# Patient Record
Sex: Female | Born: 1965 | Race: Black or African American | Hispanic: No | Marital: Single | Smoking: Never smoker
Health system: Southern US, Community
[De-identification: ages and names within clinical notes are randomized; demographics above are authoritative.]

## PROBLEM LIST (undated history)

## (undated) DIAGNOSIS — E119 Type 2 diabetes mellitus without complications: Secondary | ICD-10-CM

## (undated) DIAGNOSIS — D649 Anemia, unspecified: Secondary | ICD-10-CM

## (undated) DIAGNOSIS — C801 Malignant (primary) neoplasm, unspecified: Secondary | ICD-10-CM

## (undated) HISTORY — PX: CHOLECYSTECTOMY: SHX55

---

## 2018-05-13 ENCOUNTER — Ambulatory Visit (INDEPENDENT_AMBULATORY_CARE_PROVIDER_SITE_OTHER): Payer: Self-pay | Admitting: Family Medicine

## 2018-06-04 ENCOUNTER — Encounter (INDEPENDENT_AMBULATORY_CARE_PROVIDER_SITE_OTHER): Payer: Self-pay | Admitting: Primary Care

## 2018-06-04 ENCOUNTER — Ambulatory Visit (INDEPENDENT_AMBULATORY_CARE_PROVIDER_SITE_OTHER): Payer: Self-pay | Admitting: Primary Care

## 2018-06-04 ENCOUNTER — Other Ambulatory Visit: Payer: Self-pay

## 2018-06-04 VITALS — BP 134/75 | HR 96 | Temp 101.8°F | Wt 322.2 lb

## 2018-06-04 DIAGNOSIS — E119 Type 2 diabetes mellitus without complications: Secondary | ICD-10-CM

## 2018-06-04 DIAGNOSIS — J069 Acute upper respiratory infection, unspecified: Secondary | ICD-10-CM

## 2018-06-04 DIAGNOSIS — J320 Chronic maxillary sinusitis: Secondary | ICD-10-CM

## 2018-06-04 DIAGNOSIS — I10 Essential (primary) hypertension: Secondary | ICD-10-CM

## 2018-06-04 DIAGNOSIS — E785 Hyperlipidemia, unspecified: Secondary | ICD-10-CM

## 2018-06-04 LAB — POCT GLYCOSYLATED HEMOGLOBIN (HGB A1C): Hemoglobin A1C: 6.8 % — AB (ref 4.0–5.6)

## 2018-06-04 LAB — GLUCOSE, POCT (MANUAL RESULT ENTRY): POC Glucose: 121 mg/dl — AB (ref 70–99)

## 2018-06-04 MED ORDER — METFORMIN HCL ER 500 MG PO TB24: 500.0000 mg | ORAL_TABLET | Freq: Two times a day (BID) | ORAL | 3 refills | Status: DC

## 2018-06-04 MED ORDER — LOSARTAN POTASSIUM 25 MG PO TABS: 25.0000 mg | ORAL_TABLET | Freq: Every day | ORAL | 3 refills | Status: DC

## 2018-06-04 MED ORDER — AZITHROMYCIN 250 MG PO TABS: ORAL_TABLET | ORAL | 0 refills | Status: DC

## 2018-06-04 MED ORDER — GLIPIZIDE ER 5 MG PO TB24: 5.0000 mg | ORAL_TABLET | Freq: Two times a day (BID) | ORAL | 3 refills | Status: DC

## 2018-06-04 MED ORDER — SIMVASTATIN 20 MG PO TABS: 20.0000 mg | ORAL_TABLET | Freq: Every day | ORAL | 3 refills | Status: DC

## 2018-06-04 NOTE — Progress Notes (Signed)
Acute Office Visit  Subjective:    Patient ID: Tammy Marks, female    DOB: 1965-04-13, 53 y.o.   MRN: [id number]  Chief Complaint  Patient presents with  . New Patient (Initial Visit)    HPI. Tammy Marks is in today to establish care. She presents with fever 101.8 , cold, cough and fatigue. She denies being around anyone that has been sick. These s/s started 3 days ago. Likely viral infection upper respiratory or sinus. Past medical hx, T2D, morbid obesity , HTN, and hyperlipidemia.        No past medical history on file.    No family history on file.  Social History   Socioeconomic History  . Marital status: Single    Spouse name: Not on file  . Number of children: Not on file  . Years of education: Not on file  . Highest education level: Not on file  Occupational History  . Not on file  Social Needs  . Financial resource strain: Not on file  . Food insecurity:    Worry: Not on file    Inability: Not on file  . Transportation needs:    Medical: Not on file    Non-medical: Not on file  Tobacco Use  . Smoking status: Never Smoker  . Smokeless tobacco: Never Used  Substance and Sexual Activity  . Alcohol use: Not Currently  . Drug use: Never  . Sexual activity: Not on file  Lifestyle  . Physical activity:    Days per week: Not on file    Minutes per session: Not on file  . Stress: Not on file  Relationships  . Social connections:    Talks on phone: Not on file    Gets together: Not on file    Attends religious service: Not on file    Active member of club or organization: Not on file    Attends meetings of clubs or organizations: Not on file    Relationship status: Not on file  . Intimate partner violence:    Fear of current or ex partner: Not on file    Emotionally abused: Not on file    Physically abused: Not on file    Forced sexual activity: Not on file  Other Topics Concern  . Not on file  Social History Narrative  . Not on file     Outpatient Medications Prior to Visit  Medication Sig Dispense Refill  . losartan (COZAAR) 25 MG tablet Take 25 mg by mouth daily.    . simvastatin (ZOCOR) 20 MG tablet Take 20 mg by mouth at bedtime.    Marland Kitchen glipiZIDE (GLUCOTROL XL) 5 MG 24 hr tablet Take 5 mg by mouth 2 (two) times daily.    . metFORMIN (GLUCOPHAGE-XR) 500 MG 24 hr tablet Take 500 mg by mouth 2 (two) times daily.     No facility-administered medications prior to visit.     No Known Allergies  Review of Systems  Constitutional: Positive for chills and fever.  HENT: Positive for congestion and sinus pain.   Eyes: Negative.   Respiratory: Positive for cough and sputum production.   Cardiovascular: Negative.   Gastrointestinal: Negative.   Genitourinary: Positive for urgency.  Musculoskeletal: Negative.   Skin: Negative.   Neurological: Positive for weakness and headaches.  Endo/Heme/Allergies: Negative.   Psychiatric/Behavioral: The patient has insomnia.        Objective:    Physical Exam  Constitutional: She is oriented to person, place, and time. She  appears well-developed and well-nourished.  HENT:  Head: Normocephalic and atraumatic.  Tender bilateral maxillary and  cervical chain lymphoids   Eyes: Pupils are equal, round, and reactive to light. EOM are normal.  Neck: Normal range of motion. Neck supple.  Snores   Cardiovascular: Normal rate and regular rhythm.  Pulmonary/Chest: Effort normal and breath sounds normal.  Abdominal: Soft. Bowel sounds are normal.  Musculoskeletal: Normal range of motion.  Neurological: She is alert and oriented to person, place, and time.  Skin: Skin is warm and dry.  Psychiatric: She has a normal mood and affect.    BP 134/75 (BP Location: Left Arm, Patient Position: Sitting, Cuff Size: Large)   Pulse 96   Temp (!) 101.8 F (38.8 C) (Oral)   Wt (!) 322 lb 3.2 oz (146.1 kg)   LMP 05/05/2018 (Exact Date)   SpO2 94%  Wt Readings from Last 3 Encounters:   06/04/18 (!) 322 lb 3.2 oz (146.1 kg)    Health Maintenance Due  Topic Date Due  . HIV Screening  02/03/1983  . TETANUS/TDAP  02/03/1987  . PAP SMEAR-Modifier  02/02/1989  . MAMMOGRAM  02/02/2018  . COLONOSCOPY  02/02/2018    There are no preventive care reminders to display for this patient.  Lab Results  Component Value Date   HGBA1C 6.8 (A) 06/04/2018       Assessment & Plan:  Tammy Marks was seen today for new patient (initial visit).  Diagnoses and all orders for this visit:  Type 2 diabetes mellitus without complication, without long-term current use of insulin (HCC) -     HgB A1c -     Glucose (CBG) -     glipiZIDE (GLUCOTROL XL) 5 MG 24 hr tablet; Take 1 tablet (5 mg total) by mouth 2 (two) times daily for 30 days. -     metFORMIN (GLUCOPHAGE-XR) 500 MG 24 hr tablet; Take 1 tablet (500 mg total) by mouth 2 (two) times daily for 30 days.  Essential hypertension -     glipiZIDE (GLUCOTROL XL) 5 MG 24 hr tablet; Take 1 tablet (5 mg total) by mouth 2 (two) times daily for 30 days. -     losartan (COZAAR) 25 MG tablet; Take 1 tablet (25 mg total) by mouth daily for 30 days.  Hyperlipidemia, unspecified hyperlipidemia type -     simvastatin (ZOCOR) 20 MG tablet; Take 1 tablet (20 mg total) by mouth at bedtime for 30 days.  Acute upper respiratory infection  Morbid obesity (HCC)  Chronic maxillary sinusitis -     azithromycin (ZITHROMAX) 250 MG tablet; Take 2 tablets 1st day than for the following 5 days take 1 tablet   Problem List Items Addressed This Visit    None    Visit Diagnoses    Type 2 diabetes mellitus without complication, without long-term current use of insulin (HCC)    -  Primary A1C 6.8 controlled on oral agents refill    Relevant Medications   losartan (COZAAR) 25 MG tablet   simvastatin (ZOCOR) 20 MG tablet   metFORMIN (GLUCOPHAGE-XR) 500 MG 24 hr tablet   glipiZIDE (GLUCOTROL XL) 5 MG 24 hr tablet   Other Relevant Orders   HgB A1c (Completed)    Glucose (CBG) (Completed)  t:Essential hypertension currently on and forgot to take prior to a-     losartan (COZAAR) 25 MG tablet; Take 1 tablet (25 mg total) by mouth daily for 30 days.  Hyperlipidemia, unspecified hyperlipidemia type -  simvastatin (ZOCOR) 20 MG tablet; Take 1 tablet (20 mg total) by mouth at bedtime for 30 days.  Acute upper respiratory infection : lungs CTA congestion fever and tender swollen lymphoids   Morbid obesity (HCC) BMI> 40 discuss on return visit how we can approach weight loss  Chronic maxillary sinusitis -     azithromycin (ZITHROMAX) 250 MG tablet; Take 2 tablets 1st day than for the following 5 days take 1 tablet      Meds ordered this encounter  Medications  . glipiZIDE (GLUCOTROL XL) 5 MG 24 hr tablet    Sig: Take 1 tablet (5 mg total) by mouth 2 (two) times daily for 30 days.    Dispense:  60 tablet    Refill:  3  . losartan (COZAAR) 25 MG tablet    Sig: Take 1 tablet (25 mg total) by mouth daily for 30 days.    Dispense:  30 tablet    Refill:  3  . metFORMIN (GLUCOPHAGE-XR) 500 MG 24 hr tablet    Sig: Take 1 tablet (500 mg total) by mouth 2 (two) times daily for 30 days.    Dispense:  60 tablet    Refill:  3  . simvastatin (ZOCOR) 20 MG tablet    Sig: Take 1 tablet (20 mg total) by mouth at bedtime for 30 days.    Dispense:  30 tablet    Refill:  3  . azithromycin (ZITHROMAX) 250 MG tablet    Sig: Take 2 tablets 1st day than for the following 5 days take 1 tablet    Dispense:  6 tablet    Refill:  0     Grayce Sessions, NP

## 2018-06-04 NOTE — Patient Instructions (Signed)

## 2018-07-02 ENCOUNTER — Ambulatory Visit (INDEPENDENT_AMBULATORY_CARE_PROVIDER_SITE_OTHER): Payer: Self-pay | Admitting: Primary Care

## 2018-07-24 ENCOUNTER — Telehealth: Payer: Self-pay | Admitting: *Deleted

## 2018-07-24 NOTE — Telephone Encounter (Signed)
Medical Assistant left message on patient's home and cell voicemail. Voicemail states to give a call back to [place] with [facility] at [phone]. Patient is to be a tele visit. Patient had A1C in February.

## 2018-07-28 ENCOUNTER — Ambulatory Visit: Payer: Self-pay | Attending: Primary Care | Admitting: Primary Care

## 2018-07-28 ENCOUNTER — Ambulatory Visit (INDEPENDENT_AMBULATORY_CARE_PROVIDER_SITE_OTHER): Payer: Self-pay | Admitting: Primary Care

## 2018-07-28 ENCOUNTER — Encounter: Payer: Self-pay | Admitting: Primary Care

## 2018-07-28 ENCOUNTER — Other Ambulatory Visit: Payer: Self-pay

## 2018-07-28 DIAGNOSIS — E785 Hyperlipidemia, unspecified: Secondary | ICD-10-CM

## 2018-07-28 DIAGNOSIS — E119 Type 2 diabetes mellitus without complications: Secondary | ICD-10-CM

## 2018-07-28 DIAGNOSIS — I10 Essential (primary) hypertension: Secondary | ICD-10-CM

## 2018-07-28 MED ORDER — GLIPIZIDE ER 5 MG PO TB24: 5.0000 mg | ORAL_TABLET | Freq: Two times a day (BID) | ORAL | 3 refills | Status: DC

## 2018-07-28 MED ORDER — METFORMIN HCL ER 500 MG PO TB24: 500.0000 mg | ORAL_TABLET | Freq: Two times a day (BID) | ORAL | 3 refills | Status: DC

## 2018-07-28 MED ORDER — LOSARTAN POTASSIUM 25 MG PO TABS: 25.0000 mg | ORAL_TABLET | Freq: Every day | ORAL | 3 refills | Status: DC

## 2018-07-28 MED ORDER — SIMVASTATIN 20 MG PO TABS: 20.0000 mg | ORAL_TABLET | Freq: Every day | ORAL | 3 refills | Status: DC

## 2018-07-28 NOTE — Progress Notes (Signed)
Established Patient Office Visit  Subjective: HP  Patient ID: Tammy Marks, female    DOB: Aug 30, 1965  Age: 53 y.o. MRN   HPI: Tammy Marks is following up with DM,HTN and hyperlipidemia.  She will be in for labs in the near future. She voices no problems or concerns. I did mentioned to be safe when in public wear mask and use social distancing.  History reviewed. No pertinent past medical history.  History reviewed. No pertinent surgical history.  History reviewed. No pertinent family history.  Social History   Socioeconomic History  . Marital status: Single    Spouse name: Not on file  . Number of children: Not on file  . Years of education: Not on file  . Highest education level: Not on file  Occupational History  . Not on file  Social Needs  . Financial resource strain: Not on file  . Food insecurity:    Worry: Not on file    Inability: Not on file  . Transportation needs:    Medical: Not on file    Non-medical: Not on file  Tobacco Use  . Smoking status: Never Smoker  . Smokeless tobacco: Never Used  Substance and Sexual Activity  . Alcohol use: Not Currently  . Drug use: Never  . Sexual activity: Not Currently  Lifestyle  . Physical activity:    Days per week: Not on file    Minutes per session: Not on file  . Stress: Not on file  Relationships  . Social connections:    Talks on phone: Not on file    Gets together: Not on file    Attends religious service: Not on file    Active member of club or organization: Not on file    Attends meetings of clubs or organizations: Not on file    Relationship status: Not on file  . Intimate partner violence:    Fear of current or ex partner: Not on file    Emotionally abused: Not on file    Physically abused: Not on file    Forced sexual activity: Not on file  Other Topics Concern  . Not on file  Social History Narrative  . Not on file    Outpatient Medications Prior to Visit  Medication Sig Dispense  Refill  . glipiZIDE (GLUCOTROL XL) 5 MG 24 hr tablet Take 1 tablet (5 mg total) by mouth 2 (two) times daily for 30 days. 60 tablet 3  . losartan (COZAAR) 25 MG tablet Take 1 tablet (25 mg total) by mouth daily for 30 days. 30 tablet 3  . metFORMIN (GLUCOPHAGE-XR) 500 MG 24 hr tablet Take 1 tablet (500 mg total) by mouth 2 (two) times daily for 30 days. 60 tablet 3  . simvastatin (ZOCOR) 20 MG tablet Take 1 tablet (20 mg total) by mouth at bedtime for 30 days. 30 tablet 3  . azithromycin (ZITHROMAX) 250 MG tablet Take 2 tablets 1st day than for the following 5 days take 1 tablet 6 tablet 0   No facility-administered medications prior to visit.     No Known Allergies  ROS Review of Systems    Objective:    Physical Exam  LMP 06/27/2018  Wt Readings from Last 3 Encounters:  06/04/18 (!) 322 lb 3.2 oz (146.1 kg)     Health Maintenance Due  Topic Date Due  . URINE MICROALBUMIN  02/02/1978  . HIV Screening  02/03/1983  . TETANUS/TDAP  02/03/1987  . PAP SMEAR-Modifier  02/02/1989  .  MAMMOGRAM  02/02/2018  . COLONOSCOPY  02/02/2018    There are no preventive care reminders to display for this patient.  No results found for: TSH No results found for: WBC, HGB, HCT, MCV, PLT No results found for: NA, K, CHLORIDE, CO2, GLUCOSE, BUN, CREATININE, BILITOT, ALKPHOS, AST, ALT, PROT, ALBUMIN, CALCIUM, ANIONGAP, EGFR, GFR No results found for: CHOL No results found for: HDL No results found for: LDLCALC No results found for: TRIG No results found for: CHOLHDL Lab Results  Component Value Date   HGBA1C 6.8 (A) 06/04/2018      Assessment & Plan:   Problem List Items Addressed This Visit    None      No orders of the defined types were placed in this encounter.   Follow-up: No follow-ups on file.    Kerin Perna, NP

## 2018-07-28 NOTE — Progress Notes (Signed)
Virtual Visit via Telephone Note  I connected with Tammy Marks on 07/28/18 at 8:40 by telephone and verified that I am speaking with the correct person using two identifiers.   I discussed the limitations, risks, security and privacy concerns of performing an evaluation and management service by telephone and the availability of in person appointments. I also discussed with the patient that there may be a patient responsible charge related to this service. The patient expressed understanding and agreed to proceed.  History of Present Illness: Tammy Marks is a follow up for on  Diabetes management . She voices no concerns or complaints. She is currently on oral diabetic medication.   Observations/Objective: Review of Systems  Constitutional: Negative.   HENT: Negative.   Eyes: Negative.   Respiratory: Negative.   Cardiovascular: Negative.   Gastrointestinal: Negative.   Genitourinary: Negative.   Musculoskeletal: Negative.   Skin: Negative.   Neurological: Negative.   Endo/Heme/Allergies: Negative.   Psychiatric/Behavioral: Negative.     Assessment and Plan:  Tammy Marks was seen today for follow-up.  Diagnoses and all orders for this visit:  Type 2 diabetes mellitus without complication, without long-term current use of insulin (HCC) Diabetes  She presents for her follow-up diabetic visit. She has type 2 diabetes mellitus. No MedicAlert identification noted. There are no hypoglycemic associated symptoms. There are no diabetic associated symptoms. There are no hypoglycemic complications. Symptoms are improving. There are no diabetic complications. Risk factors for coronary artery disease include diabetes mellitus, dyslipidemia and hypertension. Current diabetic treatment includes diet and oral agent (dual therapy). She is compliant with treatment all of the time. Her weight is stable. She is following a diabetic, low fat/cholesterol and low salt diet. Meal planning includes carbohydrate  counting. She has not had a previous visit with a dietitian. She participates in exercise intermittently. There is no change in her home blood glucose trend. An ACE inhibitor/angiotensin II receptor blocker is being taken. She does not see a podiatrist.Eye exam is not current.  -     glipiZIDE (GLUCOTROL XL) 5 MG 24 hr tablet; Take 1 tablet (5 mg total) by mouth 2 (two) times daily for 30 days. -     metFORMIN (GLUCOPHAGE-XR) 500 MG 24 hr tablet; Take 1 tablet (500 mg total) by mouth 2 (two) times daily for 30 days.  Essential hypertension On f/u office visit will evaluate Bp . Cont. Current medication for Bp and renal protection. --     losartan (COZAAR) 25 MG tablet; Take 1 tablet (25 mg total) by mouth daily for 30 days.  Hyperlipidemia, unspecified hyperlipidemia type She will need labs cont  -     simvastatin (ZOCOR) 20 MG tablet; Take 1 tablet (20 mg total) by mouth at bedtime for 30 days.     Follow Up Instructions:    I discussed the assessment and treatment plan with the patient. The patient was provided an opportunity to ask questions and all were answered. The patient agreed with the plan and demonstrated an understanding of the instructions.   The patient was advised to call back or seek an in-person evaluation if the symptoms worsen or if the condition fails to improve as anticipated.  I provided 22 minutes of non-face-to-face time during this encounter.   Grayce Sessions, NP

## 2018-07-28 NOTE — Progress Notes (Signed)
Patient verified DOB Patient has not taken medication today. Patient has eaten today.  Patient denies pain at this time. Patient has not checked her BP or CBG this morning

## 2019-01-01 ENCOUNTER — Other Ambulatory Visit: Payer: Self-pay

## 2019-01-01 DIAGNOSIS — Z20822 Contact with and (suspected) exposure to covid-19: Secondary | ICD-10-CM

## 2019-01-02 LAB — NOVEL CORONAVIRUS, NAA: SARS-CoV-2, NAA: NOT DETECTED

## 2019-02-12 ENCOUNTER — Encounter (INDEPENDENT_AMBULATORY_CARE_PROVIDER_SITE_OTHER): Payer: Self-pay | Admitting: Primary Care

## 2019-02-12 ENCOUNTER — Other Ambulatory Visit (INDEPENDENT_AMBULATORY_CARE_PROVIDER_SITE_OTHER): Payer: Self-pay | Admitting: Primary Care

## 2019-02-12 ENCOUNTER — Other Ambulatory Visit: Payer: Self-pay

## 2019-02-12 ENCOUNTER — Ambulatory Visit (INDEPENDENT_AMBULATORY_CARE_PROVIDER_SITE_OTHER): Payer: BC Managed Care – PPO | Admitting: Primary Care

## 2019-02-12 VITALS — BP 125/85 | HR 94 | Temp 96.2°F | Ht 65.0 in | Wt 320.6 lb

## 2019-02-12 DIAGNOSIS — Z23 Encounter for immunization: Secondary | ICD-10-CM

## 2019-02-12 DIAGNOSIS — I10 Essential (primary) hypertension: Secondary | ICD-10-CM

## 2019-02-12 DIAGNOSIS — Z114 Encounter for screening for human immunodeficiency virus [HIV]: Secondary | ICD-10-CM

## 2019-02-12 DIAGNOSIS — E669 Obesity, unspecified: Secondary | ICD-10-CM

## 2019-02-12 DIAGNOSIS — Z1211 Encounter for screening for malignant neoplasm of colon: Secondary | ICD-10-CM

## 2019-02-12 DIAGNOSIS — Z1231 Encounter for screening mammogram for malignant neoplasm of breast: Secondary | ICD-10-CM

## 2019-02-12 DIAGNOSIS — E119 Type 2 diabetes mellitus without complications: Secondary | ICD-10-CM | POA: Diagnosis not present

## 2019-02-12 DIAGNOSIS — E1165 Type 2 diabetes mellitus with hyperglycemia: Secondary | ICD-10-CM

## 2019-02-12 DIAGNOSIS — Z6841 Body Mass Index (BMI) 40.0 and over, adult: Secondary | ICD-10-CM

## 2019-02-12 LAB — POCT GLYCOSYLATED HEMOGLOBIN (HGB A1C): Hemoglobin A1C: 8.9 % — AB (ref 4.0–5.6)

## 2019-02-12 LAB — GLUCOSE, POCT (MANUAL RESULT ENTRY): POC Glucose: 194 mg/dl — AB (ref 70–99)

## 2019-02-12 MED ORDER — LOSARTAN POTASSIUM 25 MG PO TABS: 25.0000 mg | ORAL_TABLET | Freq: Every day | ORAL | 3 refills | Status: DC

## 2019-02-12 MED ORDER — GLIPIZIDE ER 10 MG PO TB24: 10.0000 mg | ORAL_TABLET | Freq: Two times a day (BID) | ORAL | 3 refills | Status: DC

## 2019-02-12 MED ORDER — METFORMIN HCL ER 500 MG PO TB24: ORAL_TABLET | ORAL | 3 refills | Status: DC

## 2019-02-12 NOTE — Progress Notes (Signed)
Established Patient Office Visit  Subjective:  Patient ID: Tammy Marks, female    DOB: [birth year]  Age: 53 y.o. MRN: [id number]  CC:  Chief Complaint  Patient presents with  . Diabetes    HPI Tammy Marks presents for management of diabetes,hypertension well controlled she denies shortness of breath, headaches, chest pain or lower extremity edema.   History reviewed. No pertinent past medical history.  History reviewed. No pertinent surgical history.  History reviewed. No pertinent family history.  Social History   Socioeconomic History  . Marital status: Single    Spouse name: Not on file  . Number of children: Not on file  . Years of education: Not on file  . Highest education level: Not on file  Occupational History  . Not on file  Social Needs  . Financial resource strain: Not on file  . Food insecurity    Worry: Not on file    Inability: Not on file  . Transportation needs    Medical: Not on file    Non-medical: Not on file  Tobacco Use  . Smoking status: Never Smoker  . Smokeless tobacco: Never Used  Substance and Sexual Activity  . Alcohol use: Not Currently  . Drug use: Never  . Sexual activity: Not Currently  Lifestyle  . Physical activity    Days per week: Not on file    Minutes per session: Not on file  . Stress: Not on file  Relationships  . Social Herbalist on phone: Not on file    Gets together: Not on file    Attends religious service: Not on file    Active member of club or organization: Not on file    Attends meetings of clubs or organizations: Not on file    Relationship status: Not on file  . Intimate partner violence    Fear of current or ex partner: Not on file    Emotionally abused: Not on file    Physically abused: Not on file    Forced sexual activity: Not on file  Other Topics Concern  . Not on file  Social History Narrative  . Not on file    Outpatient Medications Prior to Visit  Medication Sig Dispense  Refill  . glipiZIDE (GLUCOTROL XL) 5 MG 24 hr tablet Take 1 tablet (5 mg total) by mouth 2 (two) times daily for 30 days. 60 tablet 3  . simvastatin (ZOCOR) 20 MG tablet Take 1 tablet (20 mg total) by mouth at bedtime for 30 days. 30 tablet 3  . losartan (COZAAR) 25 MG tablet Take 1 tablet (25 mg total) by mouth daily for 30 days. 30 tablet 3  . metFORMIN (GLUCOPHAGE-XR) 500 MG 24 hr tablet Take 1 tablet (500 mg total) by mouth 2 (two) times daily for 30 days. 60 tablet 3   No facility-administered medications prior to visit.     No Known Allergies  ROS Review of Systems  All other systems reviewed and are negative.     Objective:    Physical Exam  Constitutional: She is oriented to person, place, and time. She appears well-developed and well-nourished.  HENT:  Head: Normocephalic.  Eyes: Pupils are equal, round, and reactive to light. EOM are normal.  Neck: Normal range of motion. Neck supple.  Cardiovascular: Normal rate and regular rhythm.  Pulmonary/Chest: Effort normal and breath sounds normal.  Abdominal: Soft. Bowel sounds are normal.  Musculoskeletal: Normal range of motion.  Neurological: She is oriented  to person, place, and time.  Skin: Skin is warm and dry.  Psychiatric: She has a normal mood and affect. Her behavior is normal.    BP 125/85 (BP Location: Left Arm, Patient Position: Sitting, Cuff Size: Large)   Pulse 94   Temp (!) 96.2 F (35.7 C) (Temporal)   Ht 5' 5" (1.651 m)   Wt (!) 320 lb 9.6 oz (145.4 kg)   LMP 01/24/2019 (Approximate)   SpO2 97%   BMI 53.35 kg/m  Wt Readings from Last 3 Encounters:  02/12/19 (!) 320 lb 9.6 oz (145.4 kg)  06/04/18 (!) 322 lb 3.2 oz (146.1 kg)     Health Maintenance Due  Topic Date Due  . URINE MICROALBUMIN  02/02/1978  . HIV Screening  02/03/1983  . TETANUS/TDAP  02/03/1987  . PAP SMEAR-Modifier  02/02/1989  . MAMMOGRAM  02/02/2018  . COLONOSCOPY  02/02/2018  . INFLUENZA VACCINE  11/08/2018    There are  no preventive care reminders to display for this patient.  No results found for: TSH No results found for: WBC, HGB, HCT, MCV, PLT No results found for: NA, K, CHLORIDE, CO2, GLUCOSE, BUN, CREATININE, BILITOT, ALKPHOS, AST, ALT, PROT, ALBUMIN, CALCIUM, ANIONGAP, EGFR, GFR No results found for: CHOL No results found for: HDL No results found for: LDLCALC No results found for: TRIG No results found for: [facility] Lab Results  Component Value Date   HGBA1C 8.9 (A) 02/12/2019      Assessment & Plan:  Tammy Marks was seen today for diabetes.  Diagnoses and all orders for this visit:  Type 2 diabetes mellitus without complication, without long-term current use of insulin (HCC) A1C is increasing now 8.9 was 6.8 Increased metformin XR 524m (2) in AM and (2) pm after meals Increased Glucotrol to 564mto  1011mwice a day after meals. Discussed foods that are high in carbohydrates are the following rice, potatoes, breads, sugars, and pastas.  Reduction in the intake (eating) will assist in lowering your blood sugars. -     HgB A1c -     Glucose (CBG) -     Microalbumin/Creatinine Ratio, Urine -     metFORMIN (GLUCOPHAGE-XR) 500 MG 24 hr tablet; Take (2) 500m28m after breakfast and dinner -     Cancel: Complete Metabolic Panel with GFR -     Lipid Panel -     Complete Metabolic Panel with GFR -     CBC with Differential  Need for Tdap vaccination -     Tdap vaccine greater than or equal to 7yo IM  Special screening for malignant neoplasms, colon -     Fecal occult blood, imunochemical; Future  Need for immunization against influenza -     Flu Vaccine QUAD 36+ mos IM  Screening for HIV (human immunodeficiency virus) -     HIV antibody (with reflex)  Encounter for screening mammogram for malignant neoplasm of breast -     MM Digital Diagnostic Bilat; Future     Meds ordered this encounter  Medications  . metFORMIN (GLUCOPHAGE-XR) 500 MG 24 hr tablet    Sig: Take (2) 500mg16m after breakfast and dinner    Dispense:  120 tablet    Refill:  3  . losartan (COZAAR) 25 MG tablet    Sig: Take 1 tablet (25 mg total) by mouth daily.    Dispense:  30 tablet    Refill:  3    Follow-up: Return in about 3 months (around 05/15/2019)  for DM, .    Kerin Perna, NP

## 2019-02-12 NOTE — Patient Instructions (Signed)
Diabetes Mellitus and Exercise Exercising regularly is important for your overall health, especially when you have diabetes (diabetes mellitus). Exercising is not only about losing weight. It has many other health benefits, such as increasing muscle strength and bone density and reducing body fat and stress. This leads to improved fitness, flexibility, and endurance, all of which result in better overall health. Exercise has additional benefits for people with diabetes, including:  Reducing appetite.  Helping to lower and control blood glucose.  Lowering blood pressure.  Helping to control amounts of fatty substances (lipids) in the blood, such as cholesterol and triglycerides.  Helping the body to respond better to insulin (improving insulin sensitivity).  Reducing how much insulin the body needs.  Decreasing the risk for heart disease by: ? Lowering cholesterol and triglyceride levels. ? Increasing the levels of good cholesterol. ? Lowering blood glucose levels. What is my activity plan? Your health care provider or certified diabetes educator can help you make a plan for the type and frequency of exercise (activity plan) that works for you. Make sure that you:  Do at least 150 minutes of moderate-intensity or vigorous-intensity exercise each week. This could be brisk walking, biking, or water aerobics. ? Do stretching and strength exercises, such as yoga or weightlifting, at least 2 times a week. ? Spread out your activity over at least 3 days of the week.  Get some form of physical activity every day. ? Do not go more than 2 days in a row without some kind of physical activity. ? Avoid being inactive for more than 30 minutes at a time. Take frequent breaks to walk or stretch.  Choose a type of exercise or activity that you enjoy, and set realistic goals.  Start slowly, and gradually increase the intensity of your exercise over time. What do I need to know about managing my  diabetes?   Check your blood glucose before and after exercising. ? If your blood glucose is 240 mg/dL (13.3 mmol/L) or higher before you exercise, check your urine for ketones. If you have ketones in your urine, do not exercise until your blood glucose returns to normal. ? If your blood glucose is 100 mg/dL (5.6 mmol/L) or lower, eat a snack containing 15-20 grams of carbohydrate. Check your blood glucose 15 minutes after the snack to make sure that your level is above 100 mg/dL (5.6 mmol/L) before you start your exercise.  Know the symptoms of low blood glucose (hypoglycemia) and how to treat it. Your risk for hypoglycemia increases during and after exercise. Common symptoms of hypoglycemia can include: ? Hunger. ? Anxiety. ? Sweating and feeling clammy. ? Confusion. ? Dizziness or feeling light-headed. ? Increased heart rate or palpitations. ? Blurry vision. ? Tingling or numbness around the mouth, lips, or tongue. ? Tremors or shakes. ? Irritability.  Keep a rapid-acting carbohydrate snack available before, during, and after exercise to help prevent or treat hypoglycemia.  Avoid injecting insulin into areas of the body that are going to be exercised. For example, avoid injecting insulin into: ? The arms, when playing tennis. ? The legs, when jogging.  Keep records of your exercise habits. Doing this can help you and your health care provider adjust your diabetes management plan as needed. Write down: ? Food that you eat before and after you exercise. ? Blood glucose levels before and after you exercise. ? The type and amount of exercise you have done. ? When your insulin is expected to peak, if you use   insulin. Avoid exercising at times when your insulin is peaking.  When you start a new exercise or activity, work with your health care provider to make sure the activity is safe for you, and to adjust your insulin, medicines, or food intake as needed.  Drink plenty of water while  you exercise to prevent dehydration or heat stroke. Drink enough fluid to keep your urine clear or pale yellow. Summary  Exercising regularly is important for your overall health, especially when you have diabetes (diabetes mellitus).  Exercising has many health benefits, such as increasing muscle strength and bone density and reducing body fat and stress.  Your health care provider or certified diabetes educator can help you make a plan for the type and frequency of exercise (activity plan) that works for you.  When you start a new exercise or activity, work with your health care provider to make sure the activity is safe for you, and to adjust your insulin, medicines, or food intake as needed. This information is not intended to replace advice given to you by your health care provider. Make sure you discuss any questions you have with your health care provider. Document Released: 06/16/2003 Document Revised: 10/18/2016 Document Reviewed: 09/05/2015 Elsevier Patient Education  2020 [employer].  

## 2019-02-13 ENCOUNTER — Other Ambulatory Visit (INDEPENDENT_AMBULATORY_CARE_PROVIDER_SITE_OTHER): Payer: Self-pay | Admitting: Primary Care

## 2019-02-13 LAB — CBC WITH DIFFERENTIAL/PLATELET
Basophils Absolute: 0.1 10*3/uL (ref 0.0–0.2)
Basos: 1 %
EOS (ABSOLUTE): 0.2 10*3/uL (ref 0.0–0.4)
Eos: 2 %
Hematocrit: 33.4 % — ABNORMAL LOW (ref 34.0–46.6)
Hemoglobin: 10.4 g/dL — ABNORMAL LOW (ref 11.1–15.9)
Immature Grans (Abs): 0 10*3/uL (ref 0.0–0.1)
Immature Granulocytes: 0 %
Lymphocytes Absolute: 3.5 10*3/uL — ABNORMAL HIGH (ref 0.7–3.1)
Lymphs: 39 %
MCH: 22.2 pg — ABNORMAL LOW (ref 26.6–33.0)
MCHC: 31.1 g/dL — ABNORMAL LOW (ref 31.5–35.7)
MCV: 71 fL — ABNORMAL LOW (ref 79–97)
Monocytes Absolute: 0.7 10*3/uL (ref 0.1–0.9)
Monocytes: 8 %
Neutrophils Absolute: 4.5 10*3/uL (ref 1.4–7.0)
Neutrophils: 50 %
Platelets: 521 10*3/uL — ABNORMAL HIGH (ref 150–450)
RBC: 4.68 x10E6/uL (ref 3.77–5.28)
RDW: 16 % — ABNORMAL HIGH (ref 11.7–15.4)
WBC: 9 10*3/uL (ref 3.4–10.8)

## 2019-02-13 LAB — LIPID PANEL
Chol/HDL Ratio: 3.2 ratio (ref 0.0–4.4)
Cholesterol, Total: 161 mg/dL (ref 100–199)
HDL: 50 mg/dL (ref >39)
LDL Chol Calc (NIH): 92 mg/dL (ref 0–99)
Triglycerides: 104 mg/dL (ref 0–149)
VLDL Cholesterol Cal: 19 mg/dL (ref 5–40)

## 2019-02-13 LAB — HIV ANTIBODY (ROUTINE TESTING W REFLEX): HIV Screen 4th Generation wRfx: NONREACTIVE

## 2019-02-13 LAB — MICROALBUMIN / CREATININE URINE RATIO
Creatinine, Urine: 277.3 mg/dL
Microalb/Creat Ratio: 7 mg/g creat (ref 0–29)
Microalbumin, Urine: 18.3 ug/mL

## 2019-02-13 MED ORDER — IRON (FERROUS SULFATE) 325 (65 FE) MG PO TABS: 325.0000 mg | ORAL_TABLET | Freq: Every day | ORAL | 11 refills | Status: AC

## 2019-02-13 MED ORDER — SENNA 8.6 MG PO TABS: 1.0000 | ORAL_TABLET | Freq: Every day | ORAL | 0 refills | Status: DC

## 2019-02-17 ENCOUNTER — Ambulatory Visit
Admission: RE | Admit: 2019-02-17 | Discharge: 2019-02-17 | Disposition: A | Discharge status: Home / Self Care | Payer: BC Managed Care – PPO | Source: Ambulatory Visit | Attending: Primary Care | Admitting: Primary Care

## 2019-02-17 ENCOUNTER — Other Ambulatory Visit: Payer: Self-pay

## 2019-02-17 DIAGNOSIS — Z1231 Encounter for screening mammogram for malignant neoplasm of breast: Secondary | ICD-10-CM

## 2019-02-24 ENCOUNTER — Emergency Department (HOSPITAL_COMMUNITY)
Admission: EM | Admit: 2019-02-24 | Discharge: 2019-02-25 | Disposition: A | Discharge status: Home / Self Care | Payer: BC Managed Care – PPO | Attending: Emergency Medicine | Admitting: Emergency Medicine

## 2019-02-24 ENCOUNTER — Other Ambulatory Visit: Payer: Self-pay

## 2019-02-24 DIAGNOSIS — R197 Diarrhea, unspecified: Secondary | ICD-10-CM | POA: Insufficient documentation

## 2019-02-24 DIAGNOSIS — R42 Dizziness and giddiness: Secondary | ICD-10-CM | POA: Insufficient documentation

## 2019-02-24 DIAGNOSIS — R519 Headache, unspecified: Secondary | ICD-10-CM | POA: Insufficient documentation

## 2019-02-24 DIAGNOSIS — R110 Nausea: Secondary | ICD-10-CM | POA: Insufficient documentation

## 2019-02-24 DIAGNOSIS — Z7984 Long term (current) use of oral hypoglycemic drugs: Secondary | ICD-10-CM | POA: Insufficient documentation

## 2019-02-24 DIAGNOSIS — E119 Type 2 diabetes mellitus without complications: Secondary | ICD-10-CM | POA: Diagnosis not present

## 2019-02-24 DIAGNOSIS — I10 Essential (primary) hypertension: Secondary | ICD-10-CM | POA: Diagnosis not present

## 2019-02-24 DIAGNOSIS — Z20828 Contact with and (suspected) exposure to other viral communicable diseases: Secondary | ICD-10-CM | POA: Insufficient documentation

## 2019-02-24 LAB — COMPREHENSIVE METABOLIC PANEL
ALT: 14 U/L (ref 0–44)
AST: 17 U/L (ref 15–41)
Albumin: 3.2 g/dL — ABNORMAL LOW (ref 3.5–5.0)
Alkaline Phosphatase: 68 U/L (ref 38–126)
Anion gap: 11 (ref 5–15)
BUN: 7 mg/dL (ref 6–20)
CO2: 24 mmol/L (ref 22–32)
Calcium: 8.9 mg/dL (ref 8.9–10.3)
Chloride: 100 mmol/L (ref 98–111)
Creatinine, Ser: 0.74 mg/dL (ref 0.44–1.00)
GFR calc Af Amer: >60 mL/min (ref >60)
GFR calc non Af Amer: >60 mL/min (ref >60)
Glucose, Bld: 283 mg/dL — ABNORMAL HIGH (ref 70–99)
Potassium: 3.9 mmol/L (ref 3.5–5.1)
Sodium: 135 mmol/L (ref 135–145)
Total Bilirubin: 0.4 mg/dL (ref 0.3–1.2)
Total Protein: 7.1 g/dL (ref 6.5–8.1)

## 2019-02-24 LAB — URINALYSIS, ROUTINE W REFLEX MICROSCOPIC
Bilirubin Urine: NEGATIVE
Glucose, UA: >=500 mg/dL — AB
Hgb urine dipstick: NEGATIVE
Ketones, ur: NEGATIVE mg/dL
Leukocytes,Ua: NEGATIVE
Nitrite: NEGATIVE
Protein, ur: NEGATIVE mg/dL
Specific Gravity, Urine: 1.025 (ref 1.005–1.030)
pH: 5 (ref 5.0–8.0)

## 2019-02-24 LAB — CBC
HCT: 33.1 % — ABNORMAL LOW (ref 36.0–46.0)
Hemoglobin: 10.2 g/dL — ABNORMAL LOW (ref 12.0–15.0)
MCH: 22.7 pg — ABNORMAL LOW (ref 26.0–34.0)
MCHC: 30.8 g/dL (ref 30.0–36.0)
MCV: 73.6 fL — ABNORMAL LOW (ref 80.0–100.0)
Platelets: 488 10*3/uL — ABNORMAL HIGH (ref 150–400)
RBC: 4.5 MIL/uL (ref 3.87–5.11)
RDW: 16 % — ABNORMAL HIGH (ref 11.5–15.5)
WBC: 10.2 10*3/uL (ref 4.0–10.5)
nRBC: 0 % (ref 0.0–0.2)

## 2019-02-24 LAB — LIPASE, BLOOD: Lipase: 22 U/L (ref 11–51)

## 2019-02-24 MED ORDER — SODIUM CHLORIDE 0.9% FLUSH: 3.0000 mL | Freq: Once | INTRAVENOUS | Status: DC

## 2019-02-24 NOTE — ED Triage Notes (Signed)
Pt here for evaluation of nausea, diarrhea, lightheadedness, and upper abdominal pain since Sunday.

## 2019-02-25 LAB — SARS CORONAVIRUS 2 (TAT 6-24 HRS): SARS Coronavirus 2: NEGATIVE

## 2019-02-25 LAB — I-STAT BETA HCG BLOOD, ED (MC, WL, AP ONLY): I-stat hCG, quantitative: <5 m[IU]/mL (ref <5)

## 2019-02-25 MED ORDER — ONDANSETRON 4 MG PO TBDP
4.0000 mg | ORAL_TABLET | Freq: Once | ORAL | Status: AC
  Administered 2019-02-25: 4 mg via ORAL
  Filled 2019-02-25: qty 1

## 2019-02-25 MED ORDER — ONDANSETRON 4 MG PO TBDP: 4.0000 mg | ORAL_TABLET | Freq: Three times a day (TID) | ORAL | 0 refills | Status: DC | PRN

## 2019-02-25 NOTE — ED Provider Notes (Signed)
[facility] EMERGENCY DEPARTMENT Provider Note   CSN: [id number] Arrival date & time: 02/24/19  1618     History   Chief Complaint Chief Complaint  Patient presents with  . Nausea    HPI Tammy Marks is a 53 y.o. female.     Patient to ED with symptoms that started 2-3 days ago including nausea without vomiting, diarrhea with 2-3 bowel movements daily, mild sinus headache. Today she started getting lightheaded with standing. No syncope. No fever at any time. Stools have been nonbloody. She reports she has been able to eat and drink just not as much as usual. No chest pain, cough, congestion, sore throat. She has not tried anything at home for symptoms.   The history is provided by the patient. No language interpreter was used.    No past medical history on file.  Patient Active Problem List   Diagnosis Date Noted  . Essential hypertension 06/04/2018    No past surgical history on file.   OB History   No obstetric history on file.      Home Medications    Prior to Admission medications   Medication Sig Start Date End Date Taking? Authorizing Provider  glipiZIDE (GLUCOTROL XL) 10 MG 24 hr tablet Take 1 tablet (10 mg total) by mouth 2 (two) times daily. 02/12/19 03/14/19  Grayce Sessions, NP  Iron, Ferrous Sulfate, 325 (65 Fe) MG TABS Take 325 mg by mouth daily. 02/13/19   Grayce Sessions, NP  losartan (COZAAR) 25 MG tablet Take 1 tablet (25 mg total) by mouth daily. 02/12/19 03/14/19  Grayce Sessions, NP  metFORMIN (GLUCOPHAGE-XR) 500 MG 24 hr tablet Take (2) 500mg  XR after breakfast and dinner 02/12/19   13/5/20, NP  senna (SENOKOT) 8.6 MG TABS tablet Take 1 tablet (8.6 mg total) by mouth daily. 02/13/19   13/6/20, NP  simvastatin (ZOCOR) 20 MG tablet Take 1 tablet (20 mg total) by mouth at bedtime for 30 days. 07/28/18 08/27/18  08/29/18, NP    Family History No family history on file.  Social History  Social History   Tobacco Use  . Smoking status: Never Smoker  . Smokeless tobacco: Never Used  Substance Use Topics  . Alcohol use: Not Currently  . Drug use: Never     Allergies   Patient has no known allergies.   Review of Systems Review of Systems  Constitutional: Negative for chills and fever.  HENT: Positive for sinus pain (Frontal pressure headache, no congestion).   Respiratory: Negative.  Negative for cough and shortness of breath.   Cardiovascular: Negative.  Negative for chest pain.  Gastrointestinal: Positive for abdominal pain (brief episodes of epigastric 'cramping'), diarrhea and nausea. Negative for blood in stool and vomiting.  Genitourinary: Negative.   Musculoskeletal: Negative.   Skin: Negative.   Neurological: Positive for light-headedness. Negative for syncope.     Physical Exam Updated Vital Signs BP 138/66   Pulse 82   Temp 98.4 F (36.9 C) (Oral)   Resp 18   LMP 01/26/2019 (Exact Date)   SpO2 100%   Physical Exam Vitals signs and nursing note reviewed.  Constitutional:      Appearance: She is well-developed.  HENT:     Head: Normocephalic.     Mouth/Throat:     Mouth: Mucous membranes are moist.  Neck:     Musculoskeletal: Normal range of motion and neck supple.  Cardiovascular:     Rate  and Rhythm: Normal rate and regular rhythm.     Heart sounds: No murmur.  Pulmonary:     Effort: Pulmonary effort is normal.     Breath sounds: Normal breath sounds. No wheezing, rhonchi or rales.  Abdominal:     General: Bowel sounds are normal. There is no distension.     Palpations: Abdomen is soft.     Tenderness: There is no abdominal tenderness. There is no guarding or rebound.  Musculoskeletal: Normal range of motion.  Skin:    General: Skin is warm and dry.  Neurological:     Mental Status: She is alert and oriented to person, place, and time.      ED Treatments / Results  Labs (all labs ordered are listed, but only abnormal  results are displayed) Labs Reviewed  COMPREHENSIVE METABOLIC PANEL - Abnormal; Notable for the following components:      Result Value   Glucose, Bld 283 (*)    Albumin 3.2 (*)    All other components within normal limits  CBC - Abnormal; Notable for the following components:   Hemoglobin 10.2 (*)    HCT 33.1 (*)    MCV 73.6 (*)    MCH 22.7 (*)    RDW 16.0 (*)    Platelets 488 (*)    All other components within normal limits  URINALYSIS, ROUTINE W REFLEX MICROSCOPIC - Abnormal; Notable for the following components:   APPearance HAZY (*)    Glucose, UA >=500 (*)    Bacteria, UA RARE (*)    All other components within normal limits  LIPASE, BLOOD  I-STAT BETA HCG BLOOD, ED ([facility], [facility], [facility] ONLY)   Results for orders placed or performed during the hospital encounter of 02/24/19  Lipase, blood  Result Value Ref Range   Lipase 22 11 - 51 U/L  Comprehensive metabolic panel  Result Value Ref Range   Sodium 135 135 - 145 mmol/L   Potassium 3.9 3.5 - 5.1 mmol/L   Chloride 100 98 - 111 mmol/L   CO2 24 22 - 32 mmol/L   Glucose, Bld 283 (H) 70 - 99 mg/dL   BUN 7 6 - 20 mg/dL   Creatinine, Ser 0.74 0.44 - 1.00 mg/dL   Calcium 8.9 8.9 - 10.3 mg/dL   Total Protein 7.1 6.5 - 8.1 g/dL   Albumin 3.2 (L) 3.5 - 5.0 g/dL   AST 17 15 - 41 U/L   ALT 14 0 - 44 U/L   Alkaline Phosphatase 68 38 - 126 U/L   Total Bilirubin 0.4 0.3 - 1.2 mg/dL   GFR calc non Af Amer >60 >60 mL/min   GFR calc Af Amer >60 >60 mL/min   Anion gap 11 5 - 15  CBC  Result Value Ref Range   WBC 10.2 4.0 - 10.5 K/uL   RBC 4.50 3.87 - 5.11 MIL/uL   Hemoglobin 10.2 (L) 12.0 - 15.0 g/dL   HCT 33.1 (L) 36.0 - 46.0 %   MCV 73.6 (L) 80.0 - 100.0 fL   MCH 22.7 (L) 26.0 - 34.0 pg   MCHC 30.8 30.0 - 36.0 g/dL   RDW 16.0 (H) 11.5 - 15.5 %   Platelets 488 (H) 150 - 400 K/uL   nRBC 0.0 0.0 - 0.2 %  Urinalysis, Routine w reflex microscopic  Result Value Ref Range   Color, Urine YELLOW YELLOW   APPearance HAZY (A) CLEAR    Specific Gravity, Urine 1.025 1.005 - 1.030   pH 5.0  5.0 - 8.0   Glucose, UA >=500 (A) NEGATIVE mg/dL   Hgb urine dipstick NEGATIVE NEGATIVE   Bilirubin Urine NEGATIVE NEGATIVE   Ketones, ur NEGATIVE NEGATIVE mg/dL   Protein, ur NEGATIVE NEGATIVE mg/dL   Nitrite NEGATIVE NEGATIVE   Leukocytes,Ua NEGATIVE NEGATIVE   RBC / HPF 0-5 0 - 5 RBC/hpf   WBC, UA 0-5 0 - 5 WBC/hpf   Bacteria, UA RARE (A) NONE SEEN   Squamous Epithelial / LPF 0-5 0 - 5   Mucus PRESENT     EKG None  Radiology No results found.  Procedures Procedures (including critical care time)  Medications Ordered in ED Medications  sodium chloride flush (NS) 0.9 % injection 3 mL (3 mLs Intravenous Not Given 02/25/19 0020)  ondansetron (ZOFRAN-ODT) disintegrating tablet 4 mg (has no administration in time range)     Initial Impression / Assessment and Plan / ED Course  I have reviewed the triage vital signs and the nursing notes.  Pertinent labs & imaging results that were available during my care of the patient were reviewed by me and considered in my medical decision making (see chart for details).        Patient with history of DM, HTN presents with 3 days of nausea and 2-3 per day episodes nonbloody diarrhea. She report sinus headache (frontal pressure, bilateral) and today having lightheadedness when standing. No fall or syncope.   VSS stable, no fever, tachycardia or hypotension. CBG mildly hyperglycemic at 283 without DKA. Lipase normal. She is absent her gall bladder. She is slightly anemic, consistent with previous comparisons.   Will given zofran and try PO challenge.   The patient is drinking ginger ale and eating crackers. Nausea improved.   She reports a known COVID positive exposure at work. Though she does not have fever, cough SOB, will send a COVID for testing at her request. This is considered reasonable given her exposure.  Discussed return precautions with the patient and importance of PCP  follow up. She is felt appropriate for discharge with Rx Zofran.  Final Clinical Impressions(s) / ED Diagnoses   Final diagnoses:  None   1. Nausea 2. Diarrhea 3. Headache  ED Discharge Orders    None       Elpidio AnisUpstill, , PA-C 02/25/19 0152    Nira Connardama, Pedro Eduardo, MD 02/25/19 [phone]

## 2019-02-25 NOTE — Discharge Instructions (Addendum)
Take Zofran every 8 hours as prescribed for nausea. Push fluids to remain hydrated.   Return to the emergency department with any high fever, uncontrolled vomiting, bloody stools, severe pain or new concern.  Your COVID test will result in 6-24 hours and results can be found on MyChart.

## 2019-02-25 NOTE — ED Notes (Signed)
Pt given diet ginger ale.

## 2019-02-26 ENCOUNTER — Ambulatory Visit (INDEPENDENT_AMBULATORY_CARE_PROVIDER_SITE_OTHER): Payer: Self-pay | Admitting: Primary Care

## 2019-03-02 ENCOUNTER — Other Ambulatory Visit: Payer: Self-pay | Admitting: Primary Care

## 2019-03-02 DIAGNOSIS — R928 Other abnormal and inconclusive findings on diagnostic imaging of breast: Secondary | ICD-10-CM

## 2019-03-03 ENCOUNTER — Ambulatory Visit
Admission: RE | Admit: 2019-03-03 | Discharge: 2019-03-03 | Disposition: A | Discharge status: Home / Self Care | Payer: BC Managed Care – PPO | Source: Ambulatory Visit | Attending: Primary Care | Admitting: Primary Care

## 2019-03-03 ENCOUNTER — Other Ambulatory Visit: Payer: Self-pay

## 2019-03-03 DIAGNOSIS — R928 Other abnormal and inconclusive findings on diagnostic imaging of breast: Secondary | ICD-10-CM | POA: Diagnosis not present

## 2019-03-03 DIAGNOSIS — N6312 Unspecified lump in the right breast, upper inner quadrant: Secondary | ICD-10-CM | POA: Diagnosis not present

## 2019-04-28 ENCOUNTER — Ambulatory Visit: Payer: BC Managed Care – PPO | Attending: Internal Medicine

## 2019-04-28 DIAGNOSIS — Z20822 Contact with and (suspected) exposure to covid-19: Secondary | ICD-10-CM

## 2019-04-30 LAB — NOVEL CORONAVIRUS, NAA

## 2019-05-02 NOTE — Progress Notes (Signed)
Test result was 'comment,' meaning there was an issue with the test sample and no result can be determined.  A repeat test (new swab) will be needed.  Please call [facility] at [phone] if you have any questions or concerns.  

## 2019-05-15 ENCOUNTER — Ambulatory Visit (INDEPENDENT_AMBULATORY_CARE_PROVIDER_SITE_OTHER): Payer: BC Managed Care – PPO | Admitting: Primary Care

## 2019-06-19 ENCOUNTER — Other Ambulatory Visit (INDEPENDENT_AMBULATORY_CARE_PROVIDER_SITE_OTHER): Payer: Self-pay | Admitting: Primary Care

## 2019-06-19 DIAGNOSIS — E119 Type 2 diabetes mellitus without complications: Secondary | ICD-10-CM

## 2019-06-22 NOTE — Telephone Encounter (Signed)
Sent to PCP ?

## 2019-06-23 NOTE — Telephone Encounter (Signed)
Needs appointment for refills.

## 2019-07-05 ENCOUNTER — Other Ambulatory Visit (INDEPENDENT_AMBULATORY_CARE_PROVIDER_SITE_OTHER): Payer: Self-pay | Admitting: Primary Care

## 2019-07-05 DIAGNOSIS — E119 Type 2 diabetes mellitus without complications: Secondary | ICD-10-CM

## 2019-07-06 NOTE — Telephone Encounter (Signed)
Sent to PCP ?

## 2020-06-20 ENCOUNTER — Other Ambulatory Visit: Payer: Self-pay

## 2020-06-20 ENCOUNTER — Encounter (HOSPITAL_COMMUNITY): Payer: Self-pay

## 2020-06-20 ENCOUNTER — Emergency Department (HOSPITAL_COMMUNITY)
Admission: EM | Admit: 2020-06-20 | Discharge: 2020-06-20 | Disposition: A | Discharge status: Home / Self Care | Payer: BLUE CROSS/BLUE SHIELD | Attending: Emergency Medicine | Admitting: Emergency Medicine

## 2020-06-20 DIAGNOSIS — E119 Type 2 diabetes mellitus without complications: Secondary | ICD-10-CM | POA: Diagnosis not present

## 2020-06-20 DIAGNOSIS — Z79899 Other long term (current) drug therapy: Secondary | ICD-10-CM | POA: Insufficient documentation

## 2020-06-20 DIAGNOSIS — H5712 Ocular pain, left eye: Secondary | ICD-10-CM | POA: Insufficient documentation

## 2020-06-20 DIAGNOSIS — I10 Essential (primary) hypertension: Secondary | ICD-10-CM | POA: Insufficient documentation

## 2020-06-20 DIAGNOSIS — H11432 Conjunctival hyperemia, left eye: Secondary | ICD-10-CM | POA: Insufficient documentation

## 2020-06-20 DIAGNOSIS — Z7984 Long term (current) use of oral hypoglycemic drugs: Secondary | ICD-10-CM | POA: Diagnosis not present

## 2020-06-20 HISTORY — DX: Type 2 diabetes mellitus without complications: E11.9

## 2020-06-20 MED ORDER — OFLOXACIN 0.3 % OP SOLN
1.0000 [drp] | Freq: Four times a day (QID) | OPHTHALMIC | Status: DC
  Administered 2020-06-20: 1 [drp] via OPHTHALMIC
  Filled 2020-06-20: qty 5

## 2020-06-20 MED ORDER — FLUORESCEIN SODIUM 1 MG OP STRP
1.0000 | ORAL_STRIP | Freq: Once | OPHTHALMIC | Status: AC
  Administered 2020-06-20: 1 via OPHTHALMIC
  Filled 2020-06-20: qty 1

## 2020-06-20 MED ORDER — TETRACAINE HCL 0.5 % OP SOLN
2.0000 [drp] | Freq: Once | OPHTHALMIC | Status: AC
  Administered 2020-06-20: 2 [drp] via OPHTHALMIC
  Filled 2020-06-20: qty 4

## 2020-06-20 NOTE — Discharge Instructions (Signed)
Use eyedrops as directed.  As we discussed, please follow-up with referred ophthalmology office.  Call their office tomorrow to arrange for an appointment.  Return to the emergency department for any worsening pain, fever, vision changes or any other worsening concerning symptoms.

## 2020-06-20 NOTE — ED Notes (Addendum)
Visual Acuity: rt. eye:20/20, lt. Eye 20/20, both eyes:20/20. Pt. Visual acuity without corrective lenses. Pt. Does not wear corrective lenses.

## 2020-06-20 NOTE — ED Triage Notes (Signed)
Pt to ED by POV from UC with c/o left eye discomfort, redness, irritation which began this morning. Pt was seen and evaluated at the [facility] and was told to come here if it didn't start to feel better. Arrives A+O, VSS, NADN.

## 2020-06-20 NOTE — ED Provider Notes (Signed)
[facility]-EMERGENCY DEPT Provider Note   CSN: [id number] Arrival date & time: 06/20/20  2017     History Chief Complaint  Patient presents with  . Eye Problem    Ronnette Rump is a 55 y.o. female with PMH/o DM who presents for evaluation of left eye pain, redness, irritation.  She states she woke up this morning and felt like something was in her eye.  States that her eye has been irritated and red since then.  She states that she has some "cloudy vision" but denies any double vision, blurry vision.  She does not wear contact lens but she does wear reading glasses.  She does not follow with an eye doctor.  She states it has been watering.  She does not endorse any discharge.  She denies any trauma to the eye.  She denies any fevers.  She would urgent care and they sent her for further evaluation here.  The history is provided by the patient.       Past Medical History:  Diagnosis Date  . Diabetes mellitus without complication [facility])     Patient Active Problem List   Diagnosis Date Noted  . Essential hypertension 06/04/2018    History reviewed. No pertinent surgical history.   OB History   No obstetric history on file.     No family history on file.  Social History   Tobacco Use  . Smoking status: Never Smoker  . Smokeless tobacco: Never Used  Substance Use Topics  . Alcohol use: Not Currently  . Drug use: Never    Home Medications Prior to Admission medications   Medication Sig Start Date End Date Taking? Authorizing Provider  glipiZIDE (GLUCOTROL XL) 10 MG 24 hr tablet Take 1 tablet (10 mg total) by mouth 2 (two) times daily. 02/12/19 03/14/19  Grayce Sessions, NP  Iron, Ferrous Sulfate, 325 (65 Fe) MG TABS Take 325 mg by mouth daily. 02/13/19   Grayce Sessions, NP  losartan (COZAAR) 25 MG tablet Take 1 tablet (25 mg total) by mouth daily. 02/12/19 03/14/19  Grayce Sessions, NP  metFORMIN (GLUCOPHAGE-XR) 500 MG 24 hr tablet Take 1  tablet by mouth twice daily for 30 days 07/06/19   Grayce Sessions, NP  ondansetron (ZOFRAN ODT) 4 MG disintegrating tablet Take 1 tablet (4 mg total) by mouth every 8 (eight) hours as needed for nausea or vomiting. 02/25/19   Elpidio Anis, PA-C  senna (SENOKOT) 8.6 MG TABS tablet Take 1 tablet (8.6 mg total) by mouth daily. Patient not taking: Reported on 02/25/2019 02/13/19   Grayce Sessions, NP  simvastatin (ZOCOR) 20 MG tablet Take 1 tablet (20 mg total) by mouth at bedtime for 30 days. 07/28/18 02/25/28  Grayce Sessions, NP    Allergies    Patient has no known allergies.  Review of Systems   Review of Systems  Constitutional: Negative for fever.  Eyes: Positive for pain, redness and visual disturbance.  All other systems reviewed and are negative.   Physical Exam Updated Vital Signs BP (!) 156/90 (BP Location: Left Arm)   Pulse 94   Temp 98.3 F (36.8 C) (Oral)   Resp 16   Ht 5\' 5"  (1.651 m)   Wt 134.7 kg   LMP 06/20/2020 (Exact Date)   SpO2 97%   BMI 49.42 kg/m   Physical Exam Vitals and nursing note reviewed.  Constitutional:      Appearance: She is well-developed.  HENT:  Head: Normocephalic and atraumatic.  Eyes:     General: No scleral icterus.       Right eye: No discharge.        Left eye: No discharge.     Conjunctiva/sclera:     Left eye: Left conjunctiva is injected.     Comments: Left conjunctival injection.  Pupils equal and reactive bilaterally.  EOMs intact any difficulty.  No overlying warmth, erythema noted periorbital region bilaterally.  No hyphema, hypopyon.  No cloudy cornea.  Pulmonary:     Effort: Pulmonary effort is normal.  Skin:    General: Skin is warm and dry.  Neurological:     Mental Status: She is alert.  Psychiatric:        Speech: Speech normal.        Behavior: Behavior normal.     ED Results / Procedures / Treatments   Labs (all labs ordered are listed, but only abnormal results are displayed) Labs  Reviewed - No data to display  EKG None  Radiology No results found.  Procedures Procedures   Medications Ordered in ED Medications  ofloxacin (OCUFLOX) 0.3 % ophthalmic solution 1 drop (1 drop Left Eye Given 06/20/20 2342)  tetracaine (PONTOCAINE) 0.5 % ophthalmic solution 2 drop (2 drops Both Eyes Given 06/20/20 2338)  fluorescein ophthalmic strip 1 strip (1 strip Both Eyes Given 06/20/20 2338)    ED Course  I have reviewed the triage vital signs and the nursing notes.  Pertinent labs & imaging results that were available during my care of the patient were reviewed by me and considered in my medical decision making (see chart for details).    MDM Rules/Calculators/A&P                          55 y.o. F with PMH/o DM who presents for evaluation of left eye pain, redness, irritation.  She reports he woke up this morning felt like something was in her eye.  No trauma.  She does not wear contacts.  She denies any blurry vision, double vision but does state that it feels like it is cloudy.  Initial arrival, she is afebrile, nontoxic-appearing.  Vital signs are stable.  On exam, there is some slight conjunctival injection.  Pupils reactive.  EOMs intact light difficulty.  Lids inverted with sterile Q-tip which showed no evidence of foreign body.  Consider conjunctivitis versus corneal abrasion/foreign body.  History/physical exam is not concerning for periorbital, preseptal cellulitis.  Do not suspect glaucoma but is a consideration.    Visual Acuity  Right Eye Distance:  20/20 Left Eye Distance:  20/20 Bilateral Distance:  20/20    Woods lamp evaluation shows no evidence of fluorescein uptake, dendritic lesions, Seidel sign.  Intraocular pressure as documented below:  Left IOP: 14, 14, 13 Right IOP: 15, 14, 12  At this time, do not see any evidence of foreign body.  Unfortunately at this time, we do not have a slit-lamp so cannot evaluate further.  I everted the lids and did not  see any evidence of foreign body.  Additionally, she has no forcing uptake on Woods lamp evaluation.  At this time, intraocular pressures are reassuring. Visual acuity is within normal limits. We will plan to put her on antibiotic eyedrops and have her follow-up with ophthalmology. At this time, patient exhibits no emergent life-threatening condition that require further evaluation in ED. Discussed patient with Dr. Stevie Kern who is agreeable to plan. Patient  had ample opportunity for questions and discussion. All patient's questions were answered with full understanding. Strict return precautions discussed. Patient expresses understanding and agreement to plan.    Portions of this note were generated with Scientist, clinical (histocompatibility and immunogenetics). Dictation errors may occur despite best attempts at proofreading.    Final Clinical Impression(s) / ED Diagnoses Final diagnoses:  Left eye pain    Rx / DC Orders ED Discharge Orders    None       Rosana Hoes 06/20/20 2350    Milagros Loll, MD 06/24/20 [phone]

## 2021-01-19 ENCOUNTER — Other Ambulatory Visit: Payer: Self-pay | Admitting: Nurse Practitioner

## 2021-01-19 DIAGNOSIS — Z1231 Encounter for screening mammogram for malignant neoplasm of breast: Secondary | ICD-10-CM

## 2021-02-15 ENCOUNTER — Other Ambulatory Visit: Payer: Self-pay

## 2021-02-15 ENCOUNTER — Ambulatory Visit
Admission: RE | Admit: 2021-02-15 | Discharge: 2021-02-15 | Disposition: A | Discharge status: Home / Self Care | Payer: BLUE CROSS/BLUE SHIELD | Source: Ambulatory Visit | Attending: Nurse Practitioner | Admitting: Nurse Practitioner

## 2021-02-15 DIAGNOSIS — Z1231 Encounter for screening mammogram for malignant neoplasm of breast: Secondary | ICD-10-CM

## 2021-05-29 ENCOUNTER — Other Ambulatory Visit (HOSPITAL_BASED_OUTPATIENT_CLINIC_OR_DEPARTMENT_OTHER): Payer: Self-pay

## 2021-05-29 ENCOUNTER — Ambulatory Visit (INDEPENDENT_AMBULATORY_CARE_PROVIDER_SITE_OTHER): Payer: 59 | Admitting: Nurse Practitioner

## 2021-05-29 ENCOUNTER — Other Ambulatory Visit: Payer: Self-pay

## 2021-05-29 ENCOUNTER — Encounter (HOSPITAL_BASED_OUTPATIENT_CLINIC_OR_DEPARTMENT_OTHER): Payer: Self-pay | Admitting: Nurse Practitioner

## 2021-05-29 VITALS — BP 117/78 | HR 81 | Ht 65.0 in | Wt 298.2 lb

## 2021-05-29 DIAGNOSIS — Z13228 Encounter for screening for other metabolic disorders: Secondary | ICD-10-CM

## 2021-05-29 DIAGNOSIS — Z130 Encounter for screening for diseases of the blood and blood-forming organs and certain disorders involving the immune mechanism: Secondary | ICD-10-CM

## 2021-05-29 DIAGNOSIS — E785 Hyperlipidemia, unspecified: Secondary | ICD-10-CM | POA: Insufficient documentation

## 2021-05-29 DIAGNOSIS — H1013 Acute atopic conjunctivitis, bilateral: Secondary | ICD-10-CM | POA: Insufficient documentation

## 2021-05-29 DIAGNOSIS — Z1321 Encounter for screening for nutritional disorder: Secondary | ICD-10-CM

## 2021-05-29 DIAGNOSIS — Z1211 Encounter for screening for malignant neoplasm of colon: Secondary | ICD-10-CM

## 2021-05-29 DIAGNOSIS — Z0000 Encounter for general adult medical examination without abnormal findings: Secondary | ICD-10-CM | POA: Insufficient documentation

## 2021-05-29 DIAGNOSIS — Z1329 Encounter for screening for other suspected endocrine disorder: Secondary | ICD-10-CM

## 2021-05-29 DIAGNOSIS — E11628 Type 2 diabetes mellitus with other skin complications: Secondary | ICD-10-CM | POA: Diagnosis not present

## 2021-05-29 DIAGNOSIS — B379 Candidiasis, unspecified: Secondary | ICD-10-CM | POA: Insufficient documentation

## 2021-05-29 DIAGNOSIS — I10 Essential (primary) hypertension: Secondary | ICD-10-CM

## 2021-05-29 DIAGNOSIS — E119 Type 2 diabetes mellitus without complications: Secondary | ICD-10-CM | POA: Insufficient documentation

## 2021-05-29 DIAGNOSIS — Z6841 Body Mass Index (BMI) 40.0 and over, adult: Secondary | ICD-10-CM | POA: Insufficient documentation

## 2021-05-29 MED ORDER — ONDANSETRON HCL 8 MG PO TABS
8.0000 mg | ORAL_TABLET | Freq: Three times a day (TID) | ORAL | 3 refills | Status: DC | PRN
  Filled 2021-05-29: qty 30, 10d supply, fill #0

## 2021-05-29 MED ORDER — AZELASTINE HCL 0.05 % OP SOLN
1.0000 [drp] | Freq: Two times a day (BID) | OPHTHALMIC | 12 refills | Status: DC
  Filled 2021-05-29: qty 6, 30d supply, fill #0
  Filled 2021-12-08: qty 6, 30d supply, fill #1

## 2021-05-29 MED ORDER — OZEMPIC (0.25 OR 0.5 MG/DOSE) 2 MG/1.5ML ~~LOC~~ SOPN
PEN_INJECTOR | SUBCUTANEOUS | 0 refills | Status: DC
  Filled 2021-05-29: qty 1.5, 28d supply, fill #0

## 2021-05-29 MED ORDER — SEMAGLUTIDE (1 MG/DOSE) 4 MG/3ML ~~LOC~~ SOPN
1.0000 mg | PEN_INJECTOR | SUBCUTANEOUS | 3 refills | Status: DC
  Filled 2021-05-29 – 2021-12-08 (×4): qty 3, 28d supply, fill #0

## 2021-05-29 MED ORDER — SIMVASTATIN 20 MG PO TABS
20.0000 mg | ORAL_TABLET | Freq: Every day | ORAL | 3 refills | Status: DC
  Filled 2021-05-29: qty 90, 90d supply, fill #0
  Filled 2021-12-08: qty 90, 90d supply, fill #1

## 2021-05-29 MED ORDER — FREESTYLE LIBRE 3 SENSOR MISC
1.0000 [IU] | 11 refills | Status: DC
  Filled 2021-05-29 – 2021-12-08 (×3): qty 2, 28d supply, fill #0

## 2021-05-29 MED ORDER — SEMAGLUTIDE(0.25 OR 0.5MG/DOS) 2 MG/3ML ~~LOC~~ SOPN
0.5000 mg | PEN_INJECTOR | SUBCUTANEOUS | 0 refills | Status: DC
  Filled 2021-05-29 – 2021-07-02 (×3): qty 1.5, 28d supply, fill #0
  Filled 2021-11-06 – 2021-11-07 (×2): qty 3, 28d supply, fill #0

## 2021-05-29 MED ORDER — NYSTATIN 100000 UNIT/GM EX POWD
1.0000 "application " | Freq: Three times a day (TID) | CUTANEOUS | 2 refills | Status: DC | PRN
  Filled 2021-05-29: qty 60, 20d supply, fill #0
  Filled 2021-07-20: qty 60, 20d supply, fill #1

## 2021-05-29 NOTE — Assessment & Plan Note (Signed)
Candidal skin infection in setting of diabetes of unknown control. Suspect BG levels are elevated as patient has been off of medication for a while now.  Recommend continued treatment with nystatin cream and will add nystatin powder for daytime use.  Also recommend use of moisture wicking fabric barrier between skin folds at night to help reduce moisture presence. F/U if sx worsen or fail to improve.

## 2021-05-29 NOTE — Assessment & Plan Note (Signed)
Chronic. On statin therapy.  Recommend continued statin. Will obtain labs today for further evaluation.  Information on diet provided to patient with hand outs and AVS.  F/U in 3 months or sooner if needed.

## 2021-05-29 NOTE — Progress Notes (Signed)
Tollie Eth, DNP, AGNP-c [facility] [address]   [address] [place], [place] [address] [phone]  New patient visit   Patient: Tammy Marks   DOB: 09-08-1965   56 y.o. Female  MRN: [id number] Visit Date: 05/29/2021  Patient Care Team: , Sung Amabile, NP as PCP - General (Nurse Practitioner)  Today's healthcare provider: Tollie Eth, NP   Chief Complaint  Patient presents with   New Patient (Initial Visit)    Patient presents today to establish care. She is diabetic. She would like a lab panel drawn today. She would like to discuss medication options for diabetes with you. Patient stated she is needs a referral for colonoscopy.    Subjective    Tammy Marks is a 56 y.o. female who presents today as a new patient to establish care.    Patient endorses the following concerns presently: Diabetes  Tammy Marks tells me that she was previously on metformin and glipizide for management of her diabetes. She has been out of the medication for about 2+ months at this time. She reports that while on the metformin she experienced significant diarrhea and GI pain. She tells me she has not been checking her BG at home so she isn't quite sure what her BG is looking like. She does endorse a yeast infection in the folds of her abdomen that her OBGYN put her on diflucan for. She tells me it is still present, but much improved. She does still have topical cream to use. She reports she is always drinking water and her urine is clear. She denies increased thirst, hunger, or urination. She has been monitoring her diet and trying to eat healthier options.  She has been on Ozempic in the past and had good success with it, but was unable to get her prescription refilled beyond the starter dosing. She would like to try this again, if possible.   Allergic Conjunctivitis Tammy Marks reports increased allergies since moving to [place]. She tells me that she is able to take OTC  oral medication for this which does help some, but her eyes remain problematic with increased mucous production and film on the eyes. She also endorses redness, itching, and burning of the eyes most day.   History reviewed and reveals the following: Past Medical History:  Diagnosis Date   Diabetes mellitus without complication (HCC)    History reviewed. No pertinent surgical history. No family status information on file.   History reviewed. No pertinent family history. Social History   Socioeconomic History   Marital status: Single    Spouse name: Not on file   Number of children: Not on file   Years of education: Not on file   Highest education level: Not on file  Occupational History   Not on file  Tobacco Use   Smoking status: Never   Smokeless tobacco: Never  Substance and Sexual Activity   Alcohol use: Not Currently   Drug use: Never   Sexual activity: Not Currently  Other Topics Concern   Not on file  Social History Narrative   Not on file   Social Determinants of Health   Financial Resource Strain: Not on file  Food Insecurity: Not on file  Transportation Needs: Not on file  Physical Activity: Not on file  Stress: Not on file  Social Connections: Not on file   Outpatient Medications Prior to Visit  Medication Sig   Iron, Ferrous Sulfate, 325 (65 Fe) MG TABS Take  325 mg by mouth daily.   [DISCONTINUED] glipiZIDE (GLUCOTROL XL) 10 MG 24 hr tablet Take 1 tablet (10 mg total) by mouth 2 (two) times daily.   [DISCONTINUED] losartan (COZAAR) 25 MG tablet Take 1 tablet (25 mg total) by mouth daily.   [DISCONTINUED] metFORMIN (GLUCOPHAGE-XR) 500 MG 24 hr tablet Take 1 tablet by mouth twice daily for 30 days   [DISCONTINUED] ondansetron (ZOFRAN ODT) 4 MG disintegrating tablet Take 1 tablet (4 mg total) by mouth every 8 (eight) hours as needed for nausea or vomiting.   [DISCONTINUED] senna (SENOKOT) 8.6 MG TABS tablet Take 1 tablet (8.6 mg total) by mouth daily. (Patient  not taking: Reported on 02/25/2019)   [DISCONTINUED] simvastatin (ZOCOR) 20 MG tablet Take 1 tablet (20 mg total) by mouth at bedtime for 30 days.   No facility-administered medications prior to visit.   No Known Allergies Immunization History  Administered Date(s) Administered   Influenza,inj,Quad PF,6+ Mos 02/12/2019   Influenza-Unspecified 01/07/2021   Tdap 02/12/2019    Health Maintenance Due: Health Maintenance  Topic Date Due   COVID-19 Vaccine (1) Never done   FOOT EXAM  Never done   OPHTHALMOLOGY EXAM  Never done   Hepatitis C Screening  Never done   PAP SMEAR-Modifier  Never done   COLONOSCOPY (Pts 45-58yrs Insurance coverage will need to be confirmed)  Never done   Zoster Vaccines- Shingrix (1 of 2) Never done   HEMOGLOBIN A1C  08/12/2019   URINE MICROALBUMIN  02/12/2020   MAMMOGRAM  02/16/2023   TETANUS/TDAP  02/11/2029   INFLUENZA VACCINE  Completed   HIV Screening  Completed   HPV VACCINES  Aged Out    Review of Systems All review of systems negative except what is listed in the HPI   Objective    BP 117/78    Pulse 81    Ht 5\' 5"  (1.651 m)    Wt 298 lb 3.2 oz (135.3 kg)    SpO2 95%    BMI 49.62 kg/m  Physical Exam  No results found for any visits on 05/29/21.  Assessment & Plan      Problem List Items Addressed This Visit     Essential hypertension    Chronic. Previously on losartan- not taking anything at this time.  BP well controlled today.  Recommend at home monitoring and notify if BP show >135/80 for restart of medication. At this time, do not feel lowering BP would be appropriate option given the current numbers.  Will monitor.       Relevant Medications   simvastatin (ZOCOR) 20 MG tablet   Other Relevant Orders   CBC with Differential/Platelet   Comprehensive metabolic panel   Encounter for medical examination to establish care - Primary    Review of current and past medical history, social history, medication, and family history.   Review of care gaps and health maintenance recommendations.  Records from recent providers to be requested if not available in Chart Review or Care Everywhere.  Recommendations for health maintenance, diet, and exercise provided.  Labs today.         Candida infection    Candidal skin infection in setting of diabetes of unknown control. Suspect BG levels are elevated as patient has been off of medication for a while now.  Recommend continued treatment with nystatin cream and will add nystatin powder for daytime use.  Also recommend use of moisture wicking fabric barrier between skin folds at night to help reduce moisture presence.  F/U if sx worsen or fail to improve.       Relevant Medications   nystatin (MYCOSTATIN/NYSTOP) powder   Diabetes mellitus (HCC)    Chronic. Previous tx with metformin and glipizide resulted in severe GI distress.  Recommend Ozempic for improved management given patients BMI and CV health risks.  She is agreeable to this and has tried in the past with success.  Recommend CGM for BG monitoring to help with blood sugar understanding with foods and for close monitoring to help achieve goals of blood sugar and weight loss.  Freestyle Libre3 placed in office today.  Will monitor with Libreview and f/u in 3 months or sooner if needed.  Labs today.  Will need to review record for ophthalmic exam and microalbumin screening. Based on labs, consider addition of Farxiga for kidney and CV protection.        Relevant Medications   Semaglutide,0.25 or 0.5MG /DOS, (OZEMPIC, 0.25 OR 0.5 MG/DOSE,) 2 MG/1.5ML SOPN   Semaglutide,0.25 or 0.5MG /DOS, 2 MG/1.5ML SOPN   Semaglutide, 1 MG/DOSE, 4 MG/3ML SOPN   Continuous Blood Gluc Sensor (FREESTYLE LIBRE 3 SENSOR) MISC   simvastatin (ZOCOR) 20 MG tablet   ondansetron (ZOFRAN) 8 MG tablet   Other Relevant Orders   Hemoglobin A1c   Hyperlipidemia    Chronic. On statin therapy.  Recommend continued statin. Will obtain labs  today for further evaluation.  Information on diet provided to patient with hand outs and AVS.  F/U in 3 months or sooner if needed.       Relevant Medications   simvastatin (ZOCOR) 20 MG tablet   Allergic conjunctivitis of both eyes    Symptoms and presentation consistent with allergic conjunctivitis.  Recommend continued oral antihistamine use and will add azelastine ophth for additional treatment modality.  F/U if sx worsen or fail to improve.       Relevant Medications   azelastine (OPTIVAR) 0.05 % ophthalmic solution   BMI 45.0-49.9, adult (HCC)    BMI 49.62 in office today.  Increased risks associated with co-morbid conditions.  Recommendations for GLP-1 therapy for diabetes, CV health, and weight management.  Will obtain labs today for evaluation.  Recommendations for diet and activity provided.  Plan to f/u in 3 months.       Relevant Medications   Semaglutide,0.25 or 0.5MG /DOS, (OZEMPIC, 0.25 OR 0.5 MG/DOSE,) 2 MG/1.5ML SOPN   Semaglutide,0.25 or 0.5MG /DOS, 2 MG/1.5ML SOPN   Semaglutide, 1 MG/DOSE, 4 MG/3ML SOPN   Other Visit Diagnoses     Screening for colon cancer       Relevant Orders   Cologuard   Screening for endocrine, nutritional, metabolic and immunity disorder       Relevant Orders   CBC with Differential/Platelet   Comprehensive metabolic panel   Lipid panel   Hemoglobin A1c   VITAMIN D 25 Hydroxy (Vit-D Deficiency, Fractures)   TSH        Return in about 3 months (around 08/26/2021) for VV DM.     , Sung Amabile, NP, DNP, AGNP-C [facility] at [facility]

## 2021-05-29 NOTE — Assessment & Plan Note (Signed)
BMI 49.62 in office today.  Increased risks associated with co-morbid conditions.  Recommendations for GLP-1 therapy for diabetes, CV health, and weight management.  Will obtain labs today for evaluation.  Recommendations for diet and activity provided.  Plan to f/u in 3 months.

## 2021-05-29 NOTE — Assessment & Plan Note (Signed)
Symptoms and presentation consistent with allergic conjunctivitis.  Recommend continued oral antihistamine use and will add azelastine ophth for additional treatment modality.  F/U if sx worsen or fail to improve.

## 2021-05-29 NOTE — Assessment & Plan Note (Signed)
Review of current and past medical history, social history, medication, and family history.  Review of care gaps and health maintenance recommendations.  Records from recent providers to be requested if not available in Chart Review or Care Everywhere.  Recommendations for health maintenance, diet, and exercise provided.  Labs today 

## 2021-05-29 NOTE — Patient Instructions (Signed)
Thank you for choosing [facility] for your Primary Care needs. I am excited for the opportunity to partner with you to meet your health care goals. It was a pleasure meeting you today!  DIABETES High blood sugar can damage your organs, blood vessels, and nerves, slow wound healing, and increase your risk of infection, among many other things.  The risk of having a heart attack and/or stroke is [facility] higher if you have diabetes with uncontrolled blood sugars.  To help reduce your risks and keep you healthy, we must work together to get your blood sugar levels under control with diet, exercise, and medication.    The most important and effective way to control diabetes are through diet changes and regular exercise.   What is Happening With Diabetes? Foods high in carbohydrates (sugars, starches, bread, pasta, potatoes, soda, fruit juices, etc) break down into a sugar called glucose once in your body. Glucose is used by the cells in your body for fuel to have the energy they need to work properly.  As the glucose is released into your blood stream during digestion, your blood sugar goes up. This is called hyperglycemia.   Insulin is a hormone in the body that works as a key to unlock the cell and allow the glucose in.  Normally, insulin is released in response to rising blood glucose levels.   In people with diabetes, either the cells have changed the locks and don't open with the insulin key or there is not enough insulin made by the body to use all of the glucose in the blood.   This means that the glucose never makes it into the cells for fuel and stays in the blood. The high levels of glucose in the blood are like a poison to your organs and blood vessels and over time permanent damage starts to occur.  What Kind of Diet is Best for Diabetes? A person with diabetes must limit the amount of carbohydrates and sugar eaten to help prevent high blood glucose  levels.     You should aim for less than 1/2 of your total calorie intake per day to come from carbohydrates.  What this means is, if you eat a 1200-1500 calorie diet, you will want less than 600-750 of those calories to come from carbohydrates. That equals to about 150-200 grams of carbohydrates per day.   GOAL: Eat 1200-1500 calories a day with 150-200 grams of carbohydrates or less.   Reading labels is very important to help understand how many carbohydrates are in certain foods. There are also tables available online for restaurant foods that may help when you are eating out.   Important foods to INCREASE in your diet are lean meats, protein, and vegetables. It can be very helpful to measure the food you are eating by the recommended serving size on packages to make sure you are not overeating and monitor your calories and carbohydrates.   How Does Exercise Help with Diabetes? When you exercise, the cells in your body use up more energy. This means that they need more fuel to keep going. The cells that can still use the insulin key, take in more of the glucose from the blood for energy and the blood glucose levels go down.   Excess fat cells can be the cause of the insulin key no longer working. Exercise and weight loss can change the locks back to allow the insulin key to work again.   Enough weight  loss can sometimes get rid of diabetes!  What Kind of Exercise is Best for Diabetes? I recommend starting out with moderate exercise, like walking.  Walking every single day for at least 15-20 minutes can be enough to get you up and moving without wearing you out.  You want to walk at a pace that you can carry on a conversation without being too out of breath, but that you also get your heart rate up and break a sweat.  As you get used to daily walking, you should increase how far, how fast, and how long you walk.    Monitoring your blood sugars helps you have an understanding of how your  diet and activity levels are affecting your blood sugar. Certain foods that you think may not increase your blood sugar really make a difference. By monitoring your sugar when you eat, you can see how different foods affect your numbers. This is very important when you first start treatment to get a good understanding of what foods are good and what foods you should limit.   I would like you to monitor your blood sugar every morning before eating and write down the number to bring with you to your next visit. This will help [place] determine if we need to make changes to the medication and diet.   You may also want to check your blood sugar after meals to see how certain foods are affecting the numbers. Excellent blood sugar goals are between 90-120 when you have not eaten and less than 160 1-2 hours after a meal. If you are checking your blood sugars 1-2 hours after a meal and they are higher than this, this tells [place] that we need better control. If you are waking up with blood sugars above 120, we need to look at your diet the day before to see if this could be affecting the numbers.   Medication is the key to help control your blood sugars. With diabetes, your body is not using insulin like it should to help the glucose (sugar) get into the cells for fuel. Medications help your body produce more insulin and make your cells more receptive to insulin so that the glucose in your body can be used instead of remaining in the blood.  The first line medication is called Metformin. This is a pill that you take daily, usually twice a day, to help your body properly use glucose. If good control is not achieved with metformin, other medications can be added or changed to help with better control.   Management of cholesterol is vital to reduce your risks of heart attack and stroke. Medication to control your cholesterol is also very important and necessary for your overall health.  As a new diabetic, we will plan to  follow-up every 3 months to check your hemoglobin A1c, which gives me an average of your blood sugars over the past 3 months to help determine your control. Once your blood sugars are well controlled, we can go down to checking every 6 months.  We also need to closely monitor your feet, kidney function, and cholesterol as these are all affected from diabetes.    Recommendations from today's visit: We will get your labs today and see where you stand with your diabetes, cholesterol, and iron levels. I will contact you to let you know if we need to make any changes.  I have sent refills into the pharmacy for you. We will hold off on the blood pressure  medication right now because your BP looks great.  I have sent the Ozempic, Zofran, Simvastatin, and Powder to the pharmacy for you.  We will plan to touch base in 3 months or sooner if needed to see how you are doing.   Information on diet, exercise, and health maintenance recommendations are listed below. This is information to help you be sure you are on track for optimal health and monitoring.   Please look over this and let us know if you have any questions or if you have completed any of the health maintenance outside of [place] so that we can be sure your records are up to date.  ___________________________________________________________ About Me: I am an Adult-Geriatric Nurse Practitioner with a background in caring for patients for more than 20 years with a strong intensive care background. I provide primary care and sports medicine services to patients age 75 and older within this office. My education had a strong focus on caring for the older adult population, which I am passionate about. I am also the director of the APP Fellowship with [facility].   My desire is to provide you with the best service through preventive medicine and supportive care. I consider you a part of the medical team and value your input. I work diligently to  ensure that you are heard and your needs are met in a safe and effective manner. I want you to feel comfortable with me as your provider and want you to know that your health concerns are important to me.  For your information, our office hours are: Monday, Tuesday, and Thursday 8:00 AM - 5:00 PM Wednesday and Friday 8:00 AM - 12:00 PM.   In my time away from the office I am teaching new APP's within the system and am unavailable, but my partner, Dr. Burnard Bunting is in the office for emergent needs.   If you have questions or concerns, please call our office at [phone] or send [place] a MyChart message and we will respond as quickly as possible.  ____________________________________________________________ MyChart:  For all urgent or time sensitive needs we ask that you please call the office to avoid delays. Our number is [phone]. MyChart is not constantly monitored and due to the large volume of messages a day, replies may take up to 72 business hours.  MyChart Policy: MyChart allows for you to see your visit notes, after visit summary, provider recommendations, lab and tests results, make an appointment, request refills, and contact your provider or the office for non-urgent questions or concerns. Providers are seeing patients during normal business hours and do not have built in time to review MyChart messages.  We ask that you allow a minimum of 3 business days for responses to [employer]. For this reason, please do not send urgent requests through [place]. Please call the office at [phone]. New and ongoing conditions may require a visit. We have virtual and in person visit available for your convenience.  Complex MyChart concerns may require a visit. Your provider may request you schedule a virtual or in person visit to ensure we are providing the best care possible. MyChart messages sent after 11:00 AM on Friday will not be received by the provider until Monday morning.    Lab  and Test Results: You will receive your lab and test results on MyChart as soon as they are completed and results have been sent by the lab or testing facility. Due to this service, you will receive your  results BEFORE your provider.  I review lab and tests results each morning prior to seeing patients. Some results require collaboration with other providers to ensure you are receiving the most appropriate care. For this reason, we ask that you please allow a minimum of 3-5 business days from the time the ALL results have been received for your provider to receive and review lab and test results and contact you about these.  Most lab and test result comments from the provider will be sent through [place]. Your provider may recommend changes to the plan of care, follow-up visits, repeat testing, ask questions, or request an office visit to discuss these results. You may reply directly to this message or call the office at [phone] to provide information for the provider or set up an appointment. In some instances, you will be called with test results and recommendations. Please let us know if this is preferred and we will make note of this in your chart to provide this for you.    If you have not heard a response to your lab or test results in 5 business days from all results returning to [place], please call the office to let us know. We ask that you please avoid calling prior to this time unless there is an emergent concern. Due to high call volumes, this can delay the resulting process.  After Hours: For all non-emergency after hours needs, please call the office at [phone] and select the option to reach the on-call provider service. On-call services are shared between multiple [place] offices and therefore it will not be possible to speak directly with your provider. On-call providers may provide medical advice and recommendations, but are unable to provide refills for maintenance  medications.  For all emergency or urgent medical needs after normal business hours, we recommend that you seek care at the closest Urgent Care or Emergency Department to ensure appropriate treatment in a timely manner.  [facility] at [place] has a 24 hour emergency room located on the ground floor for your convenience.   Urgent Concerns During the Business Day Providers are seeing patients from 8AM to [place] with a busy schedule and are most often not able to respond to non-urgent calls until the end of the day or the next business day. If you should have URGENT concerns during the day, please call and speak to the nurse or schedule a same day appointment so that we can address your concern without delay.   Thank you, again, for choosing me as your health care partner. I appreciate your trust and look forward to learning more about you.   Worthy Keeler, DNP, AGNP-c ___________________________________________________________  Health Maintenance Recommendations Screening Testing Mammogram Every 1 -2 years based on history and risk factors Starting at age 38 Pap Smear Ages 21-39 every 3 years Ages 67-65 every 5 years with HPV testing More frequent testing may be required based on results and history Colon Cancer Screening Every 1-10 years based on test performed, risk factors, and history Starting at age 4 Bone Density Screening Every 2-10 years based on history Starting at age 56 for women Recommendations for men differ based on medication usage, history, and risk factors AAA Screening One time ultrasound Men 68-17 years old who have every smoked Lung Cancer Screening Low Dose Lung CT every 12 months Age 74-80 years with a 30 pack-year smoking history who still smoke or who have quit within the last 15 years  Screening Labs Routine  Labs: Complete  Blood Count (CBC), Complete Metabolic Panel (CMP), Cholesterol (Lipid Panel) Every 6-12 months based on history and  medications May be recommended more frequently based on current conditions or previous results Hemoglobin A1c Lab Every 3-12 months based on history and previous results Starting at age 67 or earlier with diagnosis of diabetes, high cholesterol, BMI >26, and/or risk factors Frequent monitoring for patients with diabetes to ensure blood sugar control Thyroid Panel (TSH w/ T3 & T4) Every 6 months based on history, symptoms, and risk factors May be repeated more often if on medication HIV One time testing for all patients 61 and older May be repeated more frequently for patients with increased risk factors or exposure Hepatitis C One time testing for all patients 6 and older May be repeated more frequently for patients with increased risk factors or exposure Gonorrhea, Chlamydia Every 12 months for all sexually active persons 13-24 years Additional monitoring may be recommended for those who are considered high risk or who have symptoms PSA Men 65-38 years old with risk factors Additional screening may be recommended from age 33-69 based on risk factors, symptoms, and history  Vaccine Recommendations Tetanus Booster All adults every 10 years Flu Vaccine All patients 6 months and older every year COVID Vaccine All patients 12 years and older Initial dosing with booster May recommend additional booster based on age and health history HPV Vaccine 2 doses all patients age 54-26 Dosing may be considered for patients over 26 Shingles Vaccine (Shingrix) 2 doses all adults 33 years and older Pneumonia (Pneumovax 23) All adults 44 years and older May recommend earlier dosing based on health history Pneumonia (Prevnar 41) All adults 66 years and older Dosed 1 year after Pneumovax 23  Additional Screening, Testing, and Vaccinations may be recommended on an individualized basis based on family history, health history, risk factors, and/or exposure.   __________________________________________________________  Diet Recommendations for All Patients  I recommend that all patients maintain a diet low in saturated fats, carbohydrates, and cholesterol. While this can be challenging at first, it is not impossible and small changes can make big differences.  Things to try: Decreasing the amount of soda, sweet tea, and/or juice to one or less per day and replace with water While water is always the first choice, if you do not like water you may consider adding a water additive without sugar to improve the taste other sugar free drinks Replace potatoes with a brightly colored vegetable at dinner Use healthy oils, such as canola oil or olive oil, instead of butter or hard margarine Limit your bread intake to two pieces or less a day Replace regular pasta with low carb pasta options Bake, broil, or grill foods instead of frying Monitor portion sizes  Eat smaller, more frequent meals throughout the day instead of large meals  An important thing to remember is, if you love foods that are not great for your health, you don't have to give them up completely. Instead, allow these foods to be a reward when you have done well. Allowing yourself to still have special treats every once in a while is a nice way to tell yourself thank you for working hard to keep yourself healthy.   Also remember that every day is a new day. If you have a bad day and "fall off the wagon", you can still climb right back up and keep moving along on your journey!  We have resources available to help you!  Some websites that may be helpful  include: www.http://carter.biz/  Www.VeryWellFit.com _____________________________________________________________  Activity Recommendations for All Patients  I recommend that all adults get at least 20 minutes of moderate physical activity that elevates your heart rate at least 5 days out of the week.  Some examples include: Walking or  jogging at a pace that allows you to carry on a conversation Cycling (stationary bike or outdoors) Water aerobics Yoga Weight lifting Dancing If physical limitations prevent you from putting stress on your joints, exercise in a pool or seated in a chair are excellent options.  Do determine your MAXIMUM heart rate for activity: YOUR AGE - 220 = MAX HeartRate   Remember! Do not push yourself too hard.  Start slowly and build up your pace, speed, weight, time in exercise, etc.  Allow your body to rest between exercise and get good sleep. You will need more water than normal when you are exerting yourself. Do not wait until you are thirsty to drink. Drink with a purpose of getting in at least 8, 8 ounce glasses of water a day plus more depending on how much you exercise and sweat.    If you begin to develop dizziness, chest pain, abdominal pain, jaw pain, shortness of breath, headache, vision changes, lightheadedness, or other concerning symptoms, stop the activity and allow your body to rest. If your symptoms are severe, seek emergency evaluation immediately. If your symptoms are concerning, but not severe, please let us know so that we can recommend further evaluation.

## 2021-05-29 NOTE — Assessment & Plan Note (Signed)
Chronic. Previously on losartan- not taking anything at this time.  BP well controlled today.  Recommend at home monitoring and notify if BP show >135/80 for restart of medication. At this time, do not feel lowering BP would be appropriate option given the current numbers.  Will monitor.

## 2021-05-29 NOTE — Assessment & Plan Note (Signed)
Chronic. Previous tx with metformin and glipizide resulted in severe GI distress.  Recommend Ozempic for improved management given patients BMI and CV health risks.  She is agreeable to this and has tried in the past with success.  Recommend CGM for BG monitoring to help with blood sugar understanding with foods and for close monitoring to help achieve goals of blood sugar and weight loss.  Freestyle Libre3 placed in office today.  Will monitor with Libreview and f/u in 3 months or sooner if needed.  Labs today.  Will need to review record for ophthalmic exam and microalbumin screening. Based on labs, consider addition of Farxiga for kidney and CV protection.

## 2021-05-30 LAB — CBC WITH DIFFERENTIAL/PLATELET
Basophils Absolute: 0.1 10*3/uL (ref 0.0–0.2)
Basos: 1 %
EOS (ABSOLUTE): 0.1 10*3/uL (ref 0.0–0.4)
Eos: 2 %
Hematocrit: 37.1 % (ref 34.0–46.6)
Hemoglobin: 11.5 g/dL (ref 11.1–15.9)
Immature Grans (Abs): 0 10*3/uL (ref 0.0–0.1)
Immature Granulocytes: 0 %
Lymphocytes Absolute: 3.1 10*3/uL (ref 0.7–3.1)
Lymphs: 43 %
MCH: 23.4 pg — ABNORMAL LOW (ref 26.6–33.0)
MCHC: 31 g/dL — ABNORMAL LOW (ref 31.5–35.7)
MCV: 75 fL — ABNORMAL LOW (ref 79–97)
Monocytes Absolute: 0.5 10*3/uL (ref 0.1–0.9)
Monocytes: 7 %
Neutrophils Absolute: 3.4 10*3/uL (ref 1.4–7.0)
Neutrophils: 47 %
Platelets: 490 10*3/uL — ABNORMAL HIGH (ref 150–450)
RBC: 4.92 x10E6/uL (ref 3.77–5.28)
RDW: 14.2 % (ref 11.7–15.4)
WBC: 7.2 10*3/uL (ref 3.4–10.8)

## 2021-05-30 LAB — COMPREHENSIVE METABOLIC PANEL
ALT: 13 IU/L (ref 0–32)
AST: 11 IU/L (ref 0–40)
Albumin/Globulin Ratio: 1.2 (ref 1.2–2.2)
Albumin: 4.1 g/dL (ref 3.8–4.9)
Alkaline Phosphatase: 111 IU/L (ref 44–121)
BUN/Creatinine Ratio: 10 (ref 9–23)
BUN: 7 mg/dL (ref 6–24)
Bilirubin Total: 0.3 mg/dL (ref 0.0–1.2)
CO2: 24 mmol/L (ref 20–29)
Calcium: 9.4 mg/dL (ref 8.7–10.2)
Chloride: 96 mmol/L (ref 96–106)
Creatinine, Ser: 0.67 mg/dL (ref 0.57–1.00)
Globulin, Total: 3.3 g/dL (ref 1.5–4.5)
Glucose: 281 mg/dL — ABNORMAL HIGH (ref 70–99)
Potassium: 4.4 mmol/L (ref 3.5–5.2)
Sodium: 136 mmol/L (ref 134–144)
Total Protein: 7.4 g/dL (ref 6.0–8.5)
eGFR: 104 mL/min/{1.73_m2} (ref >59)

## 2021-05-30 LAB — LIPID PANEL
Chol/HDL Ratio: 4.7 ratio — ABNORMAL HIGH (ref 0.0–4.4)
Cholesterol, Total: 242 mg/dL — ABNORMAL HIGH (ref 100–199)
HDL: 52 mg/dL (ref >39)
LDL Chol Calc (NIH): 167 mg/dL — ABNORMAL HIGH (ref 0–99)
Triglycerides: 129 mg/dL (ref 0–149)
VLDL Cholesterol Cal: 23 mg/dL (ref 5–40)

## 2021-05-30 LAB — HEMOGLOBIN A1C
Est. average glucose Bld gHb Est-mCnc: 355 mg/dL
Hgb A1c MFr Bld: 14 % — ABNORMAL HIGH (ref 4.8–5.6)

## 2021-05-30 LAB — VITAMIN D 25 HYDROXY (VIT D DEFICIENCY, FRACTURES): Vit D, 25-Hydroxy: 31.8 ng/mL (ref 30.0–100.0)

## 2021-05-30 LAB — TSH: TSH: 0.468 u[IU]/mL (ref 0.450–4.500)

## 2021-06-02 ENCOUNTER — Other Ambulatory Visit (HOSPITAL_BASED_OUTPATIENT_CLINIC_OR_DEPARTMENT_OTHER): Payer: Self-pay

## 2021-06-05 ENCOUNTER — Other Ambulatory Visit (HOSPITAL_BASED_OUTPATIENT_CLINIC_OR_DEPARTMENT_OTHER): Payer: Self-pay

## 2021-06-06 ENCOUNTER — Other Ambulatory Visit (HOSPITAL_BASED_OUTPATIENT_CLINIC_OR_DEPARTMENT_OTHER): Payer: Self-pay

## 2021-06-09 ENCOUNTER — Other Ambulatory Visit (HOSPITAL_BASED_OUTPATIENT_CLINIC_OR_DEPARTMENT_OTHER): Payer: Self-pay

## 2021-06-14 ENCOUNTER — Other Ambulatory Visit (HOSPITAL_BASED_OUTPATIENT_CLINIC_OR_DEPARTMENT_OTHER): Payer: Self-pay

## 2021-06-15 ENCOUNTER — Other Ambulatory Visit (HOSPITAL_BASED_OUTPATIENT_CLINIC_OR_DEPARTMENT_OTHER): Payer: Self-pay

## 2021-06-28 ENCOUNTER — Other Ambulatory Visit (HOSPITAL_BASED_OUTPATIENT_CLINIC_OR_DEPARTMENT_OTHER): Payer: Self-pay

## 2021-06-28 ENCOUNTER — Encounter (HOSPITAL_BASED_OUTPATIENT_CLINIC_OR_DEPARTMENT_OTHER): Payer: Self-pay | Admitting: Nurse Practitioner

## 2021-07-03 ENCOUNTER — Other Ambulatory Visit (HOSPITAL_BASED_OUTPATIENT_CLINIC_OR_DEPARTMENT_OTHER): Payer: Self-pay

## 2021-07-04 ENCOUNTER — Other Ambulatory Visit (HOSPITAL_BASED_OUTPATIENT_CLINIC_OR_DEPARTMENT_OTHER): Payer: Self-pay

## 2021-07-06 ENCOUNTER — Encounter (HOSPITAL_BASED_OUTPATIENT_CLINIC_OR_DEPARTMENT_OTHER): Payer: Self-pay | Admitting: Nurse Practitioner

## 2021-07-06 LAB — COLOGUARD: COLOGUARD: POSITIVE — AB

## 2021-07-07 ENCOUNTER — Other Ambulatory Visit (HOSPITAL_BASED_OUTPATIENT_CLINIC_OR_DEPARTMENT_OTHER): Payer: Self-pay | Admitting: Nurse Practitioner

## 2021-07-07 ENCOUNTER — Other Ambulatory Visit (HOSPITAL_BASED_OUTPATIENT_CLINIC_OR_DEPARTMENT_OTHER): Payer: Self-pay

## 2021-07-07 DIAGNOSIS — R195 Other fecal abnormalities: Secondary | ICD-10-CM

## 2021-07-10 ENCOUNTER — Encounter: Payer: Self-pay | Admitting: Gastroenterology

## 2021-07-10 ENCOUNTER — Other Ambulatory Visit (HOSPITAL_BASED_OUTPATIENT_CLINIC_OR_DEPARTMENT_OTHER): Payer: Self-pay

## 2021-07-11 ENCOUNTER — Other Ambulatory Visit (HOSPITAL_BASED_OUTPATIENT_CLINIC_OR_DEPARTMENT_OTHER): Payer: Self-pay

## 2021-07-12 ENCOUNTER — Other Ambulatory Visit (HOSPITAL_BASED_OUTPATIENT_CLINIC_OR_DEPARTMENT_OTHER): Payer: Self-pay

## 2021-07-13 ENCOUNTER — Encounter (HOSPITAL_BASED_OUTPATIENT_CLINIC_OR_DEPARTMENT_OTHER): Payer: Self-pay | Admitting: Nurse Practitioner

## 2021-07-18 ENCOUNTER — Other Ambulatory Visit (HOSPITAL_BASED_OUTPATIENT_CLINIC_OR_DEPARTMENT_OTHER): Payer: Self-pay

## 2021-07-19 ENCOUNTER — Other Ambulatory Visit (HOSPITAL_BASED_OUTPATIENT_CLINIC_OR_DEPARTMENT_OTHER): Payer: Self-pay

## 2021-07-20 ENCOUNTER — Other Ambulatory Visit (HOSPITAL_BASED_OUTPATIENT_CLINIC_OR_DEPARTMENT_OTHER): Payer: Self-pay

## 2021-07-26 ENCOUNTER — Other Ambulatory Visit (HOSPITAL_BASED_OUTPATIENT_CLINIC_OR_DEPARTMENT_OTHER): Payer: Self-pay

## 2021-08-02 ENCOUNTER — Ambulatory Visit: Payer: 59 | Admitting: Gastroenterology

## 2021-08-03 ENCOUNTER — Other Ambulatory Visit (HOSPITAL_BASED_OUTPATIENT_CLINIC_OR_DEPARTMENT_OTHER): Payer: Self-pay

## 2021-08-21 ENCOUNTER — Encounter (HOSPITAL_BASED_OUTPATIENT_CLINIC_OR_DEPARTMENT_OTHER): Payer: Self-pay | Admitting: Nurse Practitioner

## 2021-08-28 ENCOUNTER — Telehealth (HOSPITAL_BASED_OUTPATIENT_CLINIC_OR_DEPARTMENT_OTHER): Payer: 59 | Admitting: Nurse Practitioner

## 2021-10-16 ENCOUNTER — Other Ambulatory Visit (HOSPITAL_BASED_OUTPATIENT_CLINIC_OR_DEPARTMENT_OTHER): Payer: Self-pay

## 2021-10-17 ENCOUNTER — Other Ambulatory Visit (HOSPITAL_BASED_OUTPATIENT_CLINIC_OR_DEPARTMENT_OTHER): Payer: Self-pay

## 2021-11-03 ENCOUNTER — Other Ambulatory Visit (HOSPITAL_BASED_OUTPATIENT_CLINIC_OR_DEPARTMENT_OTHER): Payer: Self-pay

## 2021-11-06 ENCOUNTER — Other Ambulatory Visit (HOSPITAL_BASED_OUTPATIENT_CLINIC_OR_DEPARTMENT_OTHER): Payer: Self-pay

## 2021-11-07 ENCOUNTER — Telehealth: Payer: Self-pay

## 2021-11-07 ENCOUNTER — Other Ambulatory Visit (HOSPITAL_BASED_OUTPATIENT_CLINIC_OR_DEPARTMENT_OTHER): Payer: Self-pay

## 2021-11-07 ENCOUNTER — Encounter (HOSPITAL_BASED_OUTPATIENT_CLINIC_OR_DEPARTMENT_OTHER): Payer: Self-pay | Admitting: Pharmacist

## 2021-11-07 NOTE — Telephone Encounter (Signed)
Prior Berkley Harvey was completed and sent to cover my meds.   [employer]

## 2021-11-07 NOTE — Telephone Encounter (Signed)
Received secure chat from pharmacist stating   "Could you help me follow up on the prior authorization for Ozempic for this patient? We started this process back in February and still haven't seen any progress. We called the insurance company last month and they confirmed that it will be approved if the PA is completed but it had not been initiated at that time."  Fax received from Pharmacy to submit PA.  [employer] [id number]

## 2021-12-12 ENCOUNTER — Other Ambulatory Visit (HOSPITAL_BASED_OUTPATIENT_CLINIC_OR_DEPARTMENT_OTHER): Payer: Self-pay

## 2021-12-14 ENCOUNTER — Other Ambulatory Visit (HOSPITAL_BASED_OUTPATIENT_CLINIC_OR_DEPARTMENT_OTHER): Payer: Self-pay

## 2021-12-29 ENCOUNTER — Other Ambulatory Visit (HOSPITAL_BASED_OUTPATIENT_CLINIC_OR_DEPARTMENT_OTHER): Payer: Self-pay

## 2022-01-01 ENCOUNTER — Other Ambulatory Visit (HOSPITAL_BASED_OUTPATIENT_CLINIC_OR_DEPARTMENT_OTHER): Payer: Self-pay

## 2022-01-18 ENCOUNTER — Other Ambulatory Visit: Payer: Self-pay | Admitting: Nurse Practitioner

## 2022-01-18 DIAGNOSIS — Z1231 Encounter for screening mammogram for malignant neoplasm of breast: Secondary | ICD-10-CM

## 2022-02-23 ENCOUNTER — Other Ambulatory Visit (HOSPITAL_COMMUNITY): Payer: Self-pay

## 2022-02-26 ENCOUNTER — Ambulatory Visit: Payer: 59

## 2022-04-09 DIAGNOSIS — Z9221 Personal history of antineoplastic chemotherapy: Secondary | ICD-10-CM

## 2022-04-09 HISTORY — DX: Personal history of antineoplastic chemotherapy: Z92.21

## 2022-04-18 ENCOUNTER — Ambulatory Visit
Admission: RE | Admit: 2022-04-18 | Discharge: 2022-04-18 | Disposition: A | Discharge status: Home / Self Care | Payer: 59 | Source: Ambulatory Visit | Attending: Nurse Practitioner | Admitting: Nurse Practitioner

## 2022-04-18 DIAGNOSIS — Z1231 Encounter for screening mammogram for malignant neoplasm of breast: Secondary | ICD-10-CM

## 2022-05-23 ENCOUNTER — Encounter (HOSPITAL_BASED_OUTPATIENT_CLINIC_OR_DEPARTMENT_OTHER): Payer: Self-pay | Admitting: Emergency Medicine

## 2022-05-23 ENCOUNTER — Other Ambulatory Visit: Payer: Self-pay

## 2022-05-23 ENCOUNTER — Emergency Department (HOSPITAL_BASED_OUTPATIENT_CLINIC_OR_DEPARTMENT_OTHER)
Admission: EM | Admit: 2022-05-23 | Discharge: 2022-05-23 | Disposition: A | Discharge status: Home / Self Care | Payer: 59 | Attending: Emergency Medicine | Admitting: Emergency Medicine

## 2022-05-23 DIAGNOSIS — I10 Essential (primary) hypertension: Secondary | ICD-10-CM | POA: Diagnosis not present

## 2022-05-23 DIAGNOSIS — E119 Type 2 diabetes mellitus without complications: Secondary | ICD-10-CM | POA: Insufficient documentation

## 2022-05-23 DIAGNOSIS — X58XXXA Exposure to other specified factors, initial encounter: Secondary | ICD-10-CM | POA: Insufficient documentation

## 2022-05-23 DIAGNOSIS — H5712 Ocular pain, left eye: Secondary | ICD-10-CM | POA: Diagnosis present

## 2022-05-23 DIAGNOSIS — Z79899 Other long term (current) drug therapy: Secondary | ICD-10-CM | POA: Diagnosis not present

## 2022-05-23 DIAGNOSIS — S0502XA Injury of conjunctiva and corneal abrasion without foreign body, left eye, initial encounter: Secondary | ICD-10-CM | POA: Insufficient documentation

## 2022-05-23 MED ORDER — TETRACAINE HCL 0.5 % OP SOLN
1.0000 [drp] | Freq: Once | OPHTHALMIC | Status: AC
Start: 2022-05-23 — End: 2022-05-23
  Administered 2022-05-23: 1 [drp] via OPHTHALMIC
  Filled 2022-05-23: qty 4

## 2022-05-23 MED ORDER — OFLOXACIN 0.3 % OP SOLN: 1.0000 [drp] | Freq: Four times a day (QID) | OPHTHALMIC | 0 refills | Status: AC

## 2022-05-23 MED ORDER — FLUORESCEIN SODIUM 1 MG OP STRP
1.0000 | ORAL_STRIP | Freq: Once | OPHTHALMIC | Status: AC
  Administered 2022-05-23: 1 via OPHTHALMIC
  Filled 2022-05-23: qty 1

## 2022-05-23 NOTE — ED Triage Notes (Signed)
Pt states her left eye was bothering her yesterday but this morning, it was painful.  No known injury, no drainage.

## 2022-05-23 NOTE — ED Notes (Signed)
Cannot discharge, registration in chart.

## 2022-05-23 NOTE — ED Provider Notes (Signed)
[facility] [place] Provider Note   CSN: [id number] Arrival date & time: 05/23/22  0801     History  Chief Complaint  Patient presents with   Eye Problem    Tammy Marks is a 57 y.o. female.   Eye Problem    57 year old female with medical history significant for diabetes mellitus, HLD, HTN who presents to the emergency department with left eye pain.  The patient states that she woke up this morning with sudden onset sharp left eye pain.  She feels as if something is in her eye.  She has had significant tearing.  She has not a contact lens wear.  She denies any ocular injury or eye trauma.  She denies any vision loss.  She endorses blurry vision in the setting of her tearing.  Home Medications Prior to Admission medications   Medication Sig Start Date End Date Taking? Authorizing Provider  ofloxacin (OCUFLOX) 0.3 % ophthalmic solution Place 1 drop into the left eye 4 (four) times daily for 5 days. 05/23/22 05/28/22 Yes Regan Lemming, MD  azelastine (OPTIVAR) 0.05 % ophthalmic solution Place 1 drop into both eyes 2 (two) times daily. 05/29/21   Orma Render, NP  Continuous Blood Gluc Sensor (FREESTYLE LIBRE 3 SENSOR) MISC 1 Units by Does not apply route every 14 (fourteen) days. Place 1 sensor on the skin every 14 days. Use to check glucose continuously 05/29/21   Early, Coralee Pesa, NP  Iron, Ferrous Sulfate, 325 (65 Fe) MG TABS Take 325 mg by mouth daily. 02/13/19   Kerin Perna, NP  nystatin (MYCOSTATIN/NYSTOP) powder Apply 1 application to skin folds 3 (three) times daily as needed 05/29/21   Early, Coralee Pesa, NP  ondansetron (ZOFRAN) 8 MG tablet Take 1 tablet (8 mg total) by mouth every 8 (eight) hours as needed for nausea. 05/29/21   Orma Render, NP  Semaglutide, 1 MG/DOSE, 4 MG/3ML SOPN Inject 1 mg as directed once a week. 05/29/21   Orma Render, NP  Semaglutide,0.25 or 0.5MG/DOS, (OZEMPIC, 0.25 OR 0.5 MG/DOSE,) 2 MG/1.5ML SOPN Use 0.64m  injected into skin once a week for 4 weeks. THEN increase to 0.51minjected into skin once a week for 2 weeks to complete the first pen. 05/29/21   Early, SaCoralee PesaNP  Semaglutide,0.25 or 0.5MG/DOS, 2 MG/3ML SOPN Inject 0.5 mg into the skin once a week. 05/29/21   EaOrma RenderNP  simvastatin (ZOCOR) 20 MG tablet Take 1 tablet (20 mg total) by mouth at bedtime. 05/29/21 08/27/21  EaOrma RenderNP      Allergies    Patient has no known allergies.    Review of Systems   Review of Systems  Eyes:  Positive for pain.  All other systems reviewed and are negative.   Physical Exam Updated Vital Signs BP (!) 152/85   Pulse 87   Temp 98.2 F (36.8 C) (Oral)   Ht 5' 5"$  (1.651 m)   Wt 132 kg   LMP 06/20/2020 (Exact Date)   SpO2 99%   BMI 48.42 kg/m  Physical Exam Vitals and nursing note reviewed.  Constitutional:      General: She is not in acute distress. HENT:     Head: Normocephalic and atraumatic.  Eyes:     General: Lids are everted, no foreign bodies appreciated. Vision grossly intact. Gaze aligned appropriately.        Left eye: Discharge present.No foreign body.  Intraocular pressure: Left eye pressure is 20 mmHg. Measurements were taken using a handheld tonometer.    Extraocular Movements: Extraocular movements intact.     Conjunctiva/sclera: Conjunctivae normal.     Pupils: Pupils are equal, round, and reactive to light.     Left eye: Corneal abrasion and fluorescein uptake present. Seidel exam negative.    Slit lamp exam:    Left eye: No foreign body.     Comments:  Visual Acuity Bilateral Distance: 20/32 R Distance: 20/40 L Distance: 20/63    Cardiovascular:     Rate and Rhythm: Normal rate and regular rhythm.  Pulmonary:     Effort: Pulmonary effort is normal. No respiratory distress.  Abdominal:     General: There is no distension.     Tenderness: There is no guarding.  Musculoskeletal:        General: No deformity or signs of injury.     Cervical back:  Neck supple.  Skin:    Findings: No lesion or rash.  Neurological:     General: No focal deficit present.     Mental Status: She is alert. Mental status is at baseline.     ED Results / Procedures / Treatments   Labs (all labs ordered are listed, but only abnormal results are displayed) Labs Reviewed - No data to display  EKG None  Radiology No results found.  Procedures Procedures    Medications Ordered in ED Medications  tetracaine (PONTOCAINE) 0.5 % ophthalmic solution 1 drop (1 drop Left Eye Given by Other 05/23/22 0824)  fluorescein ophthalmic strip 1 strip (1 strip Left Eye Given by Other 05/23/22 [id number])    ED Course/ Medical Decision Making/ A&P                             Medical Decision Making Risk Prescription drug management.      58 year old female with medical history significant for diabetes mellitus, HLD, HTN who presents to the emergency department with left eye pain.  The patient states that she woke up this morning with sudden onset sharp left eye pain.  She feels as if something is in her eye.  She has had significant tearing.  She has not a contact lens wear.  She denies any ocular injury or eye trauma.  She denies any vision loss.  She endorses blurry vision in the setting of her tearing.  On arrival, the patient was medically stable.  Physical exam concerning for corneal abrasion with fluorescein uptake noted.  Intraocular pressure was normal on the left.  The patient had decreased visual acuity in the setting of her abrasion.  No ulceration, negative Seidel sign.  No evidence for ruptured globe.  No evidence for acute angle-closure glaucoma.  Intact extraocular movements.  No evidence of foreign body after lid eversion.  Patient is not a contact lens wear.  Advised the patient of her diagnosis of corneal abrasion, recommended antibiotics with ofloxacin drops, recommended pain control with tetracaine drops for 24 hours, follow-up with ophthalmology  within 48 hours.  The patient endorsed understanding.  Stable for discharge.    Final Clinical Impression(s) / ED Diagnoses Final diagnoses:  Abrasion of left cornea, initial encounter    Rx / DC Orders ED Discharge Orders          Ordered    Ambulatory referral to Ophthalmology        05/23/22 0920    ofloxacin (OCUFLOX) 0.3 %  ophthalmic solution  4 times daily        05/23/22 0933              Regan Lemming, MD 05/23/22 [phone]

## 2022-05-23 NOTE — ED Notes (Signed)
Discharge paperwork reviewed entirely with patient, including Rx's and follow up care. Pain was under control. Pt verbalized understanding as well as all parties involved. No questions or concerns voiced at the time of discharge. No acute distress noted.   Pt ambulated out to PVA without incident or assistance.

## 2022-05-23 NOTE — Discharge Instructions (Addendum)
Tetracaine drops are safe for the first 24 hours in your eye only.  Utilize ofloxacin drops for antibiotics.  Follow-up with ophthalmology outpatient in 48 hours.

## 2022-08-10 ENCOUNTER — Encounter (HOSPITAL_BASED_OUTPATIENT_CLINIC_OR_DEPARTMENT_OTHER): Payer: Self-pay | Admitting: Urology

## 2022-08-10 ENCOUNTER — Other Ambulatory Visit: Payer: Self-pay

## 2022-08-10 ENCOUNTER — Emergency Department (HOSPITAL_BASED_OUTPATIENT_CLINIC_OR_DEPARTMENT_OTHER)
Admission: EM | Admit: 2022-08-10 | Discharge: 2022-08-10 | Disposition: A | Discharge status: Home / Self Care | Payer: 59 | Attending: Emergency Medicine | Admitting: Emergency Medicine

## 2022-08-10 DIAGNOSIS — E119 Type 2 diabetes mellitus without complications: Secondary | ICD-10-CM | POA: Insufficient documentation

## 2022-08-10 DIAGNOSIS — S39012A Strain of muscle, fascia and tendon of lower back, initial encounter: Secondary | ICD-10-CM | POA: Diagnosis not present

## 2022-08-10 DIAGNOSIS — I10 Essential (primary) hypertension: Secondary | ICD-10-CM | POA: Insufficient documentation

## 2022-08-10 DIAGNOSIS — T148XXA Other injury of unspecified body region, initial encounter: Secondary | ICD-10-CM

## 2022-08-10 DIAGNOSIS — X58XXXA Exposure to other specified factors, initial encounter: Secondary | ICD-10-CM | POA: Diagnosis not present

## 2022-08-10 DIAGNOSIS — Z79899 Other long term (current) drug therapy: Secondary | ICD-10-CM | POA: Insufficient documentation

## 2022-08-10 DIAGNOSIS — M545 Low back pain, unspecified: Secondary | ICD-10-CM | POA: Diagnosis present

## 2022-08-10 LAB — URINALYSIS, W/ REFLEX TO CULTURE (INFECTION SUSPECTED)
Bilirubin Urine: NEGATIVE
Glucose, UA: >=500 mg/dL — AB
Hgb urine dipstick: NEGATIVE
Ketones, ur: NEGATIVE mg/dL
Leukocytes,Ua: NEGATIVE
Nitrite: NEGATIVE
Protein, ur: NEGATIVE mg/dL
Specific Gravity, Urine: >=1.03 (ref 1.005–1.030)
pH: 5.5 (ref 5.0–8.0)

## 2022-08-10 MED ORDER — KETOROLAC TROMETHAMINE 30 MG/ML IJ SOLN
15.0000 mg | Freq: Once | INTRAMUSCULAR | Status: AC
  Administered 2022-08-10: 15 mg via INTRAMUSCULAR
  Filled 2022-08-10: qty 1

## 2022-08-10 MED ORDER — LIDOCAINE 4 % EX PTCH: 1.0000 | MEDICATED_PATCH | Freq: Every day | CUTANEOUS | 0 refills | Status: AC | PRN

## 2022-08-10 MED ORDER — CYCLOBENZAPRINE HCL 10 MG PO TABS: 10.0000 mg | ORAL_TABLET | Freq: Two times a day (BID) | ORAL | 0 refills | Status: DC | PRN

## 2022-08-10 NOTE — ED Notes (Signed)
ED Provider at bedside. 

## 2022-08-10 NOTE — Discharge Instructions (Addendum)
We evaluated you for your back pain.  We think your symptoms are caused by a muscle strain. Please take Tylenol and Motrin for your symptoms at home.  You can take 1000 mg of Tylenol every 6 hours and 600 mg of ibuprofen every 6 hours as needed for your symptoms.  You can take these medicines together as needed, either at the same time, or alternating every 3 hours.  I have also prescribed you a muscle relaxer called Flexeril which you can take twice a day for your symptoms.  Do not mix this with alcohol or drive while taking this as it may make you sleepy.  I have given you some information about back exercises which you can try at home.  These exercises can help your muscles heal faster.  Your listed primary care provider is Georgeanne Nim, please call to set up an appointment.  If this is not your primary care doctor, I have asked our social worker to help you find one.   Please return if your symptoms worsen, you develop fevers or chills, chest pain or abdominal pain, cough, urinary symptoms, vomiting, difficulty breathing, lightheadedness or dizziness, or any other concerning symptoms.

## 2022-08-10 NOTE — ED Triage Notes (Signed)
Pt states right side back pain mid, below shoulder blade that started yesterday  Denies any known injury  Pain worse with sitting in certain positions

## 2022-08-10 NOTE — ED Provider Notes (Signed)
[facility] [place] Provider Note  CSN: [id number] Arrival date & time: 08/10/22 1556  Chief Complaint(s) Back Pain  HPI Tammy Marks is a 57 y.o. female with history of diabetes, hyperlipidemia, hypertension presenting to the emergency department with back pain.  She reports mid/low back pain on the right side.  Began spontaneously.  Does not radiate.  Worse with movement such as bending or twisting.  No chest pain or abdominal pain.  No numbness or tingling, bowel or bladder incontinence.  No fevers or chills.  No urinary symptoms.  She reports the pain improves with standing up and walking around.  Describes it as a soreness.  No syncope.  Pain has been present since yesterday.  Took Tylenol and naproxen without significant improvement.   Past Medical History Past Medical History:  Diagnosis Date   Diabetes mellitus without complication [facility])    Patient Active Problem List   Diagnosis Date Noted   Encounter for medical examination to establish care 05/29/2021   Candida infection 05/29/2021   Diabetes mellitus (HCC) 05/29/2021   Hyperlipidemia 05/29/2021   Allergic conjunctivitis of both eyes 05/29/2021   BMI 45.0-49.9, adult (HCC) 05/29/2021   Essential hypertension 06/04/2018   Home Medication(s) Prior to Admission medications   Medication Sig Start Date End Date Taking? Authorizing Provider  cyclobenzaprine (FLEXERIL) 10 MG tablet Take 1 tablet (10 mg total) by mouth 2 (two) times daily as needed for muscle spasms. 08/10/22  Yes Lonell Grandchild, MD  lidocaine (HM LIDOCAINE PATCH) 4 % Place 1 patch onto the skin daily as needed for up to 20 days (pain). 08/10/22 08/30/22 Yes Lonell Grandchild, MD  azelastine (OPTIVAR) 0.05 % ophthalmic solution Place 1 drop into both eyes 2 (two) times daily. 05/29/21   Tollie Eth, NP  Continuous Blood Gluc Sensor (FREESTYLE LIBRE 3 SENSOR) MISC 1 Units by Does not apply route every 14 (fourteen) days.  Place 1 sensor on the skin every 14 days. Use to check glucose continuously 05/29/21   Early, Sung Amabile, NP  Iron, Ferrous Sulfate, 325 (65 Fe) MG TABS Take 325 mg by mouth daily. 02/13/19   Grayce Sessions, NP  nystatin (MYCOSTATIN/NYSTOP) powder Apply 1 application to skin folds 3 (three) times daily as needed 05/29/21   Early, Sung Amabile, NP  ondansetron (ZOFRAN) 8 MG tablet Take 1 tablet (8 mg total) by mouth every 8 (eight) hours as needed for nausea. 05/29/21   Tollie Eth, NP  Semaglutide, 1 MG/DOSE, 4 MG/3ML SOPN Inject 1 mg as directed once a week. 05/29/21   Tollie Eth, NP  Semaglutide,0.25 or 0.5MG /DOS, (OZEMPIC, 0.25 OR 0.5 MG/DOSE,) 2 MG/1.5ML SOPN Use 0.25mg  injected into skin once a week for 4 weeks. THEN increase to 0.5mg  injected into skin once a week for 2 weeks to complete the first pen. 05/29/21   Early, Sung Amabile, NP  Semaglutide,0.25 or 0.5MG /DOS, 2 MG/3ML SOPN Inject 0.5 mg into the skin once a week. 05/29/21   Tollie Eth, NP  simvastatin (ZOCOR) 20 MG tablet Take 1 tablet (20 mg total) by mouth at bedtime. 05/29/21 08/27/21  Tollie Eth, NP  Past Surgical History History reviewed. No pertinent surgical history. Family History History reviewed. No pertinent family history.  Social History Social History   Tobacco Use   Smoking status: Never   Smokeless tobacco: Never  Substance Use Topics   Alcohol use: Not Currently   Drug use: Never   Allergies Patient has no known allergies.  Review of Systems Review of Systems  All other systems reviewed and are negative.   Physical Exam Vital Signs  I have reviewed the triage vital signs BP (!) 152/91 (BP Location: Right Arm)   Pulse 98   Temp 98.7 F (37.1 C) (Oral)   Resp 20   Ht 5\' 5"  (1.651 m)   Wt 132 kg   LMP 06/20/2020 (Exact Date)   SpO2 100%   BMI 48.43 kg/m  Physical  Exam Vitals and nursing note reviewed.  Constitutional:      General: She is not in acute distress.    Appearance: She is well-developed.  HENT:     Head: Normocephalic and atraumatic.     Mouth/Throat:     Mouth: Mucous membranes are moist.  Eyes:     Pupils: Pupils are equal, round, and reactive to light.  Cardiovascular:     Rate and Rhythm: Normal rate and regular rhythm.     Heart sounds: No murmur heard. Pulmonary:     Effort: Pulmonary effort is normal. No respiratory distress.     Breath sounds: Normal breath sounds.  Abdominal:     General: Abdomen is flat.     Palpations: Abdomen is soft.     Tenderness: There is no abdominal tenderness. There is no right CVA tenderness or left CVA tenderness.  Musculoskeletal:        General: No tenderness.     Right lower leg: No edema.     Left lower leg: No edema.     Comments: Left lower thoracic/upper lumbar right side paraspinal muscular tenderness  Skin:    General: Skin is warm and dry.  Neurological:     General: No focal deficit present.     Mental Status: She is alert. Mental status is at baseline.  Psychiatric:        Mood and Affect: Mood normal.        Behavior: Behavior normal.     ED Results and Treatments Labs (all labs ordered are listed, but only abnormal results are displayed) Labs Reviewed  URINALYSIS, W/ REFLEX TO CULTURE (INFECTION SUSPECTED) - Abnormal; Notable for the following components:      Result Value   Glucose, UA >=500 (*)    Bacteria, UA RARE (*)    All other components within normal limits                                                                                                                          Radiology No results found.  Pertinent labs & imaging results that were available during my care of the patient  were reviewed by me and considered in my medical decision making (see MDM for details).  Medications Ordered in ED Medications  ketorolac (TORADOL) 30 MG/ML injection  15 mg (15 mg Intramuscular Given 08/10/22 1627)                                                                                                                                     Procedures Procedures  (including critical care time)  Medical Decision Making / ED Course   MDM:  57 year old female presenting to the emergency department with mid/low back pain.  Patient well-appearing, physical exam reassuring.  Does have some's paraspinal muscular tenderness.  Suspect most likely cause is muscular pain.  No urinary symptoms to suggest urinary infection and no CVA tenderness but will check urinalysis.  Doubt nephrolithiasis, pain does not radiate.  No fevers or chills, red flag symptoms to suggest occult spinal infection.  No red flags for spinal cord compression or cauda equina syndrome.  No trauma to suggest fracture and no midline tenderness.  Doubt vascular catastrophe given quality of symptoms, improved with walking, gradual onset seems much more muscular in nature.  Will reassess.  Clinical Course as of 08/10/22 1800  Fri Aug 10, 2022  1756 Patient report her symptoms did not really improve much with Toradol.  Urinalysis unremarkable.  Very low concern for dangerous process.  Advise close follow-up with primary care physician.  Will prescribe muscle relaxer.  Discussed no drinking or driving while on muscle relaxer. Will discharge patient to home. All questions answered. Patient comfortable with plan of discharge. Return precautions discussed with patient and specified on the after visit summary.  [WS]    Clinical Course User Index [WS] Lonell Grandchild, MD        Lab Tests: -I ordered, reviewed, and interpreted labs.   The pertinent results include:   Labs Reviewed  URINALYSIS, W/ REFLEX TO CULTURE (INFECTION SUSPECTED) - Abnormal; Notable for the following components:      Result Value   Glucose, UA >=500 (*)    Bacteria, UA RARE (*)    All other components within normal  limits    Notable for not concerning for infection Medicines ordered and prescription drug management: Meds ordered this encounter  Medications   ketorolac (TORADOL) 30 MG/ML injection 15 mg   cyclobenzaprine (FLEXERIL) 10 MG tablet    Sig: Take 1 tablet (10 mg total) by mouth 2 (two) times daily as needed for muscle spasms.    Dispense:  20 tablet    Refill:  0   lidocaine (HM LIDOCAINE PATCH) 4 %    Sig: Place 1 patch onto the skin daily as needed for up to 20 days (pain).    Dispense:  20 patch    Refill:  0    -I have reviewed the patients home medicines and have made adjustments as needed   Social Determinants of Health:  Diagnosis or treatment significantly limited by social determinants of health: obesity   Reevaluation: After the interventions noted above, I reevaluated the patient and found that their symptoms have improved  Co morbidities that complicate the patient evaluation  Past Medical History:  Diagnosis Date   Diabetes mellitus without complication (HCC)       Dispostion: Disposition decision including need for hospitalization was considered, and patient discharged from emergency department.    Final Clinical Impression(s) / ED Diagnoses Final diagnoses:  Muscle strain     This chart was dictated using voice recognition software.  Despite best efforts to proofread,  errors can occur which can change the documentation meaning.    Lonell Grandchild, MD 08/10/22 1800

## 2022-08-13 ENCOUNTER — Telehealth: Payer: Self-pay

## 2022-08-13 NOTE — Transitions of Care (Post Inpatient/ED Visit) (Unsigned)
   08/13/2022  Name: Tammy Marks MRN: [id number] DOB: 1965-07-22  Today's TOC FU Call Status: Today's TOC FU Call Status:: Unsuccessul Call (1st Attempt) Unsuccessful Call (1st Attempt) Date: 08/13/22  Attempted to reach the patient regarding the most recent Inpatient/ED visit.  Follow Up Plan: Additional outreach attempts will be made to reach the patient to complete the Transitions of Care (Post Inpatient/ED visit) call.   Signature Clyda Hurdle CMA

## 2022-08-14 NOTE — Transitions of Care (Post Inpatient/ED Visit) (Signed)
   08/14/2022  Name: Tammy Marks MRN: [id number] DOB: Mar 25, 1966  Today's TOC FU Call Status: Today's TOC FU Call Status:: Unsuccessful Call (2nd Attempt) Unsuccessful Call (1st Attempt) Date: 08/13/22 Unsuccessful Call (2nd Attempt) Date: 08/14/22  Attempted to reach the patient regarding the most recent Inpatient/ED visit.  Follow Up Plan: Additional outreach attempts will be made to reach the patient to complete the Transitions of Care (Post Inpatient/ED visit) call.   Signature   RMA

## 2022-08-15 ENCOUNTER — Telehealth: Payer: Self-pay

## 2022-08-15 NOTE — Transitions of Care (Post Inpatient/ED Visit) (Signed)
   08/15/2022  Name: Tammy Marks MRN: [id number] DOB: [birth year]  Today's TOC FU Call Status: Today's TOC FU Call Status:: Unsuccessful Call (3rd Attempt) Unsuccessful Call (3rd Attempt) Date: 08/15/22  Attempted to reach the patient regarding the most recent Inpatient/ED visit.  Follow Up Plan: No further outreach attempts will be made at this time. We have been unable to contact the patient.  Signature   RMA

## 2022-10-30 ENCOUNTER — Other Ambulatory Visit: Payer: Self-pay | Admitting: Internal Medicine

## 2022-10-30 DIAGNOSIS — C189 Malignant neoplasm of colon, unspecified: Secondary | ICD-10-CM

## 2022-10-30 DIAGNOSIS — K6389 Other specified diseases of intestine: Secondary | ICD-10-CM

## 2022-10-30 DIAGNOSIS — R933 Abnormal findings on diagnostic imaging of other parts of digestive tract: Secondary | ICD-10-CM

## 2022-10-30 HISTORY — DX: Malignant neoplasm of colon, unspecified: C18.9

## 2022-11-01 ENCOUNTER — Ambulatory Visit
Admission: RE | Admit: 2022-11-01 | Discharge: 2022-11-01 | Disposition: A | Discharge status: Home / Self Care | Payer: 59 | Source: Ambulatory Visit | Attending: Internal Medicine | Admitting: Internal Medicine

## 2022-11-01 DIAGNOSIS — K6389 Other specified diseases of intestine: Secondary | ICD-10-CM

## 2022-11-01 DIAGNOSIS — R933 Abnormal findings on diagnostic imaging of other parts of digestive tract: Secondary | ICD-10-CM

## 2022-11-01 MED ORDER — IOPAMIDOL (ISOVUE-300) INJECTION 61%
100.0000 mL | Freq: Once | INTRAVENOUS | Status: AC | PRN
  Administered 2022-11-01: 100 mL via INTRAVENOUS

## 2022-11-05 ENCOUNTER — Encounter: Payer: Self-pay | Admitting: Internal Medicine

## 2022-11-06 ENCOUNTER — Encounter: Payer: Self-pay | Admitting: Hematology

## 2022-11-06 ENCOUNTER — Other Ambulatory Visit: Payer: Self-pay

## 2022-11-06 ENCOUNTER — Telehealth: Payer: Self-pay | Admitting: Nurse Practitioner

## 2022-11-06 ENCOUNTER — Inpatient Hospital Stay: Payer: 59 | Attending: Hematology | Admitting: Hematology

## 2022-11-06 ENCOUNTER — Other Ambulatory Visit: Payer: 59

## 2022-11-06 ENCOUNTER — Encounter: Payer: Self-pay | Admitting: Internal Medicine

## 2022-11-06 ENCOUNTER — Ambulatory Visit: Payer: Self-pay | Admitting: General Surgery

## 2022-11-06 ENCOUNTER — Inpatient Hospital Stay: Payer: 59

## 2022-11-06 ENCOUNTER — Telehealth: Payer: Self-pay

## 2022-11-06 ENCOUNTER — Telehealth: Payer: Self-pay | Admitting: Hematology

## 2022-11-06 VITALS — BP 132/78 | HR 109 | Temp 97.8°F | Resp 15 | Ht 64.5 in | Wt 279.6 lb

## 2022-11-06 DIAGNOSIS — C186 Malignant neoplasm of descending colon: Secondary | ICD-10-CM | POA: Diagnosis not present

## 2022-11-06 DIAGNOSIS — K6389 Other specified diseases of intestine: Secondary | ICD-10-CM

## 2022-11-06 DIAGNOSIS — D649 Anemia, unspecified: Secondary | ICD-10-CM | POA: Insufficient documentation

## 2022-11-06 DIAGNOSIS — Z7984 Long term (current) use of oral hypoglycemic drugs: Secondary | ICD-10-CM | POA: Insufficient documentation

## 2022-11-06 DIAGNOSIS — E119 Type 2 diabetes mellitus without complications: Secondary | ICD-10-CM | POA: Diagnosis not present

## 2022-11-06 DIAGNOSIS — E11628 Type 2 diabetes mellitus with other skin complications: Secondary | ICD-10-CM

## 2022-11-06 LAB — CMP (CANCER CENTER ONLY)
ALT: 8 U/L (ref 0–44)
AST: 11 U/L — ABNORMAL LOW (ref 15–41)
Albumin: 3.7 g/dL (ref 3.5–5.0)
Alkaline Phosphatase: 80 U/L (ref 38–126)
Anion gap: 12 (ref 5–15)
BUN: 9 mg/dL (ref 6–20)
CO2: 24 mmol/L (ref 22–32)
Calcium: 8.9 mg/dL (ref 8.9–10.3)
Chloride: 101 mmol/L (ref 98–111)
Creatinine: 0.74 mg/dL (ref 0.44–1.00)
GFR, Estimated: >60 mL/min (ref >60)
Glucose, Bld: 240 mg/dL — ABNORMAL HIGH (ref 70–99)
Potassium: 3.6 mmol/L (ref 3.5–5.1)
Sodium: 137 mmol/L (ref 135–145)
Total Bilirubin: 0.3 mg/dL (ref 0.3–1.2)
Total Protein: 7.7 g/dL (ref 6.5–8.1)

## 2022-11-06 LAB — CBC WITH DIFFERENTIAL (CANCER CENTER ONLY)
Abs Immature Granulocytes: 0.04 10*3/uL (ref 0.00–0.07)
Basophils Absolute: 0 10*3/uL (ref 0.0–0.1)
Basophils Relative: 0 %
Eosinophils Absolute: 0.1 10*3/uL (ref 0.0–0.5)
Eosinophils Relative: 1 %
HCT: 22.6 % — ABNORMAL LOW (ref 36.0–46.0)
Hemoglobin: 6.3 g/dL — CL (ref 12.0–15.0)
Immature Granulocytes: 0 %
Lymphocytes Relative: 26 %
Lymphs Abs: 2.4 10*3/uL (ref 0.7–4.0)
MCH: 16.5 pg — ABNORMAL LOW (ref 26.0–34.0)
MCHC: 27.9 g/dL — ABNORMAL LOW (ref 30.0–36.0)
MCV: 59.2 fL — ABNORMAL LOW (ref 80.0–100.0)
Monocytes Absolute: 0.7 10*3/uL (ref 0.1–1.0)
Monocytes Relative: 7 %
Neutro Abs: 6.2 10*3/uL (ref 1.7–7.7)
Neutrophils Relative %: 66 %
Platelet Count: 797 10*3/uL — ABNORMAL HIGH (ref 150–400)
RBC: 3.82 MIL/uL — ABNORMAL LOW (ref 3.87–5.11)
RDW: 18.6 % — ABNORMAL HIGH (ref 11.5–15.5)
WBC Count: 9.5 10*3/uL (ref 4.0–10.5)
nRBC: 0 % (ref 0.0–0.2)

## 2022-11-06 LAB — CEA (ACCESS): CEA (CHCC): 27.14 ng/mL — ABNORMAL HIGH (ref 0.00–5.00)

## 2022-11-06 NOTE — Progress Notes (Signed)
[facility]   [phone] Fax:([phone]   Clinic New Consult Note   Patient Care Team: Early, Sung Amabile, NP as PCP - General (Nurse Practitioner)  Date of Service:  11/06/2022   CHIEF COMPLAINTS/PURPOSE OF CONSULTATION:  Colonic mass   REFERRING PHYSICIAN:  Lynann Bologna, DO   ASSESSMENT & PLAN:  Tammy Marks is a 57 y.o.  female with a history of   1. Left colon cancer and rectosigmoid adenocarcinoma, MMR normal  -Patient presented with mild hematochezia, and had a screening colonoscopy.  I discussed the findings with patient in detail. -She has 2 synchronized colorectal cancer.  The left descending colon cancer was only 4 mm, removed by polypectomy, but she likely still need a left hemicolectomy due to the invasive nature of her cancer. -The largest 3 cm, fungating mass in the rectosigmoid junction is 15 cm from anal verge on colonoscopy, biopsy confirmed moderate differentiated adenocarcinoma -CT scan showed multiple slightly enlarged regional lymph nodes, concerning for nodal metastasis.  She also has a small indeterminate liver lesion, I recommend abdominal MRI with and without contrast for further evaluation.  I will also obtain pelvic MRI with rectal protocol for her rectosigmoid colon cancer staging, and CT chest to complete staging. -If her staging scan are negative for distant metastasis, she probably will be a candidate for upfront surgery due to the location in the proximal rectum/sigmoid junction. -We also discussed the role of neoadjuvant chemotherapy and adjuvant chemo, based on the further evaluation by MRI and surgical path, and multidisciplinary discussion in tumor board next week. -I plan to see her back after the imaging tests. -she has a appointment with colorectal surgeon Dr. Maisie Fus today. -Will obtain baseline labs including CBC, CMP, and CEA today   DM -Oral medicine, previously not well-controlled, better lately, per  patient. -Will obtain hemoglobin A1c today  Severe anemia -Her hemoglobin is 6.3 today, will arrange blood transfusion for her in the next few days. -Will check iron study to see if she needs IV iron  PLAN:  -discuss CT scan, colonosocpy and biopsy results w/ pt -recommend Abdominal MRI and pelvic MRI per rectal protocol  -recommend CT chest for staging  -I will see her back after the above workup, will review her case in GI tumor board. -2 units of blood transfusion in the next few days, and add on iron study to see if she needs IV iron. -She will see colorectal surgeon Dr. Maisie Fus later today   HISTORY OF PRESENTING ILLNESS:  Tammy Marks 57 y.o. female is a here because of Colonic mass . The patient was referred by Lynann Bologna, DO . The patient presents to the clinic today alone.  She presents for mild intermittent rectal bleeding for the past few months, no change of bowel habit, constipation, diarrhea, or frequent bowel movement.  She denies any abdominal pain, nausea, or other GI symptoms.  Her energy level and appetite are overall unchanged.  She was sent for GI for screening colonoscopy, which was done on October 30 2022 by Dr. Lorenso Quarry.  It showed a 4 mm polyps in the descending colon, removed, pathology showed invasive adenocarcinoma arising in tubular adenoma with high-grade dysplasia, MMR normal.  Resection margins were negative.  It also showed a partially obstructing tumor measuring 3 cm in the rectosigmoid colon, 15 cm proximal to the anus, biopsy confirmed moderate differentiated adenocarcinoma, MMR normal.  She was referred to [place] for further evaluation.  Her past  medical history is significant for type 2 diabetes, currently on oral medicine.  Her A1c was high in the past, she does not remember the last results which was done 3 months ago.  She denies any peripheral neuropathy or other complications from diabetes.   She has a PMHx of.... Diabetes Mellitus Gall Bladder  Surgery 2 Caesarean Father- gastric cancer   Socially... Single 2 children   REVIEW OF SYSTEMS:    Constitutional: (-)Denies fevers, chills or abnormal night sweats Eyes: (-)Denies blurriness of vision, double vision or watery eyes Ears, nose, mouth, throat, and face: (-) Denies mucositis or sore throat Respiratory:(-) Denies cough, dyspnea or wheezes Cardiovascular: (-)Denies palpitation, chest discomfort or lower extremity swelling Gastrointestinal: (-0 Denies nausea, heartburn or change in bowel habits Skin: (-)Denies abnormal skin rashes Lymphatics: Denies new lymphadenopathy or easy bruising Neurological:(-) Denies numbness, tingling or new weaknesses Behavioral/Psych: (-)Mood is stable, no new changes  All other systems were reviewed with the patient and are negative.   MEDICAL HISTORY:  Past Medical History:  Diagnosis Date   Diabetes mellitus without complication (HCC)     SURGICAL HISTORY: Past Surgical History:  Procedure Laterality Date   CESAREAN SECTION     CHOLECYSTECTOMY      SOCIAL HISTORY: Social History   Socioeconomic History   Marital status: Single    Spouse name: Not on file   Number of children: 2   Years of education: Not on file   Highest education level: Not on file  Occupational History   Not on file  Tobacco Use   Smoking status: Never   Smokeless tobacco: Never  Substance and Sexual Activity   Alcohol use: Not Currently   Drug use: Never   Sexual activity: Not Currently  Other Topics Concern   Not on file  Social History Narrative   Not on file   Social Determinants of Health   Financial Resource Strain: Not on file  Food Insecurity: Not on file  Transportation Needs: Not on file  Physical Activity: Not on file  Stress: Not on file  Social Connections: Not on file  Intimate Partner Violence: Not on file    FAMILY HISTORY: Family History  Problem Relation Age of Onset   Cancer Father 46       gastric cancer     ALLERGIES:  has No Known Allergies.  MEDICATIONS:  Current Outpatient Medications  Medication Sig Dispense Refill   azelastine (OPTIVAR) 0.05 % ophthalmic solution Place 1 drop into both eyes 2 (two) times daily. 6 mL 12   Iron, Ferrous Sulfate, 325 (65 Fe) MG TABS Take 325 mg by mouth daily. 30 tablet 11   nystatin (MYCOSTATIN/NYSTOP) powder Apply 1 application to skin folds 3 (three) times daily as needed 60 g 2   simvastatin (ZOCOR) 20 MG tablet Take 1 tablet (20 mg total) by mouth at bedtime. 90 tablet 3   No current facility-administered medications for this visit.    PHYSICAL EXAMINATION: ECOG PERFORMANCE STATUS: 0 - Asymptomatic  Vitals:   11/06/22 0815  BP: 132/78  Pulse: (!) 109  Resp: 15  Temp: 97.8 F (36.6 C)  SpO2: 100%   Filed Weights   11/06/22 0815  Weight: 279 lb 9.6 oz (126.8 kg)     GENERAL:alert, no distress and comfortable SKIN: skin color normal, no rashes or significant lesions EYES: normal, Conjunctiva are pink and non-injected, sclera clear  NEURO: alert & oriented x 3 with fluent speech NECK:(-) supple, thyroid normal size, non-tender, without  nodularity LYMPH:  (-)no palpable lymphadenopathy in the cervical, axillary  LUNGS: (-)clear to auscultation and percussion with normal breathing effort HEART: (-)regular rate & rhythm and no murmurs and no lower extremity edema ABDOMEN:(-)abdomen soft,(-) non-tender and (-)normal bowel sounds   LABORATORY DATA:  I have reviewed the data as listed    Latest Ref Rng & Units 05/29/2021    1:18 PM 02/24/2019    4:47 PM 02/12/2019   12:01 PM  CBC  WBC 3.4 - 10.8 x10E3/uL 7.2  10.2  9.0   Hemoglobin 11.1 - 15.9 g/dL 16.1  09.6  04.5   Hematocrit 34.0 - 46.6 % 37.1  33.1  33.4   Platelets 150 - 450 x10E3/uL 490  488  521        Latest Ref Rng & Units 05/29/2021    1:18 PM 02/24/2019    4:47 PM  CMP  Glucose 70 - 99 mg/dL 409  811   BUN 6 - 24 mg/dL 7  7   Creatinine 9.14 - 1.00 mg/dL 7.82   9.56   Sodium 213 - 144 mmol/L 136  135   Potassium 3.5 - 5.2 mmol/L 4.4  3.9   Chloride 96 - 106 mmol/L 96  100   CO2 20 - 29 mmol/L 24  24   Calcium 8.7 - 10.2 mg/dL 9.4  8.9   Total Protein 6.0 - 8.5 g/dL 7.4  7.1   Total Bilirubin 0.0 - 1.2 mg/dL 0.3  0.4   Alkaline Phos 44 - 121 IU/L 111  68   AST 0 - 40 IU/L 11  17   ALT 0 - 32 IU/L 13  14      RADIOGRAPHIC STUDIES: I have personally reviewed the radiological images as listed and agreed with the findings in the report. CT ABDOMEN PELVIS W CONTRAST  Result Date: 11/01/2022 CLINICAL DATA:  Recently diagnosed colon carcinoma by colonoscopy. Staging. * Tracking Code: BO * EXAM: CT ABDOMEN AND PELVIS WITH CONTRAST TECHNIQUE: Multidetector CT imaging of the abdomen and pelvis was performed using the standard protocol following bolus administration of intravenous contrast. RADIATION DOSE REDUCTION: This exam was performed according to the departmental dose-optimization program which includes automated exposure control, adjustment of the mA and/or kV according to patient size and/or use of iterative reconstruction technique. CONTRAST:  ISOVUE-300 IOPAMIDOL (ISOVUE-300) INJECTION 61% COMPARISON:  None Available. FINDINGS: Lower Chest: No acute findings. Hepatobiliary: A small hypovascular mass is seen in segment 7 of the right hepatic lobe on image 21/2, which has nonspecific characteristics. Liver metastasis cannot be excluded. No other liver lesions identified. Prior cholecystectomy. No evidence of biliary obstruction. Pancreas:  No mass or inflammatory changes. Spleen: Within normal limits in size and appearance. Adrenals/Urinary Tract: No suspicious masses identified. No evidence of ureteral calculi or hydronephrosis. Stomach/Bowel: Short-segment concentric wall thickening is seen in the upper rectum measuring approximately 3.3 cm in length (see image 71/2), consistent with primary carcinoma. No evidence of bowel obstruction.  Vascular/Lymphatic: Multiple small mesorectal lymph nodes are seen adjacent to the upper rectum measuring up to 10 mm, suspicious for lymph node metastases. No extra-mesorectal lymphadenopathy seen in the pelvis. No pathologically enlarged lymph nodes seen within the abdomen. No acute vascular findings. Reproductive:  No mass or other significant abnormality. Other:  None. Musculoskeletal:  No suspicious bone lesions identified. IMPRESSION: Short-segment concentric wall thickening in the upper rectum, consistent with primary rectal carcinoma. Multiple small mesorectal lymph nodes measuring up to 10 mm, highly suspicious for local  lymph node metastases. Small hypovascular mass in right hepatic lobe, which has nonspecific characteristics. Liver metastasis cannot be excluded. Abdomen MRI without and with contrast is recommended for further characterization. Electronically Signed   By: Danae Orleans M.D.   On: 11/01/2022 10:43     Orders Placed This Encounter  Procedures   MR Abdomen W Wo Contrast    Standing Status:   Future    Standing Expiration Date:   11/06/2023    Order Specific Question:   If indicated for the ordered procedure, I authorize the administration of contrast media per Radiology protocol    Answer:   Yes    Order Specific Question:   What is the patient's sedation requirement?    Answer:   No Sedation    Order Specific Question:   Does the patient have a pacemaker or implanted devices?    Answer:   No    Order Specific Question:   Preferred imaging location?    Answer:   [facility] (table limit - 550 lbs)   CT Chest Wo Contrast    Standing Status:   Future    Standing Expiration Date:   11/06/2023    Order Specific Question:   Is patient pregnant?    Answer:   No    Order Specific Question:   Preferred imaging location?    Answer:   [facility]    Order Specific Question:   Release to patient    Answer:   Immediate [1]   CBC with Differential (Cancer Center  Only)    Standing Status:   Future    Number of Occurrences:   1    Standing Expiration Date:   11/06/2023   CMP (Cancer Center only)    Standing Status:   Future    Number of Occurrences:   1    Standing Expiration Date:   11/06/2023   [facility] (Access[facility] ONLY    Standing Status:   Future    Number of Occurrences:   1    Standing Expiration Date:   11/06/2023    All questions were answered. The patient knows to call the clinic with any problems, questions or concerns. The total time spent in the appointment was 60 minutes.     Malachy Mood, MD 11/06/2022   Carolin Coy am acting as scribe for Malachy Mood, MD.   I have reviewed the above documentation for accuracy and completeness, and I agree with the above.

## 2022-11-06 NOTE — Telephone Encounter (Signed)
CRITICAL VALUE STICKER  CRITICAL VALUE: HGB-6.3  RECEIVER (on-site recipient of call):  P. LPN   DATE & TIME NOTIFIED: [birth year] 25, 1965  MESSENGER (representative from lab): Pam   MD NOTIFIED:  Dr. Mosetta Putt  TIME OF NOTIFICATION: 9:20am

## 2022-11-06 NOTE — Progress Notes (Signed)
I met with Ms Tomes after her consultation with Dr Mosetta Putt.  I explained my role as a nurse navigator and provided my contact information.  It told her central scheduling will call to scheduled her CT Chest and MRI.  I told her I will follow for results and if she needs to see Dr Mosetta Putt after that I will schedule that appt for her.  All questions were answered.  She verbalized understanding.

## 2022-11-06 NOTE — Telephone Encounter (Signed)
Critical Lab Value reported: Hbg 6.3  Dr. Mosetta Putt notified.  Spoke with pt via telephone regarding critical hgb.  Pt stated she's unable to come back to the Cancer Center at this moment d/t another doctor's appt scheduled this morning.  Pt stated she could come back later today for additional lab draw (T&S plus ABO/RH) but would not be able to come for a blood transfusion until Saturday 11/10/2022.  Explained to pt that the T&S is only good for 72hrs from the time it is drawn.  Pt verbalized understanding and agreed to come in on Thursday 11/08/2022 to have T&S plus ABO drawn.  Appts for both were scheduled and pt made aware.

## 2022-11-06 NOTE — Telephone Encounter (Signed)
Recv'd P.A. for Ozempic but don't see that [facility] has prescribed this in long time, called pt to see if she is even on this.  Pt states she never started this due to insurance not covering & see's endocrinologist & they have her on Metformin & Glipizide.  She is seeing Oncology for polyps found recently on colonoscopy, has lots going on right now.  She will call when she wants to schedule with [facility].

## 2022-11-06 NOTE — Progress Notes (Signed)
 REFERRING PHYSICIAN:  [name]  PROVIDER:  BERNARDA WANDA NED, MD  MRN: [id number] DOB: April 08, 1966 DATE OF ENCOUNTER: 11/06/2022  Subjective   Chief Complaint: New Consultation (Colon Cancer)     History of Present Illness: Tammy Marks is a 57 y.o. female who is seen today as an office consultation at the request of Dr. Kriss for evaluation of New Consultation (Colon Cancer) .  57 year old female who underwent screening colonoscopy in late June 2024.  She had noticed a small amount of bright red blood with bowel movements.  During colonoscopy a rectosigmoid mass was noted and biopsies were obtained.  The area was tattooed.  A 4 mm polyp was also found in the descending colon.  This was removed completely.  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  An invasive adenocarcinoma was noted within the rectosigmoid colon as well.  CT scans of the abdomen and pelvis show some enlarged regional lymph nodes as well as a proximal rectal mass.  There was a lesion in the liver that was unable to be characterized on CT scan.  MRI of the pelvis, MRI of the abdomen and CT chest have been ordered to complete metastatic workup.   Review of Systems: A complete review of systems was obtained from the patient.  I have reviewed this information and discussed as appropriate with the patient.  See HPI as well for other ROS.   Medical History: Past Medical History:  Diagnosis Date  . Diabetes mellitus without complication (CMS/HHS-HCC)   . History of cancer     There is no problem list on file for this patient.   Past Surgical History:  Procedure Laterality Date  . CESAREAN SECTION     x2  . CHOLECYSTECTOMY    . HERNIA REPAIR       No Known Allergies  Current Outpatient Medications on File Prior to Visit  Medication Sig Dispense Refill  . atorvastatin  (LIPITOR) 20 MG tablet     . glipiZIDE  (GLUCOTROL  XL) 10 MG XL  tablet Take 10 mg by mouth once daily    . metFORMIN  (GLUCOPHAGE -XR) 500 MG XR tablet TAKE 2 TABLETS BY MOUTH WITH A MEAL TWICE DAILY     No current facility-administered medications on file prior to visit.    Family History  Problem Relation Age of Onset  . Skin cancer Mother   . High blood pressure (Hypertension) Mother   . Obesity Father   . High blood pressure (Hypertension) Father   . Coronary Artery Disease (Blocked arteries around heart) Father   . Diabetes Father      Social History   Tobacco Use  Smoking Status Never  Smokeless Tobacco Never     Social History   Socioeconomic History  . Marital status: Single  Tobacco Use  . Smoking status: Never  . Smokeless tobacco: Never  Vaping Use  . Vaping status: Never Used  Substance and Sexual Activity  . Alcohol use: Not Currently  . Drug use: Never    Objective:    Vitals:   11/06/22 1003 11/06/22 1006  BP: (!) 130/90   Pulse: (!) 130   Temp: 36.9 C (98.4 F)   SpO2: 98%   Weight: (!) 126.7 kg (279 lb 6.4 oz)   Height: 165.1 cm (5' 5)   PainSc:  0-No pain     Exam Gen: NAD Abd: soft, vertical lower midline inc scar    Labs, Imaging and Diagnostic Testing: Most recent  lab work reviewed.  Colonoscopy report and images reviewed.  Oncology note reviewed.  CT scan images and report reviewed.  Assessment and Plan:  Diagnoses and all orders for this visit:  Overlapping malignant neoplasm of colon (CMS/HHS-HCC)    57 year old obese female who presented to the office after colonoscopy due to rectal bleeding.  She was noted to have a rectosigmoid mass which was biopsied and tattooed.  Pathology shows adenocarcinoma.  She also had a descending colon polyp that was resected and removed that showed invasive adenocarcinoma within 1 mm of the margin.  She is completing her metastatic workup and will get MRI of the abdomen and pelvis along with CT chest.  We will await the results of these test.  She also  underwent lab work this morning which shows significant anemia.  The cancer center is working to get this corrected.  Depending on the results of her workup, she would be most likely a candidate for a left colectomy.  I have asked Dr. Kriss to tattoo the descending polypectomy site so that we can remove this completely during surgery.  We also discussed her diabetes management as her last hemoglobin A1c was greater than 14.  I have asked for her to have this repeated and reevaluated by her primary care physician to determine if she is in optimal help for colon surgery.  The surgery and anatomy were described to the patient as well as the risks of surgery and the possible complications.  These include: Bleeding, deep abdominal infections and possible wound complications such as hernia and infection, damage to adjacent structures, leak of surgical connections, which can lead to other surgeries and possibly an ostomy, possible need for other procedures, such as abscess drains in radiology, possible prolonged hospital stay, possible diarrhea from removal of part of the colon, possible constipation from narcotics, possible bowel, bladder or sexual dysfunction if having rectal surgery, prolonged fatigue/weakness or appetite loss, possible early recurrence of of disease, possible complications of their medical problems such as heart disease or arrhythmias or lung problems, death (less than 1%). I believe the patient understands and wishes to proceed with the surgery.   Bernarda JAYSON Ned, MD Colon and Rectal Surgery [facility]

## 2022-11-07 ENCOUNTER — Other Ambulatory Visit: Payer: Self-pay

## 2022-11-07 DIAGNOSIS — D649 Anemia, unspecified: Secondary | ICD-10-CM

## 2022-11-07 DIAGNOSIS — K6389 Other specified diseases of intestine: Secondary | ICD-10-CM

## 2022-11-08 ENCOUNTER — Inpatient Hospital Stay: Payer: 59 | Attending: Hematology

## 2022-11-08 ENCOUNTER — Other Ambulatory Visit: Payer: Self-pay

## 2022-11-08 ENCOUNTER — Telehealth: Payer: Self-pay

## 2022-11-08 DIAGNOSIS — D649 Anemia, unspecified: Secondary | ICD-10-CM

## 2022-11-08 DIAGNOSIS — D500 Iron deficiency anemia secondary to blood loss (chronic): Secondary | ICD-10-CM | POA: Diagnosis not present

## 2022-11-08 DIAGNOSIS — C186 Malignant neoplasm of descending colon: Secondary | ICD-10-CM | POA: Diagnosis present

## 2022-11-08 DIAGNOSIS — E11628 Type 2 diabetes mellitus with other skin complications: Secondary | ICD-10-CM

## 2022-11-08 DIAGNOSIS — K6389 Other specified diseases of intestine: Secondary | ICD-10-CM

## 2022-11-08 LAB — CBC WITH DIFFERENTIAL (CANCER CENTER ONLY)
Abs Immature Granulocytes: 0.03 10*3/uL (ref 0.00–0.07)
Basophils Absolute: 0.1 10*3/uL (ref 0.0–0.1)
Basophils Relative: 1 %
Eosinophils Absolute: 0.1 10*3/uL (ref 0.0–0.5)
Eosinophils Relative: 1 %
HCT: 22.8 % — ABNORMAL LOW (ref 36.0–46.0)
Hemoglobin: 6.4 g/dL — CL (ref 12.0–15.0)
Immature Granulocytes: 0 %
Lymphocytes Relative: 37 %
Lymphs Abs: 3.8 10*3/uL (ref 0.7–4.0)
MCH: 16.4 pg — ABNORMAL LOW (ref 26.0–34.0)
MCHC: 28.1 g/dL — ABNORMAL LOW (ref 30.0–36.0)
MCV: 58.5 fL — ABNORMAL LOW (ref 80.0–100.0)
Monocytes Absolute: 0.7 10*3/uL (ref 0.1–1.0)
Monocytes Relative: 7 %
Neutro Abs: 5.5 10*3/uL (ref 1.7–7.7)
Neutrophils Relative %: 54 %
Platelet Count: 827 10*3/uL — ABNORMAL HIGH (ref 150–400)
RBC: 3.9 MIL/uL (ref 3.87–5.11)
RDW: 18.8 % — ABNORMAL HIGH (ref 11.5–15.5)
WBC Count: 10.3 10*3/uL (ref 4.0–10.5)
nRBC: 0 % (ref 0.0–0.2)

## 2022-11-08 LAB — CMP (CANCER CENTER ONLY)
ALT: 9 U/L (ref 0–44)
AST: 12 U/L — ABNORMAL LOW (ref 15–41)
Albumin: 4.1 g/dL (ref 3.5–5.0)
Alkaline Phosphatase: 87 U/L (ref 38–126)
Anion gap: 11 (ref 5–15)
BUN: 9 mg/dL (ref 6–20)
CO2: 24 mmol/L (ref 22–32)
Calcium: 9 mg/dL (ref 8.9–10.3)
Chloride: 100 mmol/L (ref 98–111)
Creatinine: 0.7 mg/dL (ref 0.44–1.00)
GFR, Estimated: >60 mL/min (ref >60)
Glucose, Bld: 155 mg/dL — ABNORMAL HIGH (ref 70–99)
Potassium: 3.7 mmol/L (ref 3.5–5.1)
Sodium: 135 mmol/L (ref 135–145)
Total Bilirubin: 0.3 mg/dL (ref 0.3–1.2)
Total Protein: 8.2 g/dL — ABNORMAL HIGH (ref 6.5–8.1)

## 2022-11-08 LAB — IRON AND IRON BINDING CAPACITY (CC-WL,HP ONLY)
Iron: 22 ug/dL — ABNORMAL LOW (ref 28–170)
Saturation Ratios: 3 % — ABNORMAL LOW (ref 10.4–31.8)
TIBC: 659 ug/dL — ABNORMAL HIGH (ref 250–450)
UIBC: 637 ug/dL

## 2022-11-08 LAB — PREPARE RBC (CROSSMATCH)

## 2022-11-08 LAB — TYPE AND SCREEN
ABO/RH(D): A POS
Antibody Screen: NEGATIVE

## 2022-11-08 LAB — HEMOGLOBIN A1C
Hgb A1c MFr Bld: 9.8 % — ABNORMAL HIGH (ref 4.8–5.6)
Mean Plasma Glucose: 234.56 mg/dL

## 2022-11-08 NOTE — Telephone Encounter (Signed)
Critical lab value reported: Hbg 6.4 Notified Dr. Mosetta Putt.  Pt coming in on Saturday for blood transfusion.

## 2022-11-09 ENCOUNTER — Other Ambulatory Visit: Payer: Self-pay

## 2022-11-10 ENCOUNTER — Other Ambulatory Visit: Payer: Self-pay

## 2022-11-10 ENCOUNTER — Inpatient Hospital Stay: Payer: 59

## 2022-11-10 DIAGNOSIS — C186 Malignant neoplasm of descending colon: Secondary | ICD-10-CM | POA: Diagnosis not present

## 2022-11-10 DIAGNOSIS — K6389 Other specified diseases of intestine: Secondary | ICD-10-CM

## 2022-11-10 DIAGNOSIS — D649 Anemia, unspecified: Secondary | ICD-10-CM

## 2022-11-10 MED ORDER — SODIUM CHLORIDE 0.9% IV SOLUTION: 250.0000 mL | Freq: Once | INTRAVENOUS | Status: DC

## 2022-11-10 NOTE — Patient Instructions (Signed)
Blood Transfusion, Adult A blood transfusion is a procedure in which you receive blood or a type of blood cell (blood component) through an IV. You may need a blood transfusion when you have a low blood count, which is a low number of any blood cell. This may result from a bleeding disorder, illness, injury, or surgery. The blood may come from a donor, or you may be able to have your own blood collected and stored (autologous blood donation) before a planned surgery. The blood given in a transfusion may be made up of different blood components. You may receive: Red blood cells. These carry oxygen to the cells in the body. Platelets. These help your blood to clot. Plasma. This is the liquid part of your blood. It carries proteins and other substances throughout the body. White blood cells. These help you fight infections. If you have hemophilia or another clotting disorder, you may also receive other types of blood products. Depending on the type of blood product, this procedure may take 1-4 hours to complete. Tell a health care provider about: Any bleeding problems you have. Any previous reactions you have had during a blood transfusion. Any allergies you have. All medicines you are taking, including vitamins, herbs, eye drops, creams, and over-the-counter medicines. Any surgeries you have had. Any medical conditions you have. Whether you are pregnant or may be pregnant. What are the risks? Talk with your health care provider about risks. The most common problems include: A mild allergic reaction, such as red, swollen areas of skin (hives) and itching. Fever or chills. This may be the body's response to new blood cells received. This may occur during or up to 4 hours after the transfusion. More serious problems may include: A serious allergic reaction that causes difficulty breathing or swelling around the face and lips. Transfusion-associated circulatory overload (TACO), or too much fluid in  the lungs. This may cause breathing problems. Transfusion-related acute lung injury (TRALI), which causes breathing difficulty and low oxygen in the blood. This can occur within hours of the transfusion or several days later. Iron overload. This can happen after receiving many blood transfusions over a period of time. Infection or virus being transmitted. This is rare because donated blood is carefully tested before it is given. Hemolytic transfusion reaction. This is rare. It happens when the body's defense system (immune system)tries to attack the new blood cells. Symptoms may include fever, chills, nausea, low blood pressure, and low back or chest pain. Transfusion-associated graft-versus-host disease (TAGVHD). This is rare. It happens when donated cells attack the body's healthy tissues. What happens before the procedure? You will have a blood test to check your blood type. This test is done to know what kind of blood your body will accept and to match it to the donor blood. If you are going to have a planned surgery, you may be able to do an autologous blood donation. This may be done in case you need to have a transfusion. You will have your temperature, blood pressure, and pulse checked before the transfusion. If you have had an allergic reaction to a transfusion in the past, you may be given medicine to help prevent a reaction. This medicine may be given to you by mouth (orally) or through an IV. What happens during the procedure?  An IV will be inserted into one of your veins. The bag of blood will be attached to your IV. The blood will then enter through your vein. Your temperature, blood pressure,   and pulse will be monitored during the transfusion. This monitoring is done to detect early signs of a transfusion reaction. Tell your nurse right away if you have any of these symptoms during the transfusion: Shortness of breath or trouble breathing. Chest or back pain. Fever or  chills. Itching or hives. If you have any signs or symptoms of a reaction, your transfusion will be stopped and you may be given medicine. When the transfusion is complete, your IV will be removed. Pressure may be applied to the IV site for a few minutes. A bandage (dressing)will be applied. The procedure may vary among health care providers and hospitals. What happens after the procedure? Your temperature, blood pressure, pulse, breathing rate, and blood oxygen level will be monitored until you leave the hospital or clinic. Your blood may be tested to see how you have responded to the transfusion. You may be warmed with fluids or blankets to maintain a normal body temperature. If you receive your blood transfusion in an outpatient setting, you will be told whom to contact to report any reactions. Where to find more information Visit the [employer]: redcross.org Summary A blood transfusion is a procedure in which you receive blood or a type of blood cell (blood component) through an IV. The blood given in a transfusion may be made up of different blood components. You may receive red blood cells, platelets, plasma, or white blood cells depending on the condition treated. Your temperature, blood pressure, and pulse will be monitored before, during, and after the transfusion. After the transfusion, your blood may be tested to see how your body has responded. This information is not intended to replace advice given to you by your health care provider. Make sure you discuss any questions you have with your health care provider. Document Revised: 06/23/2021 Document Reviewed: 06/23/2021 Elsevier Patient Education  2024 Elsevier Inc.  

## 2022-11-18 ENCOUNTER — Other Ambulatory Visit: Payer: Self-pay | Admitting: Hematology

## 2022-11-18 ENCOUNTER — Ambulatory Visit (HOSPITAL_COMMUNITY)
Admission: RE | Admit: 2022-11-18 | Discharge: 2022-11-18 | Disposition: A | Discharge status: Home / Self Care | Payer: 59 | Source: Ambulatory Visit | Attending: Hematology | Admitting: Hematology

## 2022-11-18 ENCOUNTER — Ambulatory Visit (HOSPITAL_BASED_OUTPATIENT_CLINIC_OR_DEPARTMENT_OTHER)
Admission: RE | Admit: 2022-11-18 | Discharge: 2022-11-18 | Disposition: A | Discharge status: Home / Self Care | Payer: 59 | Source: Ambulatory Visit | Attending: Hematology | Admitting: Hematology

## 2022-11-18 ENCOUNTER — Encounter (HOSPITAL_COMMUNITY): Payer: Self-pay

## 2022-11-18 DIAGNOSIS — K6389 Other specified diseases of intestine: Secondary | ICD-10-CM

## 2022-11-18 DIAGNOSIS — D500 Iron deficiency anemia secondary to blood loss (chronic): Secondary | ICD-10-CM | POA: Insufficient documentation

## 2022-11-18 MED ORDER — GADOBUTROL 1 MMOL/ML IV SOLN
10.0000 mL | Freq: Once | INTRAVENOUS | Status: AC | PRN
  Administered 2022-11-18: 10 mL via INTRAVENOUS

## 2022-11-20 ENCOUNTER — Other Ambulatory Visit: Payer: Self-pay

## 2022-11-20 ENCOUNTER — Telehealth: Payer: Self-pay | Admitting: Hematology

## 2022-11-20 DIAGNOSIS — C19 Malignant neoplasm of rectosigmoid junction: Secondary | ICD-10-CM

## 2022-11-20 NOTE — Progress Notes (Signed)
NGS testing ordered on Pathology Number: [id number], rectosigmoid biopsy specimen via [facility] on line portal.Order [id number].  [facility] uses [employer]. Per Lenis Dickinson in [place] pathology department request for specimen should go through [facility] pathology.

## 2022-11-20 NOTE — Telephone Encounter (Signed)
Contacted patient to scheduled appointments. Left message with appointment details and a call back number if patient had any questions or could not accommodate the time we provided.   

## 2022-11-21 ENCOUNTER — Inpatient Hospital Stay (HOSPITAL_BASED_OUTPATIENT_CLINIC_OR_DEPARTMENT_OTHER): Payer: 59 | Admitting: Hematology

## 2022-11-21 ENCOUNTER — Encounter: Payer: Self-pay | Admitting: Hematology

## 2022-11-21 DIAGNOSIS — E11628 Type 2 diabetes mellitus with other skin complications: Secondary | ICD-10-CM

## 2022-11-21 DIAGNOSIS — D500 Iron deficiency anemia secondary to blood loss (chronic): Secondary | ICD-10-CM | POA: Diagnosis not present

## 2022-11-21 DIAGNOSIS — C186 Malignant neoplasm of descending colon: Secondary | ICD-10-CM | POA: Insufficient documentation

## 2022-11-21 NOTE — Progress Notes (Signed)
[facility]   Telephone:(336) [phone] Fax:(336) [phone]   Clinic Follow up Note   Patient Care Team: Early, Sung Amabile, NP as PCP - General (Nurse Practitioner) Malachy Mood, MD as Consulting Physician (Hematology and Oncology) Romie Levee, MD as Consulting Physician (General Surgery) Lynann Bologna, DO as Consulting Physician (Gastroenterology)  Date of Service:  11/21/2022  I connected with Tammy Marks on 11/21/2022 at  2:00 PM EDT by telephone visit and verified that I am speaking with the correct person using two identifiers.  I discussed the limitations, risks, security and privacy concerns of performing an evaluation and management service by telephone and the availability of in person appointments. I also discussed with the patient that there may be a patient responsible charge related to this service. The patient expressed understanding and agreed to proceed.   Other persons participating in the visit and their role in the encounter:  No  Patient's location:  Car Provider's location:  [facility] Office  CHIEF COMPLAINT: f/u of Colon Cancer  CURRENT THERAPY:  Work up Pending  ASSESSMENT & PLAN:  Tammy Marks is a 57 y.o. 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  The left descending colon cancer was only 4 mm, removed by polypectomy. The largest 3 cm, fungating mass in the rectosigmoid junction is 15 cm from anal verge on colonoscopy, biopsy confirmed moderate differentiated adenocarcinoma -Her liver MRI unfortunately showed a 1.2 cm mass in the posterior dome of liver, segment 7, concerning for metastasis.  I personally reviewed the image and discussed findings with patient, I will refer her to IR to see if they can biopsy the lesion. -Given this is likely metastatic disease, I recommend chemotherapy first.  We discussed the option of FOLFOX and FOLFIRI, or FOLFIRINOX given her young age, for 3 to 6 months, then  consider surgical resection of both her primary colon cancer and liver metastasis, if she has good response to chemotherapy. --Chemotherapy consent: Side effects including but does not not limited to, fatigue, nausea, vomiting, diarrhea, hair loss, neuropathy, fluid retention, renal and kidney dysfunction, neutropenic fever, needed for blood transfusion, bleeding, were discussed with patient in great detail. She agrees to proceed. -The goal of chemotherapy is curative -She has been seen by colorectal surgeon Dr. Maisie Fus -Will present her case in GI tumor board  Iron deficiency anemia due to chronic blood loss -Secondary to colon cancer -She received a blood transfusion in July 2024 due to hemoglobin 6.3 -Her iron level is very low, I recommend IV iron Venofer, benefit and side effects, especially allergy reaction including anaphylactic reactions, were discussed with her in detail, she agrees.  Diabetes mellitus (HCC) -Not very well-controlled, recent A1c 9.8, I encouraged her to follow-up with primary care physician for medication adjustment. -Dr. Maisie Fus would like her A1c come down to below 8 before surgery.  PLAN: - I recommend IV Iron  Venofer 300 mg x3 due to being anemic - I reviewed CT scan w/ pt -I recommend Liver biopsy and port placement by IR. -discuss chemo regiment, FOLFOX versus CapeOx, patient will think about it and call us back with her decision - arrange chemo class   SUMMARY OF ONCOLOGIC HISTORY: Oncology History   No history exists.     INTERVAL HISTORY:  Tammy Marks was contacted for a follow up of Colon Cancer. She was last seen by me on 11/06/2022.  Pt state that she felt better after  a blood transfusion   All other systems were reviewed with the patient and are negative.  MEDICAL HISTORY:  Past Medical History:  Diagnosis Date   Diabetes mellitus without complication (HCC)     SURGICAL HISTORY: Past Surgical History:  Procedure Laterality Date    CESAREAN SECTION     CHOLECYSTECTOMY      I have reviewed the social history and family history with the patient and they are unchanged from previous note.  ALLERGIES:  has No Known Allergies.  MEDICATIONS:  Current Outpatient Medications  Medication Sig Dispense Refill   azelastine (OPTIVAR) 0.05 % ophthalmic solution Place 1 drop into both eyes 2 (two) times daily. 6 mL 12   Iron, Ferrous Sulfate, 325 (65 Fe) MG TABS Take 325 mg by mouth daily. 30 tablet 11   nystatin (MYCOSTATIN/NYSTOP) powder Apply 1 application to skin folds 3 (three) times daily as needed 60 g 2   simvastatin (ZOCOR) 20 MG tablet Take 1 tablet (20 mg total) by mouth at bedtime. 90 tablet 3   No current facility-administered medications for this visit.    PHYSICAL EXAMINATION: ECOG PERFORMANCE STATUS: 1 - Symptomatic but completely ambulatory  There were no vitals filed for this visit. Wt Readings from Last 3 Encounters:  11/06/22 279 lb 9.6 oz (126.8 kg)  08/10/22 291 lb 0.1 oz (132 kg)  05/23/22 291 lb (132 kg)     No vitals taken today, Exam not performed today  LABORATORY DATA:  I have reviewed the data as listed    Latest Ref Rng & Units 11/08/2022    4:23 PM 11/06/2022    9:04 AM 05/29/2021    1:18 PM  CBC  WBC 4.0 - 10.5 K/uL 10.3  9.5  7.2   Hemoglobin 12.0 - 15.0 g/dL 6.4  6.3  86.5   Hematocrit 36.0 - 46.0 % 22.8  22.6  37.1   Platelets 150 - 400 K/uL 827  797  490         Latest Ref Rng & Units 11/08/2022    4:23 PM 11/06/2022    9:04 AM 05/29/2021    1:18 PM  CMP  Glucose 70 - 99 mg/dL 784  696  295   BUN 6 - 20 mg/dL 9  9  7    Creatinine 0.44 - 1.00 mg/dL 2.84  1.32  4.40   Sodium 135 - 145 mmol/L 135  137  136   Potassium 3.5 - 5.1 mmol/L 3.7  3.6  4.4   Chloride 98 - 111 mmol/L 100  101  96   CO2 22 - 32 mmol/L 24  24  24    Calcium 8.9 - 10.3 mg/dL 9.0  8.9  9.4   Total Protein 6.5 - 8.1 g/dL 8.2  7.7  7.4   Total Bilirubin 0.3 - 1.2 mg/dL 0.3  0.3  0.3   Alkaline Phos 38 -  126 U/L 87  80  111   AST 15 - 41 U/L 12  11  11    ALT 0 - 44 U/L 9  8  13        RADIOGRAPHIC STUDIES: I have personally reviewed the radiological images as listed and agreed with the findings in the report. No results found.    Orders Placed This Encounter  Procedures   US BIOPSY (LIVER)    Standing Status:   Future    Standing Expiration Date:   11/21/2023    Order Specific Question:   Lab orders requested (DO NOT  place separate lab orders, these will be automatically ordered during procedure specimen collection):    Answer:   Surgical Pathology    Order Specific Question:   Reason for Exam (SYMPTOM  OR DIAGNOSIS REQUIRED)    Answer:   confirm metastatic colon cancer    Order Specific Question:   Preferred location?    Answer:   [facility]   IR IMAGING GUIDED PORT INSERTION    Standing Status:   Future    Standing Expiration Date:   11/21/2023    Order Specific Question:   Reason for Exam (SYMPTOM  OR DIAGNOSIS REQUIRED)    Answer:   CHEMO    Order Specific Question:   Is the patient pregnant?    Answer:   Yes    Order Specific Question:   Preferred Imaging Location?    Answer:   [facility]   All questions were answered. The patient knows to call the clinic with any problems, questions or concerns. No barriers to learning was detected. The total time spent in the appointment was 30 minutes.     Malachy Mood, MD 11/21/2022   Carolin Coy am acting as scribe for Malachy Mood, MD.   I have reviewed the above documentation for accuracy and completeness, and I agree with the above.

## 2022-11-21 NOTE — Assessment & Plan Note (Signed)
--  She has 2 synchronized colorectal cancer.  The left descending colon cancer was only 4 mm, removed by polypectomy. The largest 3 cm, fungating mass in the rectosigmoid junction is 15 cm from anal verge on colonoscopy, biopsy confirmed moderate differentiated adenocarcinoma -Her liver MRI unfortunately showed a 1.2 cm mass in the posterior dome of liver, segment 7, concerning for metastasis.  I personally reviewed the image and discussed findings with patient, I will refer her to IR to see if they can biopsy the lesion. -Given this is likely metastatic disease, I recommend chemotherapy first.  We discussed the option of FOLFOX and FOLFIRI, or FOLFIRINOX given her young age, for 3 to 6 months, then consider surgical resection of both her primary colon cancer and liver metastasis, if she has good response to chemotherapy. --Chemotherapy consent: Side effects including but does not not limited to, fatigue, nausea, vomiting, diarrhea, hair loss, neuropathy, fluid retention, renal and kidney dysfunction, neutropenic fever, needed for blood transfusion, bleeding, were discussed with patient in great detail. She agrees to proceed. -The goal of chemotherapy is curative -She has been seen by colorectal surgeon Dr. Maisie Fus -Will present her case in GI tumor board

## 2022-11-21 NOTE — Assessment & Plan Note (Signed)
-  Secondary to colon cancer -She received a blood transfusion in July 2024 due to hemoglobin 6.3 -Her iron level is very low, I recommend IV iron Venofer, benefit and side effects, especially allergy reaction including anaphylactic reactions, were discussed with her in detail, she agrees.

## 2022-11-21 NOTE — Assessment & Plan Note (Signed)
-  Not very well-controlled, recent A1c 9.8, I encouraged her to follow-up with primary care physician for medication adjustment. -Dr. Maisie Fus would like her A1c come down to below 8 before surgery.

## 2022-11-22 ENCOUNTER — Other Ambulatory Visit: Payer: Self-pay | Admitting: Hematology

## 2022-11-22 ENCOUNTER — Encounter: Payer: Self-pay | Admitting: Hematology

## 2022-11-22 ENCOUNTER — Encounter: Payer: Self-pay | Admitting: General Practice

## 2022-11-22 NOTE — Progress Notes (Signed)
Gilmer Mor, DO  Caroleen Hamman, NT Discussed with Dr. Mosetta Putt.  OK for attempt at US guided liver biopsy.  MR 11/18/22, 33/86 of series 16.  This patient is being referred for port catheter.  She needs the appointments on the same day.  I think there is low chance of seeing this lesion with [place], and discussed that with Dr. Mosetta Putt. If we can identify, we can potentially try biopsy.  If not, she has a treatment plan in place.  [address]oreta [address]       Previous Messages    ----- Message ----- From: Caroleen Hamman, NT Sent: 11/22/2022   9:40 AM EDT To: Ir Procedure Requests Subject: US BIOPSY (LIVER)                              Procedure: US BIOPSY (LIVER)  Reason: confirm metastatic colon cancer Dx: Cancer of left colon  History: MR and CT in chart  Provider: Malachy Mood, MD  Contact: [phone]

## 2022-11-24 ENCOUNTER — Ambulatory Visit (HOSPITAL_COMMUNITY)
Admission: RE | Admit: 2022-11-24 | Discharge: 2022-11-24 | Disposition: A | Discharge status: Home / Self Care | Payer: 59 | Source: Ambulatory Visit | Attending: Hematology | Admitting: Hematology

## 2022-11-24 DIAGNOSIS — K6389 Other specified diseases of intestine: Secondary | ICD-10-CM | POA: Insufficient documentation

## 2022-11-28 ENCOUNTER — Encounter: Payer: Self-pay | Admitting: Hematology

## 2022-11-29 ENCOUNTER — Other Ambulatory Visit (HOSPITAL_COMMUNITY): Payer: Self-pay | Admitting: Student

## 2022-11-29 DIAGNOSIS — C186 Malignant neoplasm of descending colon: Secondary | ICD-10-CM

## 2022-12-03 ENCOUNTER — Ambulatory Visit (HOSPITAL_COMMUNITY): Admission: RE | Admit: 2022-12-03 | Payer: 59 | Source: Ambulatory Visit

## 2022-12-03 ENCOUNTER — Encounter (HOSPITAL_COMMUNITY): Payer: Self-pay

## 2022-12-03 ENCOUNTER — Other Ambulatory Visit: Payer: Self-pay

## 2022-12-03 ENCOUNTER — Ambulatory Visit (HOSPITAL_COMMUNITY)
Admission: RE | Admit: 2022-12-03 | Discharge: 2022-12-03 | Disposition: A | Discharge status: Home / Self Care | Payer: 59 | Source: Ambulatory Visit | Attending: Hematology | Admitting: Hematology

## 2022-12-03 ENCOUNTER — Other Ambulatory Visit: Payer: Self-pay | Admitting: Hematology

## 2022-12-03 DIAGNOSIS — C186 Malignant neoplasm of descending colon: Secondary | ICD-10-CM | POA: Diagnosis present

## 2022-12-03 DIAGNOSIS — C787 Secondary malignant neoplasm of liver and intrahepatic bile duct: Secondary | ICD-10-CM | POA: Insufficient documentation

## 2022-12-03 DIAGNOSIS — C19 Malignant neoplasm of rectosigmoid junction: Secondary | ICD-10-CM | POA: Insufficient documentation

## 2022-12-03 HISTORY — PX: IR IMAGING GUIDED PORT INSERTION: IMG5740

## 2022-12-03 HISTORY — PX: IR US LIVER BIOPSY: IMG936

## 2022-12-03 LAB — CBC
HCT: 29 % — ABNORMAL LOW (ref 36.0–46.0)
Hemoglobin: 8.4 g/dL — ABNORMAL LOW (ref 12.0–15.0)
MCH: 18.8 pg — ABNORMAL LOW (ref 26.0–34.0)
MCHC: 29 g/dL — ABNORMAL LOW (ref 30.0–36.0)
MCV: 65 fL — ABNORMAL LOW (ref 80.0–100.0)
Platelets: 632 10*3/uL — ABNORMAL HIGH (ref 150–400)
RBC: 4.46 MIL/uL (ref 3.87–5.11)
RDW: 26.4 % — ABNORMAL HIGH (ref 11.5–15.5)
WBC: 8.5 10*3/uL (ref 4.0–10.5)
nRBC: 0 % (ref 0.0–0.2)

## 2022-12-03 LAB — GLUCOSE, CAPILLARY: Glucose-Capillary: 153 mg/dL — ABNORMAL HIGH (ref 70–99)

## 2022-12-03 LAB — PROTIME-INR
INR: 1.1 (ref 0.8–1.2)
Prothrombin Time: 13.9 seconds (ref 11.4–15.2)

## 2022-12-03 MED ORDER — MIDAZOLAM HCL 2 MG/2ML IJ SOLN
INTRAMUSCULAR | Status: AC
  Filled 2022-12-03: qty 4

## 2022-12-03 MED ORDER — LIDOCAINE-EPINEPHRINE 1 %-1:100000 IJ SOLN
20.0000 mL | Freq: Once | INTRAMUSCULAR | Status: AC
  Administered 2022-12-03: 15 mL via INTRADERMAL

## 2022-12-03 MED ORDER — LIDOCAINE-EPINEPHRINE 1 %-1:100000 IJ SOLN
INTRAMUSCULAR | Status: AC
  Filled 2022-12-03: qty 2

## 2022-12-03 MED ORDER — SODIUM CHLORIDE 0.9 % IV SOLN: INTRAVENOUS | Status: DC

## 2022-12-03 MED ORDER — HEPARIN SOD (PORK) LOCK FLUSH 100 UNIT/ML IV SOLN
INTRAVENOUS | Status: AC
  Filled 2022-12-03: qty 5

## 2022-12-03 MED ORDER — GELATIN ABSORBABLE 12-7 MM EX MISC
CUTANEOUS | Status: AC
  Filled 2022-12-03: qty 1

## 2022-12-03 MED ORDER — FENTANYL CITRATE (PF) 100 MCG/2ML IJ SOLN
INTRAMUSCULAR | Status: AC
  Filled 2022-12-03: qty 4

## 2022-12-03 MED ORDER — FENTANYL CITRATE (PF) 100 MCG/2ML IJ SOLN
INTRAMUSCULAR | Status: AC | PRN
Start: 2022-12-03 — End: 2022-12-03
  Administered 2022-12-03: 25 ug via INTRAVENOUS
  Administered 2022-12-03 (×3): 50 ug via INTRAVENOUS

## 2022-12-03 MED ORDER — MIDAZOLAM HCL 2 MG/2ML IJ SOLN
INTRAMUSCULAR | Status: AC | PRN
Start: 2022-12-03 — End: 2022-12-03
  Administered 2022-12-03: .5 mg via INTRAVENOUS
  Administered 2022-12-03 (×2): 1 mg via INTRAVENOUS

## 2022-12-03 MED ORDER — HEPARIN SOD (PORK) LOCK FLUSH 100 UNIT/ML IV SOLN
500.0000 [IU] | Freq: Once | INTRAVENOUS | Status: AC
  Administered 2022-12-03: 500 [IU] via INTRAVENOUS

## 2022-12-03 NOTE — H&P (Signed)
Chief Complaint: Colon cancer w/ liver lesion  Referring Provider(s): Mosetta Putt  Supervising Physician: Gilmer Mor  Patient Status: [facility] - Out-pt  History of Present Illness: Tammy Marks is a 57 y.o. female with diagnosis of colorectal cancer.  She initially presented with mild hematochezia.  She had a screening colonoscopy which revealed a 3 cm fungating mass in the rectosigmoid junction.  Biopsy confirmed moderate differentiated adenocarcinoma  MRI abdomen done 11/18/22 showed= Diffusion restricting, hypoenhancing lesion of the posterior liver dome, hepatic segment VII, measuring 1.2 x 1.2 cm, most consistent with a hepatic metastasis in the setting of known rectal malignancy.  She is here today for biopsy of the lesion and for placement of a tunneled catheter with port so she can begin chemotherapy.  She is NPO.No nausea/vomiting. No Fever/chills. ROS negative.  Patient is Full Code  Past Medical History:  Diagnosis Date   Diabetes mellitus without complication (HCC)     Past Surgical History:  Procedure Laterality Date   CESAREAN SECTION     CHOLECYSTECTOMY      Allergies: Patient has no known allergies.  Medications: Prior to Admission medications   Medication Sig Start Date End Date Taking? Authorizing Provider  azelastine (OPTIVAR) 0.05 % ophthalmic solution Place 1 drop into both eyes 2 (two) times daily. 05/29/21   Tollie Eth, NP  Iron, Ferrous Sulfate, 325 (65 Fe) MG TABS Take 325 mg by mouth daily. 02/13/19   Grayce Sessions, NP  nystatin (MYCOSTATIN/NYSTOP) powder Apply 1 application to skin folds 3 (three) times daily as needed 05/29/21   Early, Sung Amabile, NP  simvastatin (ZOCOR) 20 MG tablet Take 1 tablet (20 mg total) by mouth at bedtime. 05/29/21 08/27/21  Tollie Eth, NP     Family History  Problem Relation Age of Onset   Cancer Father 45       gastric cancer    Social History   Socioeconomic History   Marital status: Single     Spouse name: Not on file   Number of children: 2   Years of education: Not on file   Highest education level: Not on file  Occupational History   Not on file  Tobacco Use   Smoking status: Never   Smokeless tobacco: Never  Substance and Sexual Activity   Alcohol use: Not Currently   Drug use: Never   Sexual activity: Not Currently  Other Topics Concern   Not on file  Social History Narrative   Not on file   Social Determinants of Health   Financial Resource Strain: Not on file  Food Insecurity: Not on file  Transportation Needs: Not on file  Physical Activity: Not on file  Stress: Not on file  Social Connections: Not on file     Review of Systems: A 12 point ROS discussed and pertinent positives are indicated in the HPI above.  All other systems are negative.   Vital Signs: BP 137/83   Pulse 85   Temp (!) 97.5 F (36.4 C) (Temporal)   Resp 16   Ht 5\' 5"  (1.651 m)   Wt 275 lb (124.7 kg)   LMP 06/20/2020 (Exact Date)   SpO2 99%   BMI 45.76 kg/m   Advance Care Plan: The advanced care place/surrogate decision maker was discussed at the time of visit and the patient did not wish to discuss or was not able to name a surrogate decision maker or provide an advance care plan.  Physical Exam Vitals  reviewed.  Constitutional:      Appearance: Normal appearance.  HENT:     Head: Normocephalic and atraumatic.  Eyes:     Extraocular Movements: Extraocular movements intact.  Cardiovascular:     Rate and Rhythm: Normal rate and regular rhythm.  Pulmonary:     Effort: Pulmonary effort is normal. No respiratory distress.     Breath sounds: Normal breath sounds.  Abdominal:     Palpations: Abdomen is soft.  Musculoskeletal:        General: Normal range of motion.     Cervical back: Normal range of motion.  Skin:    General: Skin is warm and dry.  Neurological:     General: No focal deficit present.     Mental Status: She is alert and oriented to person, place, and  time.  Psychiatric:        Mood and Affect: Mood normal.        Behavior: Behavior normal.        Thought Content: Thought content normal.        Judgment: Judgment normal.     Imaging: MR PELVIS WO CM RECTAL CA STAGING  Result Date: 12/01/2022 CLINICAL DATA:  Rectal mass. EXAM: MRI PELVIS WITHOUT CONTRAST TECHNIQUE: Multiplanar multisequence MR imaging of the pelvis was performed. No intravenous contrast was administered. Ultrasound gel was administered per rectum to optimize tumor evaluation. COMPARISON:  CT 11/01/2022. FINDINGS: TUMOR LOCATION Tumor distance from Anal Verge/Skin surface: 15.3 cm Tumor distance to Internal Anal sphincter: 11.1 TUMOR DESCRIPTION Circumferential extent: Circumferential, apple-core like lesion. Tumor Size and volume: Extends for a longitudinal length along the bowel of a proximally 3.9 cm maximally. T - CATEGORY Extension through Muscularis Propria: Yes less than 5 mm anteriorly. However this is above the peritoneal reflection. The lesion does abut the posterior wall of the cervix with loss of tissue plane. Possibly T4b Shortest Distance of any tumor/node from Mesorectal fascia: No involvement of the mesorectal fascia. Extramural Vascular Invasion/Tumor Thrombus: No Invasion of Anterior Peritoneal Reflection: Lesion is above the peritoneal reflection and extends through the wall. Involvement of Adjacent Organs or Pelvic Sidewall: Lesion abuts the posterior aspect of the cervix on sagittal series 2, image 19. Possible involvement Levator Ani Involvement: No N - CATEGORY Mesorectal Lymph Nodes >=42mm: > 4, N2 Extra-mesorectal Lymphadenopathy: Yes. Examples include left, series 3, image 7 in the upper pelvis measuring 13 x 12 mm, image 5 of series 3 left of midline measuring 10 by 9 mm. Several other suspicious nodes. Other: No free fluid in the pelvis. IMPRESSION: Rectal adenocarcinoma T stage: T4b. Of note the lesion abuts the posterior aspect of the cervix but no clear  invasion of the cervix or signal changes. Lesion is centered above the peritoneal reflection, high lesion. Rectal adenocarcinoma N stage:  N2 Distance from tumor to the internal anal sphincter is 11 cm. Electronically Signed   By: Karen Kays M.D.   On: 12/01/2022 20:39   CT Chest Wo Contrast  Result Date: 11/22/2022 CLINICAL DATA:  Staging with new diagnosis of colon carcinoma. * Tracking Code: BO * EXAM: CT CHEST WITHOUT CONTRAST TECHNIQUE: Multidetector CT imaging of the chest was performed following the standard protocol without IV contrast. RADIATION DOSE REDUCTION: This exam was performed according to the departmental dose-optimization program which includes automated exposure control, adjustment of the mA and/or kV according to patient size and/or use of iterative reconstruction technique. COMPARISON:  None Available. FINDINGS: Cardiovascular: Normal aortic caliber. Tortuous thoracic aorta. Mild  cardiomegaly, without pericardial effusion. Lad coronary artery calcification on 72/2. Subtle left circumflex calcification on 73/2. Mediastinum/Nodes: Upper normal low left jugular/supraclavicular node at 8 mm on 14/2. Mildly prominent bilateral axillary and subpectoral nodes. An index left axillary node measures 8 mm on 29/2. No mediastinal or hilar adenopathy, given limitations of unenhanced CT. Lungs/Pleura: No pleural fluid.  Clear lungs. Upper Abdomen: Deferred to dedicated abdominopelvic CT. Cholecystectomy. Probable hepatomegaly. Normal imaged portions of the spleen, stomach, pancreas, adrenal glands, kidneys. Musculoskeletal: No acute osseous abnormality. IMPRESSION: 1. No evidence of pulmonary metastasis. 2. Borderline to mild adenopathy within both axillas, subpectoral regions, and low left neck. Favored to be reactive. Not a typical pattern for colon cancer metastasis. Synchronous lymphoproliferative process cannot be entirely excluded. Consider chest CT follow-up at 3-6 months. 3. Age advanced  coronary artery atherosclerosis. Recommend assessment of coronary risk factors. Electronically Signed   By: Jeronimo Greaves M.D.   On: 11/22/2022 12:35   MR Abdomen W Wo Contrast  Result Date: 11/18/2022 CLINICAL DATA:  Colon mass, abdominal EXAM: MRI ABDOMEN WITHOUT AND WITH CONTRAST TECHNIQUE: Multiplanar multisequence MR imaging of the abdomen was performed both before and after the administration of intravenous contrast. CONTRAST:  10mL GADAVIST GADOBUTROL 1 MMOL/ML IV SOLN COMPARISON:  CT abdomen pelvis, 11/01/2022 FINDINGS: Lower chest: No acute abnormality. Hepatobiliary: Hepatomegaly, maximum coronal span 20.2 cm. Diffusion restricting, hypoenhancing lesion of the posterior liver dome, hepatic segment VII, measuring 1.2 x 1.2 cm (series 16, image 34). Status post cholecystectomy. No biliary dilatation. Pancreas: Unremarkable. No pancreatic ductal dilatation or surrounding inflammatory changes. Spleen: Normal in size without significant abnormality. Adrenals/Urinary Tract: Adrenal glands are unremarkable. Kidneys are normal, without renal calculi, solid lesion, or hydronephrosis. Stomach/Bowel: Stomach is within normal limits. No evidence of bowel wall thickening, distention, or inflammatory changes. Vascular/Lymphatic: No significant vascular findings are present. No enlarged abdominal lymph nodes. Other: No abdominal wall hernia or abnormality. No ascites. Musculoskeletal: No acute or significant osseous findings. IMPRESSION: 1. Diffusion restricting, hypoenhancing lesion of the posterior liver dome, hepatic segment VII, measuring 1.2 x 1.2 cm, most consistent with a hepatic metastasis in the setting of known rectal malignancy. 2. No other evidence of lymphadenopathy or metastatic disease in the abdomen. 3. Hepatomegaly. 4. Status post cholecystectomy. Electronically Signed   By: Jearld Lesch M.D.   On: 11/18/2022 22:18    Labs:  CBC: Recent Labs    11/06/22 0904 11/08/22 1623  WBC 9.5 10.3  HGB  6.3* 6.4*  HCT 22.6* 22.8*  PLT 797* 827*    COAGS: No results for input(s): "INR", "APTT" in the last 8760 hours.  BMP: Recent Labs    11/06/22 0904 11/08/22 1623  NA 137 135  K 3.6 3.7  CL 101 100  CO2 24 24  GLUCOSE 240* 155*  BUN 9 9  CALCIUM 8.9 9.0  CREATININE 0.74 0.70  GFRNONAA >60 >60    LIVER FUNCTION TESTS: Recent Labs    11/06/22 0904 11/08/22 1623  BILITOT 0.3 0.3  AST 11* 12*  ALT 8 9  ALKPHOS 80 87  PROT 7.7 8.2*  ALBUMIN 3.7 4.1    TUMOR MARKERS: Recent Labs    11/06/22 0904  CEA 27.14*    Assessment and Plan:   Moderate differentiated adenocarcinoma with hypoenhancing lesion of the posterior liver dome, hepatic segment VII, measuring 1.2 x 1.2 cm, most consistent with a hepatic metastasis.  Will proceed with image guided biopsy of the lesion and placement of a Port A Cath today  by Dr. Loreta Ave.  Risks and benefits of liver lesion biopsy was discussed with the patient and/or patient's family including, but not limited to bleeding, infection, damage to adjacent structures or low yield requiring additional tests.  Risks and benefits of image guided port-a-catheter placement was discussed with the patient including, but not limited to bleeding, infection, pneumothorax, or fibrin sheath development and need for additional procedures.  All of the questions were answered and there is agreement to proceed.  Consent signed and in chart.  Thank you for allowing our service to participate in MICHOLE SALVI 's care.  Electronically Signed: Gwynneth Macleod, PA-C   12/03/2022, 7:30 AM      I spent a total of  30 Minutes in face to face in clinical consultation, greater than 50% of which was counseling/coordinating care for liver lesion biopsy.

## 2022-12-03 NOTE — Procedures (Signed)
 Interventional Radiology Procedure Note  Procedure: Placement of a right IJ approach single lumen PowerPort.  Tip is positioned at the superior cavoatrial junction and catheter is ready for immediate use.  Complications: None Recommendations:  - Ok to shower tomorrow - Do not submerge for 7 days - Routine line care  Signed,  Yvone Neu. Loreta Ave, DO, ABVM, RPVI

## 2022-12-03 NOTE — Progress Notes (Signed)
Patient walked to the bathroom no difficulties. Right chest and right side dressing clean, dry and intact.

## 2022-12-03 NOTE — Procedures (Signed)
Interventional Radiology Procedure Note  Procedure: US guided biopsy of liver mass.   Complications: None  EBL: None  Recommendations: - Bedrest 2 hours.   - Routine wound care - Follow up pathology - Advance diet  - suspect colorectal mets  Signed,  Yvone Neu. Loreta Ave, DO, ABVM, RPVI

## 2022-12-04 NOTE — Progress Notes (Signed)
I spoke with Tammy Marks.  She had her port placed yesterday and is ready to move forward with FOLFOX.

## 2022-12-05 ENCOUNTER — Telehealth: Payer: Self-pay | Admitting: Hematology

## 2022-12-05 LAB — SURGICAL PATHOLOGY

## 2022-12-11 ENCOUNTER — Encounter (HOSPITAL_COMMUNITY): Payer: Self-pay | Admitting: Hematology

## 2022-12-12 ENCOUNTER — Inpatient Hospital Stay: Payer: 59

## 2022-12-12 ENCOUNTER — Encounter: Payer: Self-pay | Admitting: Hematology

## 2022-12-12 ENCOUNTER — Inpatient Hospital Stay: Payer: 59 | Attending: Hematology | Admitting: Hematology

## 2022-12-12 VITALS — BP 132/86 | HR 104 | Temp 98.6°F | Resp 16 | Ht 65.0 in | Wt 270.5 lb

## 2022-12-12 DIAGNOSIS — C787 Secondary malignant neoplasm of liver and intrahepatic bile duct: Secondary | ICD-10-CM | POA: Diagnosis not present

## 2022-12-12 DIAGNOSIS — D500 Iron deficiency anemia secondary to blood loss (chronic): Secondary | ICD-10-CM

## 2022-12-12 DIAGNOSIS — C186 Malignant neoplasm of descending colon: Secondary | ICD-10-CM | POA: Diagnosis not present

## 2022-12-12 DIAGNOSIS — D649 Anemia, unspecified: Secondary | ICD-10-CM

## 2022-12-12 DIAGNOSIS — K6389 Other specified diseases of intestine: Secondary | ICD-10-CM

## 2022-12-12 DIAGNOSIS — C19 Malignant neoplasm of rectosigmoid junction: Secondary | ICD-10-CM

## 2022-12-12 DIAGNOSIS — Z5111 Encounter for antineoplastic chemotherapy: Secondary | ICD-10-CM | POA: Insufficient documentation

## 2022-12-12 LAB — CBC WITH DIFFERENTIAL (CANCER CENTER ONLY)
Abs Immature Granulocytes: 0.06 10*3/uL (ref 0.00–0.07)
Basophils Absolute: 0.1 10*3/uL (ref 0.0–0.1)
Basophils Relative: 1 %
Eosinophils Absolute: 0.1 10*3/uL (ref 0.0–0.5)
Eosinophils Relative: 1 %
HCT: 28.9 % — ABNORMAL LOW (ref 36.0–46.0)
Hemoglobin: 8.9 g/dL — ABNORMAL LOW (ref 12.0–15.0)
Immature Granulocytes: 1 %
Lymphocytes Relative: 23 %
Lymphs Abs: 2.8 10*3/uL (ref 0.7–4.0)
MCH: 19.9 pg — ABNORMAL LOW (ref 26.0–34.0)
MCHC: 30.8 g/dL (ref 30.0–36.0)
MCV: 64.7 fL — ABNORMAL LOW (ref 80.0–100.0)
Monocytes Absolute: 0.8 10*3/uL (ref 0.1–1.0)
Monocytes Relative: 7 %
Neutro Abs: 8.2 10*3/uL — ABNORMAL HIGH (ref 1.7–7.7)
Neutrophils Relative %: 67 %
Platelet Count: 567 10*3/uL — ABNORMAL HIGH (ref 150–400)
RBC: 4.47 MIL/uL (ref 3.87–5.11)
RDW: 25.5 % — ABNORMAL HIGH (ref 11.5–15.5)
WBC Count: 12 10*3/uL — ABNORMAL HIGH (ref 4.0–10.5)
nRBC: 0 % (ref 0.0–0.2)

## 2022-12-12 LAB — CMP (CANCER CENTER ONLY)
ALT: 13 U/L (ref 0–44)
AST: 12 U/L — ABNORMAL LOW (ref 15–41)
Albumin: 4 g/dL (ref 3.5–5.0)
Alkaline Phosphatase: 83 U/L (ref 38–126)
Anion gap: 9 (ref 5–15)
BUN: 16 mg/dL (ref 6–20)
CO2: 28 mmol/L (ref 22–32)
Calcium: 9.8 mg/dL (ref 8.9–10.3)
Chloride: 98 mmol/L (ref 98–111)
Creatinine: 1 mg/dL (ref 0.44–1.00)
GFR, Estimated: >60 mL/min (ref >60)
Glucose, Bld: 163 mg/dL — ABNORMAL HIGH (ref 70–99)
Potassium: 3.5 mmol/L (ref 3.5–5.1)
Sodium: 135 mmol/L (ref 135–145)
Total Bilirubin: 0.4 mg/dL (ref 0.3–1.2)
Total Protein: 8.2 g/dL — ABNORMAL HIGH (ref 6.5–8.1)

## 2022-12-12 LAB — MISCELLANEOUS TEST

## 2022-12-12 MED ORDER — ONDANSETRON HCL 8 MG PO TABS: 8.0000 mg | ORAL_TABLET | Freq: Three times a day (TID) | ORAL | 1 refills | Status: DC | PRN

## 2022-12-12 MED ORDER — PROCHLORPERAZINE MALEATE 10 MG PO TABS: 10.0000 mg | ORAL_TABLET | Freq: Four times a day (QID) | ORAL | 1 refills | Status: DC | PRN

## 2022-12-12 MED ORDER — LIDOCAINE-PRILOCAINE 2.5-2.5 % EX CREA
TOPICAL_CREAM | CUTANEOUS | 3 refills | Status: DC
Start: 2022-12-12 — End: 2022-12-20

## 2022-12-12 MED ORDER — DEXAMETHASONE 4 MG PO TABS: 8.0000 mg | ORAL_TABLET | Freq: Every day | ORAL | 1 refills | Status: DC

## 2022-12-12 MED ORDER — SODIUM CHLORIDE 0.9% FLUSH
10.0000 mL | Freq: Once | INTRAVENOUS | Status: AC | PRN
  Administered 2022-12-12: 10 mL

## 2022-12-12 MED ORDER — HEPARIN SOD (PORK) LOCK FLUSH 100 UNIT/ML IV SOLN
500.0000 [IU] | Freq: Once | INTRAVENOUS | Status: AC | PRN
  Administered 2022-12-12: 500 [IU]

## 2022-12-12 NOTE — Progress Notes (Signed)
[facility]   Telephone:([phone]) [phone] [phone]) [phone]   Clinic Follow up Note   Patient Care Team: Early, Sung Amabile, NP as PCP - General (Nurse Practitioner) Malachy Mood, MD as Consulting Physician (Hematology and Oncology) Romie Levee, MD as Consulting Physician (General Surgery) Lynann Bologna, DO as Consulting Physician (Gastroenterology)  Date of Service:  12/15/2022  CHIEF COMPLAINT: f/u of Colon Cancer   CURRENT THERAPY:  Venofer 300 mg FOLFOX Q14D ASSESSMENT:  Tammy Marks is a 57 y.o. female with   Iron deficiency anemia due to chronic blood loss -Secondary to colon cancer -She received a blood transfusion in July 2024 due to hemoglobin 6.3 -Her iron level is very low, I recommend IV iron Venofer, benefit and side effects, especially allergy reaction including anaphylactic reactions, were discussed with her in detail, she agrees.  Cancer of left colon (HCC) --cTxN0M1, stage IV with oligo liver metastasis, MMR normal -She has 2 synchronized colorectal cancer.  The left descending colon cancer was only 4 mm, removed by polypectomy. The largest 3 cm, fungating mass in the rectosigmoid junction is 15 cm from anal verge on colonoscopy, biopsy confirmed moderate differentiated adenocarcinoma -I reviewed her staging CT chest from November 18, 2022, which showed no definitive evidence of metastasis, but showed borderline enlarged thoracic adenopathy.  I will obtain a PET scan for further evaluation. -Her liver MRI unfortunately showed a 1.2 cm mass in the posterior dome of liver, segment 7, biopsy on 12/03/22 confirmed metastasis from colon cancer, I reviewed with her.  -Given her metastatic disease, I recommend chemotherapy first.  We discussed the option of FOLFOX and FOLFIRI, or FOLFIRINOX given her young age, for 3 to 6 months, then consider surgical resection of both her primary colon cancer and liver metastasis, if she has good response to chemotherapy. She  lives alone and wish to continue working, I recommend FOLFOX  --Chemotherapy consent: Side effects including but does not not limited to, fatigue, nausea, vomiting, diarrhea, hair loss, neuropathy, fluid retention, renal and kidney dysfunction, neutropenic fever, needed for blood transfusion, bleeding, were discussed with patient in great detail. She agrees to proceed. -The goal of chemotherapy is curative -She has been seen by colorectal surgeon Dr. Maisie Fus -Will proceed with chemotherapy first, and then reevaluated after 3 months of chemo, and discuss surgical resection of the primary tumor and liver metastasis.    PLAN: - Pt agree to chemo -FOLFOX - I recommend 3 IV iron infusion - I request chemo education -lab reviewed -CMP -pending - I prescribe antinausea   - I reviewed CT Chest w/ pt -I will order a PET -I will reach out to [facility] or [facility] -plan to start chemo next week   SUMMARY OF ONCOLOGIC HISTORY: Oncology History  Cancer of left colon (HCC)  11/21/2022 Initial Diagnosis   Cancer of left colon (HCC)   12/19/2022 -  Chemotherapy   Patient is on Treatment Plan : COLORECTAL FOLFOX q14d        INTERVAL HISTORY:  Tammy Marks is here for a follow up of Colon Cancer. She was last seen by me on 11/21/2022. She presents to the clinic alone. Pt state that she has a blood transfusion but no IV Iron.   All other systems were reviewed with the patient and are negative.  MEDICAL HISTORY:  Past Medical History:  Diagnosis Date   Diabetes mellitus without complication (HCC)     SURGICAL HISTORY: Past Surgical History:  Procedure Laterality Date  CESAREAN SECTION     CHOLECYSTECTOMY     IR IMAGING GUIDED PORT INSERTION  12/03/2022   IR US LIVER BIOPSY  12/03/2022    I have reviewed the social history and family history with the patient and they are unchanged from previous note.  ALLERGIES:  has No Known Allergies.  MEDICATIONS:  Current Outpatient Medications   Medication Sig Dispense Refill   azelastine (OPTIVAR) 0.05 % ophthalmic solution Place 1 drop into both eyes 2 (two) times daily. 6 mL 12   dexamethasone (DECADRON) 4 MG tablet Take 2 tablets (8 mg total) by mouth daily. Start the day after chemotherapy for 2 days. Take with food. 30 tablet 1   Iron, Ferrous Sulfate, 325 (65 Fe) MG TABS Take 325 mg by mouth daily. 30 tablet 11   lidocaine-prilocaine (EMLA) cream Apply to affected area once 30 g 3   nystatin (MYCOSTATIN/NYSTOP) powder Apply 1 application to skin folds 3 (three) times daily as needed 60 g 2   ondansetron (ZOFRAN) 8 MG tablet Take 1 tablet (8 mg total) by mouth every 8 (eight) hours as needed for nausea or vomiting. Start on the third day after chemotherapy. 30 tablet 1   prochlorperazine (COMPAZINE) 10 MG tablet Take 1 tablet (10 mg total) by mouth every 6 (six) hours as needed for nausea or vomiting. 30 tablet 1   simvastatin (ZOCOR) 20 MG tablet Take 1 tablet (20 mg total) by mouth at bedtime. 90 tablet 3   No current facility-administered medications for this visit.    PHYSICAL EXAMINATION: ECOG PERFORMANCE STATUS: 1 - Symptomatic but completely ambulatory  Vitals:   12/12/22 1506  BP: 132/86  Pulse: (!) 104  Resp: 16  Temp: 98.6 F (37 C)  SpO2: 100%   Wt Readings from Last 3 Encounters:  12/12/22 270 lb 8 oz (122.7 kg)  12/03/22 275 lb (124.7 kg)  11/06/22 279 lb 9.6 oz (126.8 kg)     GENERAL:alert, no distress and comfortable SKIN: skin color normal, no rashes or significant lesions EYES: normal, Conjunctiva are pink and non-injected, sclera clear  NEURO: alert & oriented x 3 with fluent speech  LABORATORY DATA:  I have reviewed the data as listed    Latest Ref Rng & Units 12/12/2022    2:40 PM 12/03/2022    7:01 AM 11/08/2022    4:23 PM  CBC  WBC 4.0 - 10.5 K/uL 12.0  8.5  10.3   Hemoglobin 12.0 - 15.0 g/dL 8.9  8.4  6.4   Hematocrit 36.0 - 46.0 % 28.9  29.0  22.8   Platelets 150 - 400 K/uL 567  632   827         Latest Ref Rng & Units 12/12/2022    2:40 PM 11/08/2022    4:23 PM 11/06/2022    9:04 AM  CMP  Glucose 70 - 99 mg/dL 657  846  962   BUN 6 - 20 mg/dL 16  9  9    Creatinine 0.44 - 1.00 mg/dL 9.52  8.41  3.24   Sodium 135 - 145 mmol/L 135  135  137   Potassium 3.5 - 5.1 mmol/L 3.5  3.7  3.6   Chloride 98 - 111 mmol/L 98  100  101   CO2 22 - 32 mmol/L 28  24  24    Calcium 8.9 - 10.3 mg/dL 9.8  9.0  8.9   Total Protein 6.5 - 8.1 g/dL 8.2  8.2  7.7   Total Bilirubin  0.3 - 1.2 mg/dL 0.4  0.3  0.3   Alkaline Phos 38 - 126 U/L 83  87  80   AST 15 - 41 U/L 12  12  11    ALT 0 - 44 U/L 13  9  8        RADIOGRAPHIC STUDIES: I have personally reviewed the radiological images as listed and agreed with the findings in the report. No results found.    Orders Placed This Encounter  Procedures   Consent Attestation for Oncology Treatment    Order Specific Question:   The patient is informed of risks, benefits, side-effects of the prescribed oncology treatment. Potential short term and long term side effects and response rates discussed. After a long discussion, the patient made informed decision to proceed.    Answer:   Yes   NM PET Image Initial (PI) Skull Base To Thigh    Evaluate thoracic adenopathy    Standing Status:   Future    Standing Expiration Date:   12/12/2023    Order Specific Question:   If indicated for the ordered procedure, I authorize the administration of a radiopharmaceutical per Radiology protocol    Answer:   Yes    Order Specific Question:   Is the patient pregnant?    Answer:   No    Order Specific Question:   Preferred imaging location?    Answer:   [facility]   CBC with Differential (Cancer Center Only)    Standing Status:   Future    Standing Expiration Date:   12/19/2023   CMP (Cancer Center only)    Standing Status:   Future    Standing Expiration Date:   12/19/2023   CBC with Differential (Cancer Center Only)    Standing Status:   Future     Standing Expiration Date:   01/02/2024   CMP (Cancer Center only)    Standing Status:   Future    Standing Expiration Date:   01/02/2024   CBC with Differential (Cancer Center Only)    Standing Status:   Future    Standing Expiration Date:   01/16/2024   CMP (Cancer Center only)    Standing Status:   Future    Standing Expiration Date:   01/16/2024   CBC with Differential (Cancer Center Only)    Standing Status:   Future    Standing Expiration Date:   01/30/2024   CMP (Cancer Center only)    Standing Status:   Future    Standing Expiration Date:   01/30/2024   [facility] PHYSICIAN COMMUNICATION 1    Number of doses of oxaliplatin received at [facility] or outside facility. signature of Provider. If patient has received greater than 5 doses of oxaliplatin, the following pre-medications should be ordered: dexamethasone, diphenhydramine, and formulary histamine H2 antagonist. If patient cannot tolerate oral histamine H2 antagonist, IV may be given.   All questions were answered. The patient knows to call the clinic with any problems, questions or concerns. No barriers to learning was detected. The total time spent in the appointment was 45 minutes.     Malachy Mood, MD 12/15/2022   Carolin Coy, CMA, am acting as scribe for Malachy Mood, MD.   I have reviewed the above documentation for accuracy and completeness, and I agree with the above.

## 2022-12-12 NOTE — Assessment & Plan Note (Addendum)
--  cTxN0M1, stage IV with oligo liver metastasis, MMR normal -She has 2 synchronized colorectal cancer.  The left descending colon cancer was only 4 mm, removed by polypectomy. The largest 3 cm, fungating mass in the rectosigmoid junction is 15 cm from anal verge on colonoscopy, biopsy confirmed moderate differentiated adenocarcinoma -I reviewed her staging CT chest from November 18, 2022, which showed no definitive evidence of metastasis, but showed borderline enlarged thoracic adenopathy.  I will obtain a PET scan for further evaluation. -Her liver MRI unfortunately showed a 1.2 cm mass in the posterior dome of liver, segment 7, biopsy on 12/03/22 confirmed metastasis from colon cancer, I reviewed with her.  -Given her metastatic disease, I recommend chemotherapy first.  We discussed the option of FOLFOX and FOLFIRI, or FOLFIRINOX given her young age, for 3 to 6 months, then consider surgical resection of both her primary colon cancer and liver metastasis, if she has good response to chemotherapy. She lives alone and wish to continue working, I recommend FOLFOX  --Chemotherapy consent: Side effects including but does not not limited to, fatigue, nausea, vomiting, diarrhea, hair loss, neuropathy, fluid retention, renal and kidney dysfunction, neutropenic fever, needed for blood transfusion, bleeding, were discussed with patient in great detail. She agrees to proceed. -The goal of chemotherapy is curative -She has been seen by colorectal surgeon Dr. Maisie Fus -Will proceed with chemotherapy first, and then reevaluated after 3 months of chemo, and discuss surgical resection of the primary tumor and liver metastasis.

## 2022-12-12 NOTE — Assessment & Plan Note (Signed)
-  Secondary to colon cancer -She received a blood transfusion in July 2024 due to hemoglobin 6.3 -Her iron level is very low, I recommend IV iron Venofer, benefit and side effects, especially allergy reaction including anaphylactic reactions, were discussed with her in detail, she agrees.

## 2022-12-13 ENCOUNTER — Other Ambulatory Visit: Payer: Self-pay

## 2022-12-13 LAB — FERRITIN: Ferritin: 18 ng/mL (ref 11–307)

## 2022-12-14 ENCOUNTER — Inpatient Hospital Stay: Payer: 59

## 2022-12-14 ENCOUNTER — Other Ambulatory Visit: Payer: Self-pay

## 2022-12-15 ENCOUNTER — Encounter: Payer: Self-pay | Admitting: Hematology

## 2022-12-18 ENCOUNTER — Other Ambulatory Visit: Payer: Self-pay | Admitting: Hematology

## 2022-12-18 ENCOUNTER — Inpatient Hospital Stay: Payer: 59

## 2022-12-18 ENCOUNTER — Encounter: Payer: Self-pay | Admitting: Hematology

## 2022-12-18 VITALS — BP 132/69 | HR 88 | Temp 98.2°F | Resp 17 | Wt 272.2 lb

## 2022-12-18 DIAGNOSIS — C186 Malignant neoplasm of descending colon: Secondary | ICD-10-CM

## 2022-12-18 DIAGNOSIS — D500 Iron deficiency anemia secondary to blood loss (chronic): Secondary | ICD-10-CM

## 2022-12-18 DIAGNOSIS — Z5111 Encounter for antineoplastic chemotherapy: Secondary | ICD-10-CM | POA: Diagnosis not present

## 2022-12-18 LAB — CBC WITH DIFFERENTIAL (CANCER CENTER ONLY)
Abs Immature Granulocytes: 0.03 10*3/uL (ref 0.00–0.07)
Basophils Absolute: 0.1 10*3/uL (ref 0.0–0.1)
Basophils Relative: 1 %
Eosinophils Absolute: 0.2 10*3/uL (ref 0.0–0.5)
Eosinophils Relative: 2 %
HCT: 28.5 % — ABNORMAL LOW (ref 36.0–46.0)
Hemoglobin: 8.4 g/dL — ABNORMAL LOW (ref 12.0–15.0)
Immature Granulocytes: 0 %
Lymphocytes Relative: 35 %
Lymphs Abs: 2.9 10*3/uL (ref 0.7–4.0)
MCH: 19.5 pg — ABNORMAL LOW (ref 26.0–34.0)
MCHC: 29.5 g/dL — ABNORMAL LOW (ref 30.0–36.0)
MCV: 66.3 fL — ABNORMAL LOW (ref 80.0–100.0)
Monocytes Absolute: 0.6 10*3/uL (ref 0.1–1.0)
Monocytes Relative: 7 %
Neutro Abs: 4.7 10*3/uL (ref 1.7–7.7)
Neutrophils Relative %: 55 %
Platelet Count: 539 10*3/uL — ABNORMAL HIGH (ref 150–400)
RBC: 4.3 MIL/uL (ref 3.87–5.11)
RDW: 25 % — ABNORMAL HIGH (ref 11.5–15.5)
WBC Count: 8.5 10*3/uL (ref 4.0–10.5)
nRBC: 0 % (ref 0.0–0.2)

## 2022-12-18 LAB — CMP (CANCER CENTER ONLY)
ALT: 10 U/L (ref 0–44)
AST: 12 U/L — ABNORMAL LOW (ref 15–41)
Albumin: 3.8 g/dL (ref 3.5–5.0)
Alkaline Phosphatase: 66 U/L (ref 38–126)
Anion gap: 10 (ref 5–15)
BUN: 8 mg/dL (ref 6–20)
CO2: 27 mmol/L (ref 22–32)
Calcium: 9 mg/dL (ref 8.9–10.3)
Chloride: 102 mmol/L (ref 98–111)
Creatinine: 0.76 mg/dL (ref 0.44–1.00)
GFR, Estimated: >60 mL/min (ref >60)
Glucose, Bld: 145 mg/dL — ABNORMAL HIGH (ref 70–99)
Potassium: 3.5 mmol/L (ref 3.5–5.1)
Sodium: 139 mmol/L (ref 135–145)
Total Bilirubin: 0.3 mg/dL (ref 0.3–1.2)
Total Protein: 7.8 g/dL (ref 6.5–8.1)

## 2022-12-18 MED ORDER — LEUCOVORIN CALCIUM INJECTION 350 MG
400.0000 mg/m2 | Freq: Once | INTRAVENOUS | Status: AC
  Administered 2022-12-18: 948 mg via INTRAVENOUS
  Filled 2022-12-18: qty 47.4

## 2022-12-18 MED ORDER — HEPARIN SOD (PORK) LOCK FLUSH 100 UNIT/ML IV SOLN: 500.0000 [IU] | Freq: Once | INTRAVENOUS | Status: DC | PRN

## 2022-12-18 MED ORDER — SODIUM CHLORIDE 0.9% FLUSH: 10.0000 mL | INTRAVENOUS | Status: DC | PRN

## 2022-12-18 MED ORDER — SODIUM CHLORIDE 0.9 % IV SOLN
300.0000 mg | Freq: Once | INTRAVENOUS | Status: AC
  Administered 2022-12-18: 300 mg via INTRAVENOUS
  Filled 2022-12-18: qty 300

## 2022-12-18 MED ORDER — SODIUM CHLORIDE 0.9 % IV SOLN
10.0000 mg | Freq: Once | INTRAVENOUS | Status: AC
  Administered 2022-12-18: 10 mg via INTRAVENOUS
  Filled 2022-12-18: qty 10

## 2022-12-18 MED ORDER — LORATADINE 10 MG PO TABS
10.0000 mg | ORAL_TABLET | Freq: Once | ORAL | Status: AC
  Administered 2022-12-18: 10 mg via ORAL
  Filled 2022-12-18: qty 1

## 2022-12-18 MED ORDER — OXALIPLATIN CHEMO INJECTION 100 MG/20ML
85.0000 mg/m2 | Freq: Once | INTRAVENOUS | Status: AC
  Administered 2022-12-18: 200 mg via INTRAVENOUS
  Filled 2022-12-18: qty 3.71

## 2022-12-18 MED ORDER — FLUOROURACIL CHEMO INJECTION 2.5 GM/50ML
400.0000 mg/m2 | Freq: Once | INTRAVENOUS | Status: AC
  Administered 2022-12-18: 1000 mg via INTRAVENOUS
  Filled 2022-12-18: qty 20

## 2022-12-18 MED ORDER — DEXTROSE 5 % IV SOLN: Freq: Once | INTRAVENOUS | Status: AC

## 2022-12-18 MED ORDER — SODIUM CHLORIDE 0.9% FLUSH
10.0000 mL | Freq: Once | INTRAVENOUS | Status: AC | PRN
  Administered 2022-12-18: 10 mL

## 2022-12-18 MED ORDER — SODIUM CHLORIDE 0.9 % IV SOLN: Freq: Once | INTRAVENOUS | Status: AC

## 2022-12-18 MED ORDER — PALONOSETRON HCL INJECTION 0.25 MG/5ML
0.2500 mg | Freq: Once | INTRAVENOUS | Status: AC
  Administered 2022-12-18: 0.25 mg via INTRAVENOUS
  Filled 2022-12-18: qty 5

## 2022-12-18 MED ORDER — SODIUM CHLORIDE 0.9 % IV SOLN
2400.0000 mg/m2 | INTRAVENOUS | Status: DC
  Administered 2022-12-18: 5700 mg via INTRAVENOUS
  Filled 2022-12-18: qty 114

## 2022-12-18 NOTE — Patient Instructions (Signed)
[place] [facility]  Discharge Instructions: Thank you for choosing [facility] to provide your oncology and hematology care.   If you have a lab appointment with the Cancer Center, please go directly to the Cancer Center and check in at the registration area.   Wear comfortable clothing and clothing appropriate for easy access to any Portacath or PICC line.   We strive to give you quality time with your provider. You may need to reschedule your appointment if you arrive late (15 or more minutes).  Arriving late affects you and other patients whose appointments are after yours.  Also, if you miss three or more appointments without notifying the office, you may be dismissed from the clinic at the provider's discretion.      For prescription refill requests, have your pharmacy contact our office and allow 72 hours for refills to be completed.    Today you received the following chemotherapy and/or immunotherapy agents: Oxaliplatin and Fluorouracil      To help prevent nausea and vomiting after your treatment, we encourage you to take your nausea medication as directed.  BELOW ARE SYMPTOMS THAT SHOULD BE REPORTED IMMEDIATELY: *FEVER GREATER THAN 100.4 F (38 C) OR HIGHER *CHILLS OR SWEATING *NAUSEA AND VOMITING THAT IS NOT CONTROLLED WITH YOUR NAUSEA MEDICATION *UNUSUAL SHORTNESS OF BREATH *UNUSUAL BRUISING OR BLEEDING *URINARY PROBLEMS (pain or burning when urinating, or frequent urination) *BOWEL PROBLEMS (unusual diarrhea, constipation, pain near the anus) TENDERNESS IN MOUTH AND THROAT WITH OR WITHOUT PRESENCE OF ULCERS (sore throat, sores in mouth, or a toothache) UNUSUAL RASH, SWELLING OR PAIN  UNUSUAL VAGINAL DISCHARGE OR ITCHING   Items with * indicate a potential emergency and should be followed up as soon as possible or go to the Emergency Department if any problems should occur.  Please show the CHEMOTHERAPY ALERT CARD or IMMUNOTHERAPY  ALERT CARD at check-in to the Emergency Department and triage nurse.  Should you have questions after your visit or need to cancel or reschedule your appointment, please contact [place] [facility]  Dept: [phone]  and follow the prompts.  Office hours are 8:00 a.m. to 4:30 p.m. Monday - Friday. Please note that voicemails left after 4:00 p.m. may not be returned until the following business day.  We are closed weekends and major holidays. You have access to a nurse at all times for urgent questions. Please call the main number to the clinic Dept: [phone] and follow the prompts.   For any non-urgent questions, you may also contact your provider using MyChart. We now offer e-Visits for anyone 6 and older to request care online for non-urgent symptoms. For details visit [email or web address].   Also download the MyChart app! Go to the app store, search "MyChart", open the app, select [facility], and log in with your MyChart username and password.  Oxaliplatin Injection What is this medication? OXALIPLATIN (ox AL i PLA tin) treats colorectal cancer. It works by slowing down the growth of cancer cells. This medicine may be used for other purposes; ask your health care provider or pharmacist if you have questions. COMMON BRAND NAME(S): Eloxatin What should I tell my care team before I take this medication? They need to know if you have any of these conditions: Heart disease History of irregular heartbeat or rhythm Liver disease Low blood cell levels (white cells, red cells, and platelets) Lung or breathing disease, such as asthma Take medications that treat or prevent  blood clots Tingling of the fingers, toes, or other nerve disorder An unusual or allergic reaction to oxaliplatin, other medications, foods, dyes, or preservatives If you or your partner are pregnant or trying to get pregnant Breast-feeding How should I use this medication? This medication  is injected into a vein. It is given by your care team in a hospital or clinic setting. Talk to your care team about the use of this medication in children. Special care may be needed. Overdosage: If you think you have taken too much of this medicine contact a poison control center or emergency room at once. NOTE: This medicine is only for you. Do not share this medicine with others. What if I miss a dose? Keep appointments for follow-up doses. It is important not to miss a dose. Call your care team if you are unable to keep an appointment. What may interact with this medication? Do not take this medication with any of the following: Cisapride Dronedarone Pimozide Thioridazine This medication may also interact with the following: Aspirin and aspirin-like medications Certain medications that treat or prevent blood clots, such as warfarin, apixaban, dabigatran, and rivaroxaban Cisplatin Cyclosporine Diuretics Medications for infection, such as acyclovir, adefovir, amphotericin B, bacitracin, cidofovir, foscarnet, ganciclovir, gentamicin, pentamidine, vancomycin NSAIDs, medications for pain and inflammation, such as ibuprofen or naproxen Other medications that cause heart rhythm changes Pamidronate Zoledronic acid This list may not describe all possible interactions. Give your health care provider a list of all the medicines, herbs, non-prescription drugs, or dietary supplements you use. Also tell them if you smoke, drink alcohol, or use illegal drugs. Some items may interact with your medicine. What should I watch for while using this medication? Your condition will be monitored carefully while you are receiving this medication. You may need blood work while taking this medication. This medication may make you feel generally unwell. This is not uncommon as chemotherapy can affect healthy cells as well as cancer cells. Report any side effects. Continue your course of treatment even though you  feel ill unless your care team tells you to stop. This medication may increase your risk of getting an infection. Call your care team for advice if you get a fever, chills, sore throat, or other symptoms of a cold or flu. Do not treat yourself. Try to avoid being around people who are sick. Avoid taking medications that contain aspirin, acetaminophen, ibuprofen, naproxen, or ketoprofen unless instructed by your care team. These medications may hide a fever. Be careful brushing or flossing your teeth or using a toothpick because you may get an infection or bleed more easily. If you have any dental work done, tell your dentist you are receiving this medication. This medication can make you more sensitive to cold. Do not drink cold drinks or use ice. Cover exposed skin before coming in contact with cold temperatures or cold objects. When out in cold weather wear warm clothing and cover your mouth and nose to warm the air that goes into your lungs. Tell your care team if you get sensitive to the cold. Talk to your care team if you or your partner are pregnant or think either of you might be pregnant. This medication can cause serious birth defects if taken during pregnancy and for 9 months after the last dose. A negative pregnancy test is required before starting this medication. A reliable form of contraception is recommended while taking this medication and for 9 months after the last dose. Talk to your care team about  effective forms of contraception. Do not father a child while taking this medication and for 6 months after the last dose. Use a condom while having sex during this time period. Do not breastfeed while taking this medication and for 3 months after the last dose. This medication may cause infertility. Talk to your care team if you are concerned about your fertility. What side effects may I notice from receiving this medication? Side effects that you should report to your care team as soon as  possible: Allergic reactions--skin rash, itching, hives, swelling of the face, lips, tongue, or throat Bleeding--bloody or black, tar-like stools, vomiting blood or brown material that looks like coffee grounds, red or dark brown urine, small red or purple spots on skin, unusual bruising or bleeding Dry cough, shortness of breath or trouble breathing Heart rhythm changes--fast or irregular heartbeat, dizziness, feeling faint or lightheaded, chest pain, trouble breathing Infection--fever, chills, cough, sore throat, wounds that don't heal, pain or trouble when passing urine, general feeling of discomfort or being unwell Liver injury--right upper belly pain, loss of appetite, nausea, light-colored stool, dark yellow or brown urine, yellowing skin or eyes, unusual weakness or fatigue Low red blood cell level--unusual weakness or fatigue, dizziness, headache, trouble breathing Muscle injury--unusual weakness or fatigue, muscle pain, dark yellow or brown urine, decrease in amount of urine Pain, tingling, or numbness in the hands or feet Sudden and severe headache, confusion, change in vision, seizures, which may be signs of posterior reversible encephalopathy syndrome (PRES) Unusual bruising or bleeding Side effects that usually do not require medical attention (report to your care team if they continue or are bothersome): Diarrhea Nausea Pain, redness, or swelling with sores inside the mouth or throat Unusual weakness or fatigue Vomiting This list may not describe all possible side effects. Call your doctor for medical advice about side effects. You may report side effects to FDA at 1-800-FDA-1088. Where should I keep my medication? This medication is given in a hospital or clinic. It will not be stored at home. NOTE: This sheet is a summary. It may not cover all possible information. If you have questions about this medicine, talk to your doctor, pharmacist, or health care provider.  2024  Elsevier/Gold Standard (2022-01-09 00:00:00) Fluorouracil Injection What is this medication? FLUOROURACIL (flure oh YOOR a sil) treats some types of cancer. It works by slowing down the growth of cancer cells. This medicine may be used for other purposes; ask your health care provider or pharmacist if you have questions. COMMON BRAND NAME(S): Adrucil What should I tell my care team before I take this medication? They need to know if you have any of these conditions: Blood disorders Dihydropyrimidine dehydrogenase (DPD) deficiency Infection, such as chickenpox, cold sores, herpes Kidney disease Liver disease Poor nutrition Recent or ongoing radiation therapy An unusual or allergic reaction to fluorouracil, other medications, foods, dyes, or preservatives If you or your partner are pregnant or trying to get pregnant Breast-feeding How should I use this medication? This medication is injected into a vein. It is administered by your care team in a hospital or clinic setting. Talk to your care team about the use of this medication in children. Special care may be needed. Overdosage: If you think you have taken too much of this medicine contact a poison control center or emergency room at once. NOTE: This medicine is only for you. Do not share this medicine with others. What if I miss a dose? Keep appointments for follow-up doses. It  is important not to miss your dose. Call your care team if you are unable to keep an appointment. What may interact with this medication? Do not take this medication with any of the following: Live virus vaccines This medication may also interact with the following: Medications that treat or prevent blood clots, such as warfarin, enoxaparin, dalteparin This list may not describe all possible interactions. Give your health care provider a list of all the medicines, herbs, non-prescription drugs, or dietary supplements you use. Also tell them if you smoke, drink  alcohol, or use illegal drugs. Some items may interact with your medicine. What should I watch for while using this medication? Your condition will be monitored carefully while you are receiving this medication. This medication may make you feel generally unwell. This is not uncommon as chemotherapy can affect healthy cells as well as cancer cells. Report any side effects. Continue your course of treatment even though you feel ill unless your care team tells you to stop. In some cases, you may be given additional medications to help with side effects. Follow all directions for their use. This medication may increase your risk of getting an infection. Call your care team for advice if you get a fever, chills, sore throat, or other symptoms of a cold or flu. Do not treat yourself. Try to avoid being around people who are sick. This medication may increase your risk to bruise or bleed. Call your care team if you notice any unusual bleeding. Be careful brushing or flossing your teeth or using a toothpick because you may get an infection or bleed more easily. If you have any dental work done, tell your dentist you are receiving this medication. Avoid taking medications that contain aspirin, acetaminophen, ibuprofen, naproxen, or ketoprofen unless instructed by your care team. These medications may hide a fever. Do not treat diarrhea with over the counter products. Contact your care team if you have diarrhea that lasts more than 2 days or if it is severe and watery. This medication can make you more sensitive to the sun. Keep out of the sun. If you cannot avoid being in the sun, wear protective clothing and sunscreen. Do not use sun lamps, tanning beds, or tanning booths. Talk to your care team if you or your partner wish to become pregnant or think you might be pregnant. This medication can cause serious birth defects if taken during pregnancy and for 3 months after the last dose. A reliable form of  contraception is recommended while taking this medication and for 3 months after the last dose. Talk to your care team about effective forms of contraception. Do not father a child while taking this medication and for 3 months after the last dose. Use a condom while having sex during this time period. Do not breastfeed while taking this medication. This medication may cause infertility. Talk to your care team if you are concerned about your fertility. What side effects may I notice from receiving this medication? Side effects that you should report to your care team as soon as possible: Allergic reactions--skin rash, itching, hives, swelling of the face, lips, tongue, or throat Heart attack--pain or tightness in the chest, shoulders, arms, or jaw, nausea, shortness of breath, cold or clammy skin, feeling faint or lightheaded Heart failure--shortness of breath, swelling of the ankles, feet, or hands, sudden weight gain, unusual weakness or fatigue Heart rhythm changes--fast or irregular heartbeat, dizziness, feeling faint or lightheaded, chest pain, trouble breathing High ammonia level--unusual weakness  or fatigue, confusion, loss of appetite, nausea, vomiting, seizures Infection--fever, chills, cough, sore throat, wounds that don't heal, pain or trouble when passing urine, general feeling of discomfort or being unwell Low red blood cell level--unusual weakness or fatigue, dizziness, headache, trouble breathing Pain, tingling, or numbness in the hands or feet, muscle weakness, change in vision, confusion or trouble speaking, loss of balance or coordination, trouble walking, seizures Redness, swelling, and blistering of the skin over hands and feet Severe or prolonged diarrhea Unusual bruising or bleeding Side effects that usually do not require medical attention (report to your care team if they continue or are bothersome): Dry skin Headache Increased tears Nausea Pain, redness, or swelling with  sores inside the mouth or throat Sensitivity to light Vomiting This list may not describe all possible side effects. Call your doctor for medical advice about side effects. You may report side effects to FDA at 1-800-FDA-1088. Where should I keep my medication? This medication is given in a hospital or clinic. It will not be stored at home. NOTE: This sheet is a summary. It may not cover all possible information. If you have questions about this medicine, talk to your doctor, pharmacist, or health care provider.  2024 Elsevier/Gold Standard (2021-08-01 00:00:00) Leucovorin Injection What is this medication? LEUCOVORIN (loo koe VOR in) prevents side effects from certain medications, such as methotrexate. It works by increasing folate levels. This helps protect healthy cells in your body. It may also be used to treat anemia caused by low levels of folate. It can also be used with fluorouracil, a type of chemotherapy, to treat colorectal cancer. It works by increasing the effects of fluorouracil in the body. This medicine may be used for other purposes; ask your health care provider or pharmacist if you have questions. What should I tell my care team before I take this medication? They need to know if you have any of these conditions: Anemia from low levels of vitamin B12 in the blood An unusual or allergic reaction to leucovorin, folic acid, other medications, foods, dyes, or preservatives Pregnant or trying to get pregnant Breastfeeding How should I use this medication? This medication is injected into a vein or a muscle. It is given by your care team in a hospital or clinic setting. Talk to your care team about the use of this medication in children. Special care may be needed. Overdosage: If you think you have taken too much of this medicine contact a poison control center or emergency room at once. NOTE: This medicine is only for you. Do not share this medicine with others. What if I miss a  dose? Keep appointments for follow-up doses. It is important not to miss your dose. Call your care team if you are unable to keep an appointment. What may interact with this medication? Capecitabine Fluorouracil Phenobarbital Phenytoin Primidone Trimethoprim;sulfamethoxazole This list may not describe all possible interactions. Give your health care provider a list of all the medicines, herbs, non-prescription drugs, or dietary supplements you use. Also tell them if you smoke, drink alcohol, or use illegal drugs. Some items may interact with your medicine. What should I watch for while using this medication? Your condition will be monitored carefully while you are receiving this medication. This medication may increase the side effects of 5-fluorouracil. Tell your care team if you have diarrhea or mouth sores that do not get better or that get worse. What side effects may I notice from receiving this medication? Side effects that you should  report to your care team as soon as possible: Allergic reactions--skin rash, itching, hives, swelling of the face, lips, tongue, or throat This list may not describe all possible side effects. Call your doctor for medical advice about side effects. You may report side effects to FDA at 1-800-FDA-1088. Where should I keep my medication? This medication is given in a hospital or clinic. It will not be stored at home. NOTE: This sheet is a summary. It may not cover all possible information. If you have questions about this medicine, talk to your doctor, pharmacist, or health care provider.  2024 Elsevier/Gold Standard (2021-08-29 00:00:00) Iron Sucrose Injection What is this medication? IRON SUCROSE (EYE ern SOO krose) treats low levels of iron (iron deficiency anemia) in people with kidney disease. Iron is a mineral that plays an important role in making red blood cells, which carry oxygen from your lungs to the rest of your body. This medicine may be used  for other purposes; ask your health care provider or pharmacist if you have questions. COMMON BRAND NAME(S): Venofer What should I tell my care team before I take this medication? They need to know if you have any of these conditions: Anemia not caused by low iron levels Heart disease High levels of iron in the blood Kidney disease Liver disease An unusual or allergic reaction to iron, other medications, foods, dyes, or preservatives Pregnant or trying to get pregnant Breastfeeding How should I use this medication? This medication is for infusion into a vein. It is given in a hospital or clinic setting. Talk to your care team about the use of this medication in children. While this medication may be prescribed for children as young as 2 years for selected conditions, precautions do apply. Overdosage: If you think you have taken too much of this medicine contact a poison control center or emergency room at once. NOTE: This medicine is only for you. Do not share this medicine with others. What if I miss a dose? Keep appointments for follow-up doses. It is important not to miss your dose. Call your care team if you are unable to keep an appointment. What may interact with this medication? Do not take this medication with any of the following: Deferoxamine Dimercaprol Other iron products This medication may also interact with the following: Chloramphenicol Deferasirox This list may not describe all possible interactions. Give your health care provider a list of all the medicines, herbs, non-prescription drugs, or dietary supplements you use. Also tell them if you smoke, drink alcohol, or use illegal drugs. Some items may interact with your medicine. What should I watch for while using this medication? Visit your care team regularly. Tell your care team if your symptoms do not start to get better or if they get worse. You may need blood work done while you are taking this medication. You may  need to follow a special diet. Talk to your care team. Foods that contain iron include: whole grains/cereals, dried fruits, beans, or peas, leafy green vegetables, and organ meats (liver, kidney). What side effects may I notice from receiving this medication? Side effects that you should report to your care team as soon as possible: Allergic reactions--skin rash, itching, hives, swelling of the face, lips, tongue, or throat Low blood pressure--dizziness, feeling faint or lightheaded, blurry vision Shortness of breath Side effects that usually do not require medical attention (report to your care team if they continue or are bothersome): Flushing Headache Joint pain Muscle pain Nausea Pain, redness,  or irritation at injection site This list may not describe all possible side effects. Call your doctor for medical advice about side effects. You may report side effects to FDA at 1-800-FDA-1088. Where should I keep my medication? This medication is given in a hospital or clinic. It will not be stored at home. NOTE: This sheet is a summary. It may not cover all possible information. If you have questions about this medicine, talk to your doctor, pharmacist, or health care provider.  2024 Elsevier/Gold Standard (2022-08-31 00:00:00)

## 2022-12-19 ENCOUNTER — Telehealth: Payer: Self-pay

## 2022-12-19 ENCOUNTER — Other Ambulatory Visit: Payer: 59

## 2022-12-19 ENCOUNTER — Encounter: Payer: Self-pay | Admitting: Hematology

## 2022-12-19 ENCOUNTER — Telehealth: Payer: Self-pay | Admitting: Hematology

## 2022-12-19 ENCOUNTER — Other Ambulatory Visit: Payer: Self-pay

## 2022-12-19 ENCOUNTER — Ambulatory Visit: Payer: 59

## 2022-12-19 DIAGNOSIS — C186 Malignant neoplasm of descending colon: Secondary | ICD-10-CM

## 2022-12-19 NOTE — Telephone Encounter (Signed)
-----   Message from Nurse Ashby Dawes sent at 12/18/2022  4:05 PM EDT ----- Regarding: First time/ Folfox/ Dr Mosetta Putt pt Pt had first time FOLFOX today. Tolerated well.  Thanks!

## 2022-12-19 NOTE — Telephone Encounter (Signed)
Left voicemail for patient that  [employer] FMLA documents had been completed and faxed to company. Copy of documents mailed to patient per request.

## 2022-12-19 NOTE — Telephone Encounter (Signed)
LM for patient that this nurse was calling to see how they were doing after their treatment. Please call back to Dr.  Feng's nurse at [phone] if they have any questions or concerns regarding the treatment. 

## 2022-12-20 ENCOUNTER — Inpatient Hospital Stay: Payer: 59

## 2022-12-20 ENCOUNTER — Other Ambulatory Visit: Payer: Self-pay

## 2022-12-20 ENCOUNTER — Encounter: Payer: Self-pay | Admitting: Nurse Practitioner

## 2022-12-20 VITALS — BP 145/73 | HR 81 | Temp 99.0°F | Resp 20

## 2022-12-20 DIAGNOSIS — C186 Malignant neoplasm of descending colon: Secondary | ICD-10-CM

## 2022-12-20 DIAGNOSIS — Z5111 Encounter for antineoplastic chemotherapy: Secondary | ICD-10-CM | POA: Diagnosis not present

## 2022-12-20 DIAGNOSIS — D500 Iron deficiency anemia secondary to blood loss (chronic): Secondary | ICD-10-CM

## 2022-12-20 MED ORDER — ONDANSETRON HCL 8 MG PO TABS: 8.0000 mg | ORAL_TABLET | Freq: Three times a day (TID) | ORAL | 1 refills | Status: DC | PRN

## 2022-12-20 MED ORDER — SODIUM CHLORIDE 0.9% FLUSH
10.0000 mL | Freq: Once | INTRAVENOUS | Status: AC | PRN
  Administered 2022-12-20: 10 mL

## 2022-12-20 MED ORDER — PROCHLORPERAZINE MALEATE 10 MG PO TABS: 10.0000 mg | ORAL_TABLET | Freq: Four times a day (QID) | ORAL | 1 refills | Status: DC | PRN

## 2022-12-20 MED ORDER — HEPARIN SOD (PORK) LOCK FLUSH 100 UNIT/ML IV SOLN
500.0000 [IU] | Freq: Once | INTRAVENOUS | Status: AC | PRN
  Administered 2022-12-20: 500 [IU]

## 2022-12-20 MED ORDER — LIDOCAINE-PRILOCAINE 2.5-2.5 % EX CREA: TOPICAL_CREAM | CUTANEOUS | 3 refills | Status: DC

## 2022-12-20 MED ORDER — DEXAMETHASONE 4 MG PO TABS
8.0000 mg | ORAL_TABLET | Freq: Every day | ORAL | 1 refills | Status: DC
Start: 2022-12-20 — End: 2023-05-05

## 2022-12-20 NOTE — Progress Notes (Signed)
Pt. Here for pump stop & D/C.  Request a note to be out of work today.  Secure Chatted Dr. Mosetta Putt.  Heather/NP wrote note and note given to pt.

## 2022-12-24 IMAGING — MG MM DIGITAL SCREENING BILAT W/ TOMO AND CAD
6 of 10 series · 6 of 30 positions shown · non-contrast
Comparison: Previous exam(s).

CLINICAL DATA: Screening.

EXAM:
DIGITAL SCREENING BILATERAL MAMMOGRAM WITH TOMOSYNTHESIS AND CAD
TECHNIQUE: Bilateral screening digital craniocaudal and mediolateral oblique
mammograms were obtained. Bilateral screening digital breast
tomosynthesis was performed. The images were evaluated with
computer-aided detection.

[R MLO synth-2D]
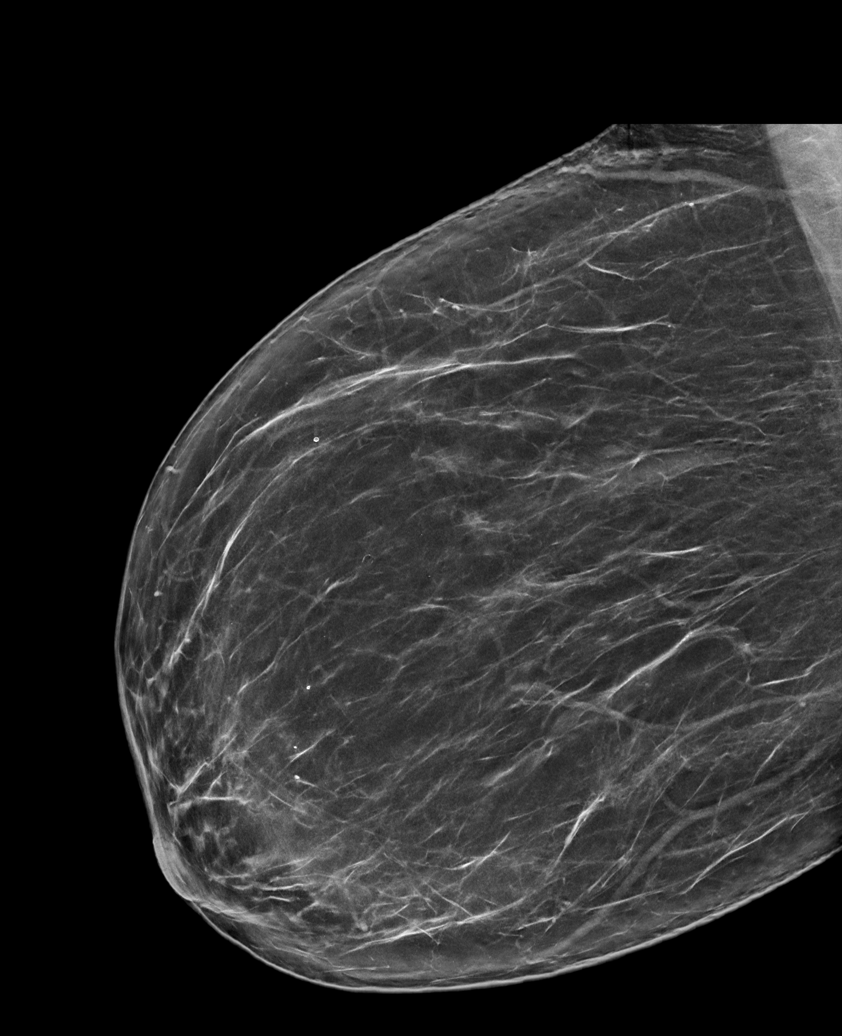

[L CC synth-2D]
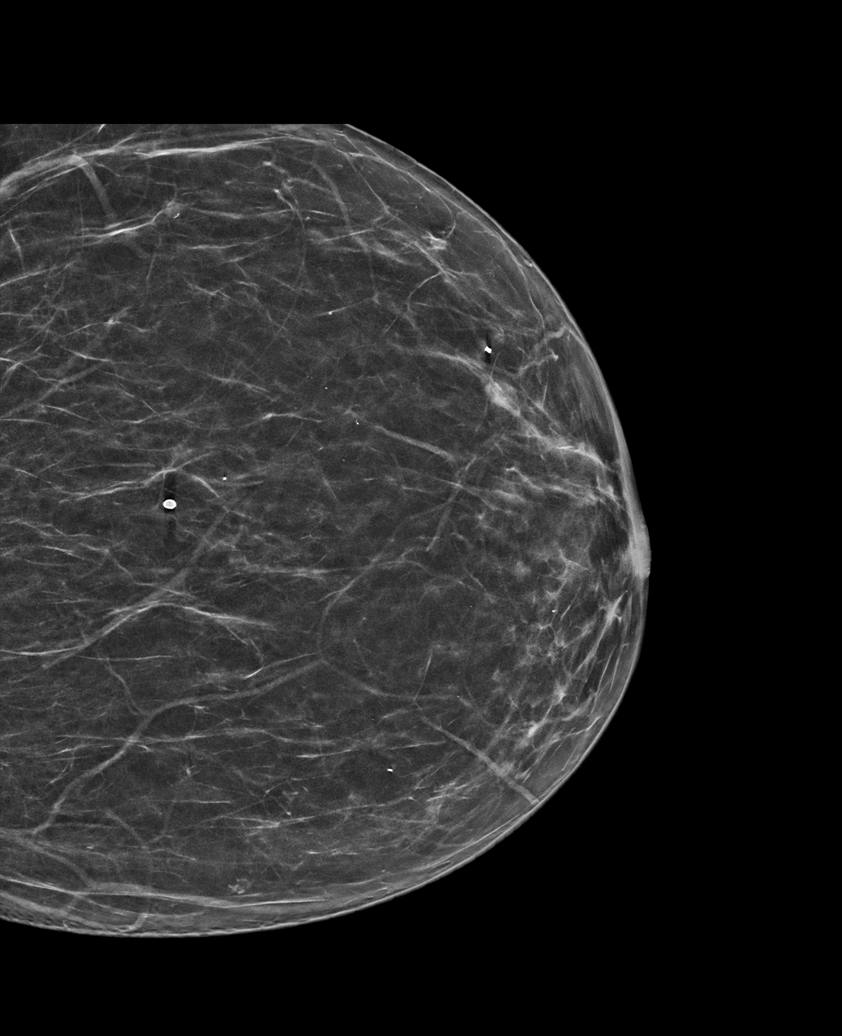

[R CC synth-2D]
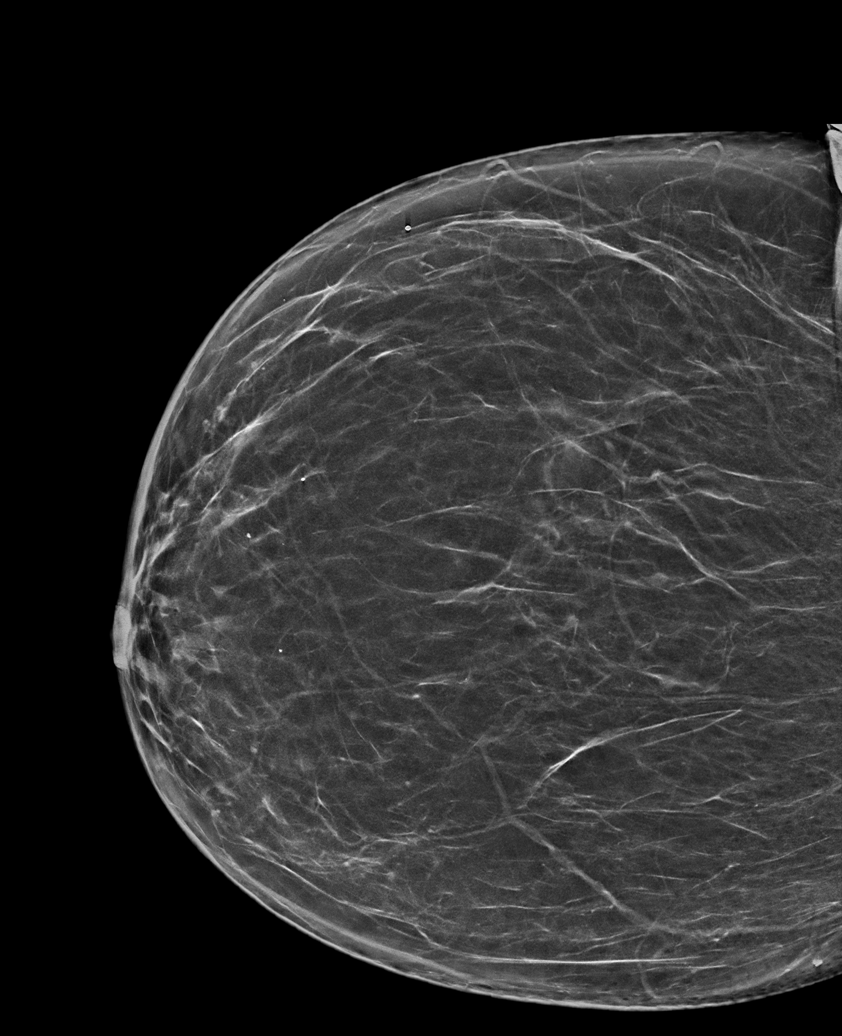

[L MLO synth-2D (1 of 2)]
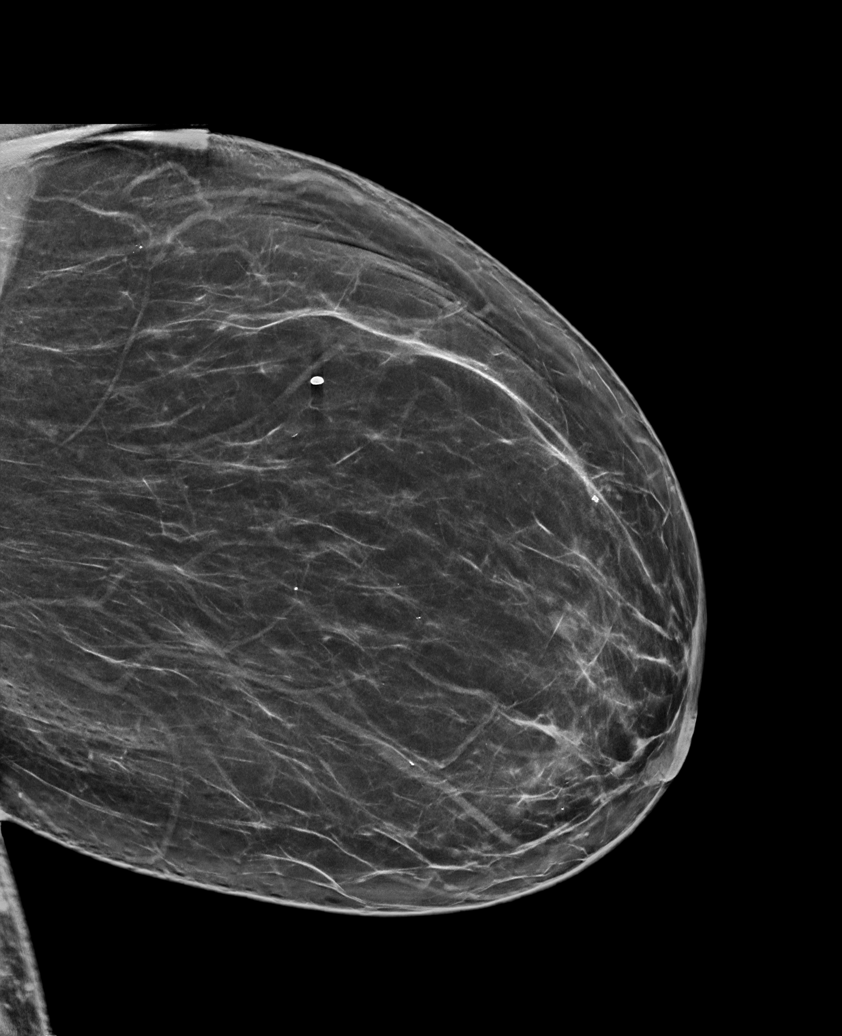

[L MLO synth-2D (2 of 2)]
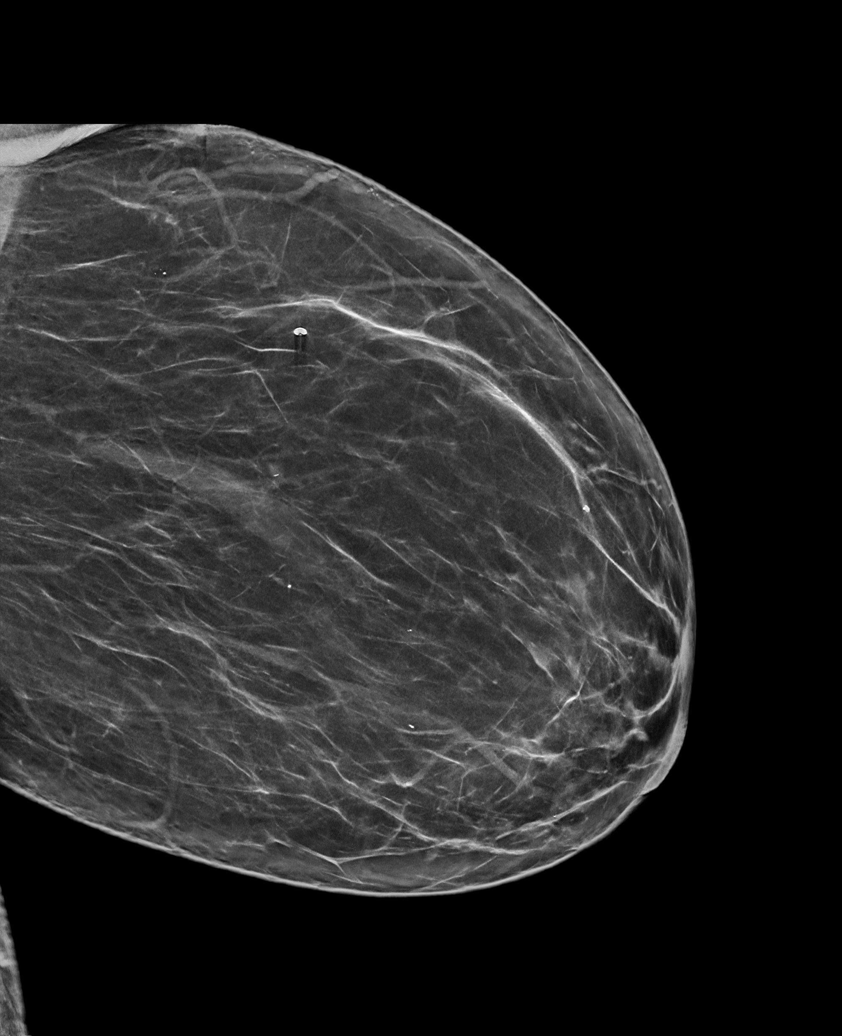

[R CC tomo · tomo slice 36/71.0]
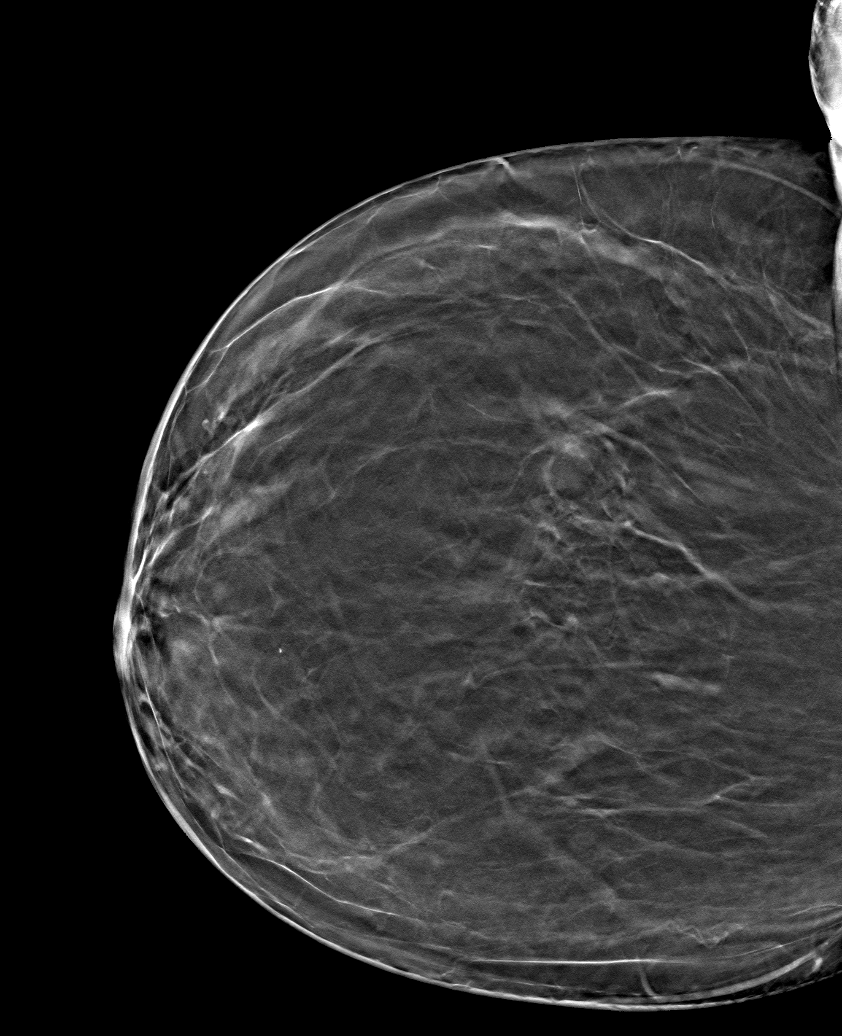

[6 of 30 positions shown; findings below may reference images not displayed]

ACR Breast Density Category b: There are scattered areas of
fibroglandular density.
FINDINGS: There are no findings suspicious for malignancy.
IMPRESSION: No mammographic evidence of malignancy. A result letter of this
screening mammogram will be mailed directly to the patient.

RECOMMENDATION:
Screening mammogram in one year. (Code:51-O-LD2)

BI-RADS CATEGORY  1: Negative.

## 2022-12-26 ENCOUNTER — Encounter (HOSPITAL_COMMUNITY)
Admission: RE | Admit: 2022-12-26 | Discharge: 2022-12-26 | Disposition: A | Discharge status: Home / Self Care | Payer: 59 | Source: Ambulatory Visit | Attending: Hematology | Admitting: Hematology

## 2022-12-26 DIAGNOSIS — C186 Malignant neoplasm of descending colon: Secondary | ICD-10-CM | POA: Insufficient documentation

## 2022-12-26 LAB — GLUCOSE, CAPILLARY: Glucose-Capillary: 109 mg/dL — ABNORMAL HIGH (ref 70–99)

## 2022-12-26 MED ORDER — FLUDEOXYGLUCOSE F - 18 (FDG) INJECTION
12.0000 | Freq: Once | INTRAVENOUS | Status: AC
  Administered 2022-12-26: 12 via INTRAVENOUS

## 2022-12-31 ENCOUNTER — Encounter: Payer: Self-pay | Admitting: Hematology

## 2022-12-31 MED FILL — Dexamethasone Sodium Phosphate Inj 100 MG/10ML: INTRAMUSCULAR | Qty: 1 | Status: AC

## 2022-12-31 NOTE — Progress Notes (Unsigned)
Patient Care Team: Early, Sung Amabile, NP as PCP - General (Nurse Practitioner) Tammy Mood, MD as Consulting Physician (Hematology and Oncology) Tammy Levee, MD as Consulting Physician (General Surgery) Tammy Bologna, DO as Consulting Physician (Gastroenterology)   CHIEF COMPLAINT: Follow-up colon cancer with oligo liver metastasis  CURRENT THERAPY: Neoadjuvant FOLFOX and IV iron PRN for IDA  INTERVAL HISTORY Tammy Marks returns for follow up and treatment as scheduled. Last seen by Tammy Marks 12/12/22, began C1 FOLFOX and venofer 9/10. Had a PET scan 9/18. She did well with chemo.  She had mild fatigue and cold sensitivity for 3-4 days, but remained out of bed, active and functional.  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  Mild constipation resolved with increased water intake.  She continues oral iron, stools are dark but not bloody.  Denies nausea/vomiting, fever, chills, leg edema, dyspnea, rash, or any other new or specific complaints.  ROS  All other systems reviewed and negative  Past Medical History:  Diagnosis Date   Diabetes mellitus without complication (HCC)      Past Surgical History:  Procedure Laterality Date   CESAREAN SECTION     CHOLECYSTECTOMY     IR IMAGING GUIDED PORT INSERTION  12/03/2022   IR US LIVER BIOPSY  12/03/2022     Outpatient Encounter Medications as of 01/01/2023  Medication Sig   atorvastatin (LIPITOR) 20 MG tablet Take 20 mg by mouth daily.   azelastine (OPTIVAR) 0.05 % ophthalmic solution Place 1 drop into both eyes 2 (two) times daily.   dexamethasone (DECADRON) 4 MG tablet Take 2 tablets (8 mg total) by mouth daily. Start the day after chemotherapy for 2 days. Take with food.   Iron, Ferrous Sulfate, 325 (65 Fe) MG TABS Take 325 mg by mouth daily.   lidocaine-prilocaine (EMLA) cream Apply to affected area once   nystatin (MYCOSTATIN/NYSTOP) powder Apply 1 application to skin folds 3 (three) times daily as needed    ondansetron (ZOFRAN) 8 MG tablet Take 1 tablet (8 mg total) by mouth every 8 (eight) hours as needed for nausea or vomiting. Start on the third day after chemotherapy.   potassium chloride SA (KLOR-CON M) 20 MEQ tablet Take 1 tablet (20 mEq total) by mouth 2 (two) times daily. Take 1 tablet 3 times daily for 3 days, then twice daily   prochlorperazine (COMPAZINE) 10 MG tablet Take 1 tablet (10 mg total) by mouth every 6 (six) hours as needed for nausea or vomiting.   [DISCONTINUED] simvastatin (ZOCOR) 20 MG tablet Take 1 tablet (20 mg total) by mouth at bedtime.   Facility-Administered Encounter Medications as of 01/01/2023  Medication   [DISCONTINUED] potassium chloride SA (KLOR-CON M) CR tablet 20 mEq     Today's Vitals   01/01/23 0900 01/01/23 0919  BP:  (!) 146/85  Pulse:  92  Resp:  18  Temp:  98.1 F (36.7 C)  TempSrc:  Oral  SpO2:  100%  Weight:  275 lb 5 oz (124.9 kg)  Height:  5\' 5"  (1.651 m)  PainSc: 0-No pain    Body mass index is 45.81 kg/m.   PHYSICAL EXAM GENERAL:alert, no distress and comfortable SKIN: no rash  EYES: sclera clear NECK: without mass LYMPH:  no palpable cervical or supraclavicular lymphadenopathy  LUNGS: clear with normal breathing effort HEART: regular rate & rhythm, no lower extremity edema ABDOMEN: abdomen soft, non-tender and normal bowel sounds NEURO: alert & oriented x 3 with fluent speech, no  focal motor deficits PAC without erythema    CBC    Component Value Date/Time   WBC 5.7 01/01/2023 0824   WBC 8.5 12/03/2022 0701   RBC 3.90 01/01/2023 0824   HGB 7.8 (L) 01/01/2023 0824   HGB 11.5 05/29/2021 1318   HCT 25.9 (L) 01/01/2023 0824   HCT 37.1 05/29/2021 1318   PLT 478 (H) 01/01/2023 0824   PLT 490 (H) 05/29/2021 1318   MCV 66.4 (L) 01/01/2023 0824   MCV 75 (L) 05/29/2021 1318   MCH 20.0 (L) 01/01/2023 0824   MCHC 30.1 01/01/2023 0824   RDW 24.5 (H) 01/01/2023 0824   RDW 14.2 05/29/2021 1318   LYMPHSABS 2.1 01/01/2023 0824    LYMPHSABS 3.1 05/29/2021 1318   MONOABS 0.6 01/01/2023 0824   EOSABS 0.1 01/01/2023 0824   EOSABS 0.1 05/29/2021 1318   BASOSABS 0.0 01/01/2023 0824   BASOSABS 0.1 05/29/2021 1318     CMP     Component Value Date/Time   NA 141 01/01/2023 0824   NA 136 05/29/2021 1318   K 3.0 (L) 01/01/2023 0824   CL 104 01/01/2023 0824   CO2 28 01/01/2023 0824   GLUCOSE 102 (H) 01/01/2023 0824   BUN 7 01/01/2023 0824   BUN 7 05/29/2021 1318   CREATININE 0.65 01/01/2023 0824   CALCIUM 8.4 (L) 01/01/2023 0824   PROT 6.8 01/01/2023 0824   PROT 7.4 05/29/2021 1318   ALBUMIN 3.5 01/01/2023 0824   ALBUMIN 4.1 05/29/2021 1318   AST 12 (L) 01/01/2023 0824   ALT 10 01/01/2023 0824   ALKPHOS 58 01/01/2023 0824   BILITOT 0.3 01/01/2023 0824   GFRNONAA >60 01/01/2023 0824   GFRAA >60 02/24/2019 1647     ASSESSMENT & PLAN:Tammy Marks is a 57 y.o. female with     Cancer of left colon, cTxN0M1, stage IV with oligo liver metastasis, MMR normal -She has 2 synchronized colorectal cancer.  The left descending colon cancer was only 4 mm, removed by polypectomy. The largest 3 cm, fungating mass in the rectosigmoid junction is 15 cm from anal verge on colonoscopy, biopsy confirmed moderate differentiated adenocarcinoma -Staging CT chest from 11/18/22, which showed borderline enlarged thoracic adenopathy. PET scan for further evaluation. -Her liver MRI unfortunately showed a 1.2 cm mass in the posterior dome of liver, segment 7, biopsy on 12/03/22 confirmed metastasis from colon cancer -Given her metastatic disease, she began chemo first for 3 to 6 months, then consider surgical resection of both her primary colon cancer and liver metastasis, if she has good response to chemotherapy. Consented to FOLFOX and port placed -The goal of neoadjuvant chemotherapy is curative -She has been seen by colorectal surgeon Dr. Maisie Marks -Tammy Marks appears stable, s/p C1 FOLFOX, tolerated very well with mild fatigue, cold  sensitivity, and constipation. SE's were adequately managed with supportive care at home. Able to recover after a few days, function well, and work.  -I reviewed her PET scan independently and with Tammy Marks, and the patient, which shows 2 hypermetabolic liver masses concerning for metastases. No other obvious distant metastasis. The report is not signed yet, will f/up -Continue same plan chemo for now, will review in GI conference to see if she is a candidate for liver resection or targeted therapy after chemo if she responds well. -Labs reviewed, Hgb 7.8, K 3.0. she will receive 1 unit blood transfusion on Saturday and start oral KCL -Proceed with C2 FOLFOX today, same dose -F/up and C3 in 2 weeks  Iron deficiency anemia due to chronic blood loss -Secondary to colon cancer -She received a blood transfusion in July 2024 due to hemoglobin 6.3 -Her iron level is very low, s/p first IV venofer for total 3 doses  -Next venofer in 2 weeks -Continue oral iron with vit C    PLAN: -PET scan and labs reviewed -Proceed with C2 FOLFOX today as scheduled, same dose -1u pRBC 9/28 (Draw T/S on 9/26 with pump d/c)  -Begin KCL, 20 mEq in clinic, TID x3 days then BID -Review PET/discuss case in tumor board next week -F/up, C3 FOLFOX, and Venofer in 2 weeks   All questions were answered. The patient knows to call the clinic with any problems, questions or concerns. No barriers to learning were detected. I spent 20 minutes counseling the patient face to face. The total time spent in the appointment was 30 minutes and more than 50% was on counseling, review of test results, and coordination of care.   Santiago Glad, NP-C 01/01/2023

## 2023-01-01 ENCOUNTER — Inpatient Hospital Stay: Payer: 59

## 2023-01-01 ENCOUNTER — Inpatient Hospital Stay (HOSPITAL_BASED_OUTPATIENT_CLINIC_OR_DEPARTMENT_OTHER): Payer: 59 | Admitting: Nurse Practitioner

## 2023-01-01 ENCOUNTER — Encounter: Payer: Self-pay | Admitting: Nurse Practitioner

## 2023-01-01 ENCOUNTER — Encounter: Payer: Self-pay | Admitting: Hematology

## 2023-01-01 VITALS — BP 146/85 | HR 92 | Temp 98.1°F | Resp 18 | Ht 65.0 in | Wt 275.3 lb

## 2023-01-01 DIAGNOSIS — Z95828 Presence of other vascular implants and grafts: Secondary | ICD-10-CM | POA: Insufficient documentation

## 2023-01-01 DIAGNOSIS — C186 Malignant neoplasm of descending colon: Secondary | ICD-10-CM

## 2023-01-01 DIAGNOSIS — Z5111 Encounter for antineoplastic chemotherapy: Secondary | ICD-10-CM | POA: Diagnosis not present

## 2023-01-01 LAB — CMP (CANCER CENTER ONLY)
ALT: 10 U/L (ref 0–44)
AST: 12 U/L — ABNORMAL LOW (ref 15–41)
Albumin: 3.5 g/dL (ref 3.5–5.0)
Alkaline Phosphatase: 58 U/L (ref 38–126)
Anion gap: 9 (ref 5–15)
BUN: 7 mg/dL (ref 6–20)
CO2: 28 mmol/L (ref 22–32)
Calcium: 8.4 mg/dL — ABNORMAL LOW (ref 8.9–10.3)
Chloride: 104 mmol/L (ref 98–111)
Creatinine: 0.65 mg/dL (ref 0.44–1.00)
GFR, Estimated: >60 mL/min (ref >60)
Glucose, Bld: 102 mg/dL — ABNORMAL HIGH (ref 70–99)
Potassium: 3 mmol/L — ABNORMAL LOW (ref 3.5–5.1)
Sodium: 141 mmol/L (ref 135–145)
Total Bilirubin: 0.3 mg/dL (ref 0.3–1.2)
Total Protein: 6.8 g/dL (ref 6.5–8.1)

## 2023-01-01 LAB — CBC WITH DIFFERENTIAL (CANCER CENTER ONLY)
Abs Immature Granulocytes: 0.01 10*3/uL (ref 0.00–0.07)
Basophils Absolute: 0 10*3/uL (ref 0.0–0.1)
Basophils Relative: 1 %
Eosinophils Absolute: 0.1 10*3/uL (ref 0.0–0.5)
Eosinophils Relative: 1 %
HCT: 25.9 % — ABNORMAL LOW (ref 36.0–46.0)
Hemoglobin: 7.8 g/dL — ABNORMAL LOW (ref 12.0–15.0)
Immature Granulocytes: 0 %
Lymphocytes Relative: 37 %
Lymphs Abs: 2.1 10*3/uL (ref 0.7–4.0)
MCH: 20 pg — ABNORMAL LOW (ref 26.0–34.0)
MCHC: 30.1 g/dL (ref 30.0–36.0)
MCV: 66.4 fL — ABNORMAL LOW (ref 80.0–100.0)
Monocytes Absolute: 0.6 10*3/uL (ref 0.1–1.0)
Monocytes Relative: 10 %
Neutro Abs: 2.9 10*3/uL (ref 1.7–7.7)
Neutrophils Relative %: 51 %
Platelet Count: 478 10*3/uL — ABNORMAL HIGH (ref 150–400)
RBC: 3.9 MIL/uL (ref 3.87–5.11)
RDW: 24.5 % — ABNORMAL HIGH (ref 11.5–15.5)
WBC Count: 5.7 10*3/uL (ref 4.0–10.5)
nRBC: 0 % (ref 0.0–0.2)

## 2023-01-01 LAB — MISCELLANEOUS TEST

## 2023-01-01 MED ORDER — LEUCOVORIN CALCIUM INJECTION 350 MG
400.0000 mg/m2 | Freq: Once | INTRAVENOUS | Status: AC
  Administered 2023-01-01: 948 mg via INTRAVENOUS
  Filled 2023-01-01: qty 35

## 2023-01-01 MED ORDER — POTASSIUM CHLORIDE CRYS ER 20 MEQ PO TBCR: 20.0000 meq | EXTENDED_RELEASE_TABLET | Freq: Once | ORAL | Status: DC

## 2023-01-01 MED ORDER — PALONOSETRON HCL INJECTION 0.25 MG/5ML
0.2500 mg | Freq: Once | INTRAVENOUS | Status: AC
  Administered 2023-01-01: 0.25 mg via INTRAVENOUS
  Filled 2023-01-01: qty 5

## 2023-01-01 MED ORDER — OXALIPLATIN CHEMO INJECTION 100 MG/20ML
85.0000 mg/m2 | Freq: Once | INTRAVENOUS | Status: AC
  Administered 2023-01-01: 200 mg via INTRAVENOUS
  Filled 2023-01-01: qty 40

## 2023-01-01 MED ORDER — SODIUM CHLORIDE 0.9 % IV SOLN
10.0000 mg | Freq: Once | INTRAVENOUS | Status: AC
  Administered 2023-01-01: 10 mg via INTRAVENOUS
  Filled 2023-01-01: qty 10

## 2023-01-01 MED ORDER — SODIUM CHLORIDE 0.9% FLUSH
10.0000 mL | Freq: Once | INTRAVENOUS | Status: AC
  Administered 2023-01-01: 10 mL

## 2023-01-01 MED ORDER — FLUOROURACIL CHEMO INJECTION 2.5 GM/50ML
400.0000 mg/m2 | Freq: Once | INTRAVENOUS | Status: AC
  Administered 2023-01-01: 1000 mg via INTRAVENOUS
  Filled 2023-01-01: qty 20

## 2023-01-01 MED ORDER — SODIUM CHLORIDE 0.9 % IV SOLN
2400.0000 mg/m2 | INTRAVENOUS | Status: DC
  Administered 2023-01-01: 5700 mg via INTRAVENOUS
  Filled 2023-01-01: qty 114

## 2023-01-01 MED ORDER — DEXTROSE 5 % IV SOLN: Freq: Once | INTRAVENOUS | Status: AC

## 2023-01-01 MED ORDER — ACETAMINOPHEN 325 MG PO TABS
650.0000 mg | ORAL_TABLET | Freq: Once | ORAL | Status: AC
  Administered 2023-01-01: 650 mg via ORAL
  Filled 2023-01-01: qty 2

## 2023-01-01 MED ORDER — POTASSIUM CHLORIDE CRYS ER 20 MEQ PO TBCR
20.0000 meq | EXTENDED_RELEASE_TABLET | Freq: Once | ORAL | Status: AC
  Administered 2023-01-01: 20 meq via ORAL
  Filled 2023-01-01: qty 1

## 2023-01-01 MED ORDER — POTASSIUM CHLORIDE CRYS ER 20 MEQ PO TBCR: 20.0000 meq | EXTENDED_RELEASE_TABLET | Freq: Two times a day (BID) | ORAL | 0 refills | Status: DC

## 2023-01-01 NOTE — Progress Notes (Signed)
Per Santiago Glad, NP, ok to tx with Hgb 7.8.

## 2023-01-01 NOTE — Patient Instructions (Signed)
[facility]  Discharge Instructions: Thank you for choosing [facility] to provide your oncology and hematology care.   If you have a lab appointment with the Cancer Center, please go directly to the Cancer Center and check [place] at the registration area.   Wear comfortable clothing and clothing appropriate for easy access to any Portacath or PICC line.   We strive to give you quality time with your provider. You may need to reschedule your appointment if you arrive late (15 or more minutes).  Arriving late affects you and other patients whose appointments are after yours.  Also, if you miss three or more appointments without notifying the office, you may be dismissed from the clinic at the provider's discretion.      For prescription refill requests, have your pharmacy contact our office and allow 72 hours for refills to be completed.    Today you received the following chemotherapy and/or immunotherapy agents: Oxaliplatin, Leucovorin, Fluorouracil.       To help prevent nausea and vomiting after your treatment, we encourage you to take your nausea medication as directed.  BELOW ARE SYMPTOMS THAT SHOULD BE REPORTED IMMEDIATELY: *FEVER GREATER THAN 100.4 F (38 C) OR HIGHER *CHILLS OR SWEATING *NAUSEA AND VOMITING THAT IS NOT CONTROLLED WITH YOUR NAUSEA MEDICATION *UNUSUAL SHORTNESS OF BREATH *UNUSUAL BRUISING OR BLEEDING *URINARY PROBLEMS (pain or burning when urinating, or frequent urination) *BOWEL PROBLEMS (unusual diarrhea, constipation, pain near the anus) TENDERNESS Berenice MOUTH AND THROAT WITH OR WITHOUT PRESENCE OF ULCERS (sore throat, sores Aariah mouth, or a toothache) UNUSUAL RASH, SWELLING OR PAIN  UNUSUAL VAGINAL DISCHARGE OR ITCHING   Items with * indicate a potential emergency and should be followed up as soon as possible or go to the Emergency Department if any problems should occur.  Please show the CHEMOTHERAPY ALERT CARD or  IMMUNOTHERAPY ALERT CARD at check-Gail to the Emergency Department and triage nurse.  Should you have questions after your visit or need to cancel or reschedule your appointment, please contact [facility]  Dept: [phone]  and follow the prompts.  Office hours are 8:00 a.m. to 4:30 p.m. Monday - Friday. Please note that voicemails left after 4:00 p.m. may not be returned until the following business day.  We are closed weekends and major holidays. You have access to a nurse at all times for urgent questions. Please call the main number to the clinic Dept: [phone] and follow the prompts.   For any non-urgent questions, you may also contact your provider using MyChart. We now offer e-Visits for anyone 18 and older to request care online for non-urgent symptoms. For details visit [email or web address].   Also download the MyChart app! Go to the app store, search "MyChart", open the app, select [facility], and log [place] with your MyChart username and password.   

## 2023-01-02 ENCOUNTER — Ambulatory Visit: Payer: 59 | Admitting: Nurse Practitioner

## 2023-01-02 ENCOUNTER — Other Ambulatory Visit: Payer: Self-pay

## 2023-01-02 ENCOUNTER — Other Ambulatory Visit: Payer: 59

## 2023-01-02 ENCOUNTER — Ambulatory Visit: Payer: 59

## 2023-01-02 DIAGNOSIS — D649 Anemia, unspecified: Secondary | ICD-10-CM

## 2023-01-02 DIAGNOSIS — K6389 Other specified diseases of intestine: Secondary | ICD-10-CM

## 2023-01-02 DIAGNOSIS — C186 Malignant neoplasm of descending colon: Secondary | ICD-10-CM

## 2023-01-03 ENCOUNTER — Inpatient Hospital Stay: Payer: 59

## 2023-01-03 ENCOUNTER — Other Ambulatory Visit: Payer: Self-pay

## 2023-01-03 ENCOUNTER — Encounter: Payer: Self-pay | Admitting: *Deleted

## 2023-01-03 VITALS — BP 151/81 | HR 71 | Temp 98.2°F | Resp 18

## 2023-01-03 DIAGNOSIS — Z5111 Encounter for antineoplastic chemotherapy: Secondary | ICD-10-CM | POA: Diagnosis not present

## 2023-01-03 DIAGNOSIS — C186 Malignant neoplasm of descending colon: Secondary | ICD-10-CM

## 2023-01-03 LAB — TYPE AND SCREEN

## 2023-01-03 MED ORDER — HEPARIN SOD (PORK) LOCK FLUSH 100 UNIT/ML IV SOLN
500.0000 [IU] | Freq: Once | INTRAVENOUS | Status: AC | PRN
  Administered 2023-01-03: 500 [IU]

## 2023-01-03 MED ORDER — SODIUM CHLORIDE 0.9% FLUSH
10.0000 mL | INTRAVENOUS | Status: DC | PRN
  Administered 2023-01-03: 10 mL

## 2023-01-04 LAB — PREPARE RBC (CROSSMATCH)

## 2023-01-05 ENCOUNTER — Inpatient Hospital Stay: Payer: 59

## 2023-01-05 DIAGNOSIS — C186 Malignant neoplasm of descending colon: Secondary | ICD-10-CM

## 2023-01-05 DIAGNOSIS — Z5111 Encounter for antineoplastic chemotherapy: Secondary | ICD-10-CM | POA: Diagnosis not present

## 2023-01-05 MED ORDER — SODIUM CHLORIDE 0.9% FLUSH: 10.0000 mL | INTRAVENOUS | Status: DC | PRN

## 2023-01-05 MED ORDER — HEPARIN SOD (PORK) LOCK FLUSH 100 UNIT/ML IV SOLN: 500.0000 [IU] | Freq: Every day | INTRAVENOUS | Status: DC | PRN

## 2023-01-05 MED ORDER — SODIUM CHLORIDE 0.9% IV SOLUTION: 250.0000 mL | Freq: Once | INTRAVENOUS | Status: DC

## 2023-01-05 NOTE — Patient Instructions (Signed)

## 2023-01-06 LAB — TYPE AND SCREEN
ABO/RH(D): A POS
Antibody Screen: NEGATIVE
Unit division: 0

## 2023-01-06 LAB — BPAM RBC
Blood Product Expiration Date: 202410282359
ISSUE DATE / TIME: 202409280820
Unit Type and Rh: 202410282359
Unit Type and Rh: 6200

## 2023-01-09 ENCOUNTER — Other Ambulatory Visit: Payer: Self-pay

## 2023-01-09 NOTE — Progress Notes (Signed)
The proposed treatment discussed in conference is for discussion purpose only and is not a binding recommendation.  The patients have not been physically examined, or presented with their treatment options.  Therefore, final treatment plans cannot be decided.  

## 2023-01-11 ENCOUNTER — Other Ambulatory Visit: Payer: Self-pay

## 2023-01-14 MED FILL — Dexamethasone Sodium Phosphate Inj 100 MG/10ML: INTRAMUSCULAR | Qty: 1 | Status: AC

## 2023-01-14 NOTE — Assessment & Plan Note (Signed)
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  The left descending colon cancer was only 4 mm, removed by polypectomy. The largest 3 cm, fungating mass in the rectosigmoid junction is 15 cm from anal verge on colonoscopy, biopsy confirmed moderate differentiated adenocarcinoma -I reviewed her staging CT chest from November 18, 2022, which showed no definitive evidence of metastasis, but showed borderline enlarged thoracic adenopathy.  I will obtain a PET scan for further evaluation. -Her liver MRI unfortunately showed a 1.2 cm mass in the posterior dome of liver, segment 7, biopsy on 12/03/22 confirmed metastasis from colon cancer, I reviewed with her.  -Given her metastatic disease, I recommend chemotherapy first.  She started FOLFOX on 12/18/2022 -She has been seen by colorectal surgeon Dr. Maisie Fus -plan to reevaluated after 3 months of chemo, and discuss surgical resection of the primary tumor and liver metastasis. -She is not a candidate for EGFR inhibitor, will hold beva for now due to pending surgeries.

## 2023-01-15 ENCOUNTER — Inpatient Hospital Stay: Payer: 59

## 2023-01-15 ENCOUNTER — Inpatient Hospital Stay (HOSPITAL_BASED_OUTPATIENT_CLINIC_OR_DEPARTMENT_OTHER): Payer: 59 | Admitting: Hematology

## 2023-01-15 ENCOUNTER — Encounter: Payer: Self-pay | Admitting: Hematology

## 2023-01-15 ENCOUNTER — Inpatient Hospital Stay: Payer: 59 | Attending: Hematology

## 2023-01-15 VITALS — BP 147/74 | HR 90 | Temp 98.9°F | Resp 18

## 2023-01-15 VITALS — BP 140/84 | HR 89 | Temp 98.5°F | Resp 16 | Ht 65.0 in | Wt 271.9 lb

## 2023-01-15 DIAGNOSIS — C787 Secondary malignant neoplasm of liver and intrahepatic bile duct: Secondary | ICD-10-CM | POA: Diagnosis not present

## 2023-01-15 DIAGNOSIS — Z5111 Encounter for antineoplastic chemotherapy: Secondary | ICD-10-CM | POA: Diagnosis present

## 2023-01-15 DIAGNOSIS — Z95828 Presence of other vascular implants and grafts: Secondary | ICD-10-CM

## 2023-01-15 DIAGNOSIS — C186 Malignant neoplasm of descending colon: Secondary | ICD-10-CM | POA: Diagnosis not present

## 2023-01-15 DIAGNOSIS — D649 Anemia, unspecified: Secondary | ICD-10-CM

## 2023-01-15 DIAGNOSIS — D500 Iron deficiency anemia secondary to blood loss (chronic): Secondary | ICD-10-CM

## 2023-01-15 DIAGNOSIS — C19 Malignant neoplasm of rectosigmoid junction: Secondary | ICD-10-CM | POA: Insufficient documentation

## 2023-01-15 DIAGNOSIS — D509 Iron deficiency anemia, unspecified: Secondary | ICD-10-CM | POA: Insufficient documentation

## 2023-01-15 LAB — CMP (CANCER CENTER ONLY)
ALT: 16 U/L (ref 0–44)
AST: 15 U/L (ref 15–41)
Albumin: 3.9 g/dL (ref 3.5–5.0)
Alkaline Phosphatase: 57 U/L (ref 38–126)
Anion gap: 11 (ref 5–15)
BUN: 18 mg/dL (ref 6–20)
CO2: 24 mmol/L (ref 22–32)
Calcium: 9.7 mg/dL (ref 8.9–10.3)
Chloride: 103 mmol/L (ref 98–111)
Creatinine: 0.73 mg/dL (ref 0.44–1.00)
GFR, Estimated: >60 mL/min (ref >60)
Glucose, Bld: 100 mg/dL — ABNORMAL HIGH (ref 70–99)
Potassium: 3.9 mmol/L (ref 3.5–5.1)
Sodium: 138 mmol/L (ref 135–145)
Total Bilirubin: 0.2 mg/dL — ABNORMAL LOW (ref 0.3–1.2)
Total Protein: 7.6 g/dL (ref 6.5–8.1)

## 2023-01-15 LAB — CBC WITH DIFFERENTIAL (CANCER CENTER ONLY)
Abs Immature Granulocytes: 0.02 10*3/uL (ref 0.00–0.07)
Basophils Absolute: 0.1 10*3/uL (ref 0.0–0.1)
Basophils Relative: 1 %
Eosinophils Absolute: 0.1 10*3/uL (ref 0.0–0.5)
Eosinophils Relative: 2 %
HCT: 32.3 % — ABNORMAL LOW (ref 36.0–46.0)
Hemoglobin: 10.1 g/dL — ABNORMAL LOW (ref 12.0–15.0)
Immature Granulocytes: 0 %
Lymphocytes Relative: 38 %
Lymphs Abs: 2.7 10*3/uL (ref 0.7–4.0)
MCH: 21.5 pg — ABNORMAL LOW (ref 26.0–34.0)
MCHC: 31.3 g/dL (ref 30.0–36.0)
MCV: 68.7 fL — ABNORMAL LOW (ref 80.0–100.0)
Monocytes Absolute: 1 10*3/uL (ref 0.1–1.0)
Monocytes Relative: 14 %
Neutro Abs: 3.2 10*3/uL (ref 1.7–7.7)
Neutrophils Relative %: 45 %
Platelet Count: 386 10*3/uL (ref 150–400)
RBC: 4.7 MIL/uL (ref 3.87–5.11)
RDW: 26.5 % — ABNORMAL HIGH (ref 11.5–15.5)
WBC Count: 7.2 10*3/uL (ref 4.0–10.5)
nRBC: 0 % (ref 0.0–0.2)

## 2023-01-15 LAB — FERRITIN: Ferritin: 86 ng/mL (ref 11–307)

## 2023-01-15 MED ORDER — SODIUM CHLORIDE 0.9% FLUSH
10.0000 mL | Freq: Once | INTRAVENOUS | Status: AC
  Administered 2023-01-15: 10 mL

## 2023-01-15 MED ORDER — SODIUM CHLORIDE 0.9 % IV SOLN
2400.0000 mg/m2 | INTRAVENOUS | Status: DC
  Administered 2023-01-15: 5700 mg via INTRAVENOUS
  Filled 2023-01-15: qty 114

## 2023-01-15 MED ORDER — HEPARIN SOD (PORK) LOCK FLUSH 100 UNIT/ML IV SOLN: 500.0000 [IU] | Freq: Once | INTRAVENOUS | Status: DC | PRN

## 2023-01-15 MED ORDER — OXALIPLATIN CHEMO INJECTION 100 MG/20ML
85.0000 mg/m2 | Freq: Once | INTRAVENOUS | Status: AC
  Administered 2023-01-15: 200 mg via INTRAVENOUS
  Filled 2023-01-15: qty 40

## 2023-01-15 MED ORDER — SODIUM CHLORIDE 0.9 % IV SOLN
10.0000 mg | Freq: Once | INTRAVENOUS | Status: AC
  Administered 2023-01-15: 10 mg via INTRAVENOUS
  Filled 2023-01-15: qty 10

## 2023-01-15 MED ORDER — SODIUM CHLORIDE 0.9% FLUSH: 10.0000 mL | INTRAVENOUS | Status: DC | PRN

## 2023-01-15 MED ORDER — DEXTROSE 5 % IV SOLN: Freq: Once | INTRAVENOUS | Status: AC

## 2023-01-15 MED ORDER — SODIUM CHLORIDE 0.9 % IV SOLN
300.0000 mg | Freq: Once | INTRAVENOUS | Status: AC
  Administered 2023-01-15: 300 mg via INTRAVENOUS
  Filled 2023-01-15: qty 300

## 2023-01-15 MED ORDER — LEUCOVORIN CALCIUM INJECTION 350 MG
400.0000 mg/m2 | Freq: Once | INTRAVENOUS | Status: AC
  Administered 2023-01-15: 948 mg via INTRAVENOUS
  Filled 2023-01-15: qty 47.4

## 2023-01-15 MED ORDER — SODIUM CHLORIDE 0.9 % IV SOLN: Freq: Once | INTRAVENOUS | Status: AC

## 2023-01-15 MED ORDER — PALONOSETRON HCL INJECTION 0.25 MG/5ML
0.2500 mg | Freq: Once | INTRAVENOUS | Status: AC
  Administered 2023-01-15: 0.25 mg via INTRAVENOUS
  Filled 2023-01-15: qty 5

## 2023-01-15 MED ORDER — LORATADINE 10 MG PO TABS
10.0000 mg | ORAL_TABLET | Freq: Once | ORAL | Status: AC
  Administered 2023-01-15: 10 mg via ORAL
  Filled 2023-01-15: qty 1

## 2023-01-15 MED ORDER — FLUOROURACIL CHEMO INJECTION 2.5 GM/50ML
400.0000 mg/m2 | Freq: Once | INTRAVENOUS | Status: AC
  Administered 2023-01-15: 1000 mg via INTRAVENOUS
  Filled 2023-01-15: qty 20

## 2023-01-15 NOTE — Progress Notes (Signed)
[facility]   Telephone:([phone] Fax:([phone]) [phone]   Clinic Follow up Note   Patient Care Team: Early, Sung Amabile, NP as PCP - General (Nurse Practitioner) Malachy Mood, MD as Consulting Physician (Hematology and Oncology) Romie Levee, MD as Consulting Physician (General Surgery) Lynann Bologna, DO as Consulting Physician (Gastroenterology)  Date of Service:  01/15/2023  CHIEF COMPLAINT: f/u of metastatic colon cancer  CURRENT THERAPY:  Neoadjuvant chemotherapy FOLFOX every 2 weeks  Oncology History   Cancer of left colon (HCC) --cTxN0M1, stage IV with oligo liver metastasis, MMR normal, 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  The left descending colon cancer was only 4 mm, removed by polypectomy. The largest 3 cm, fungating mass in the rectosigmoid junction is 15 cm from anal verge on colonoscopy, biopsy confirmed moderate differentiated adenocarcinoma -I reviewed her staging CT chest from November 18, 2022, which showed no definitive evidence of metastasis, but showed borderline enlarged thoracic adenopathy.  I will obtain a PET scan for further evaluation. -Her liver MRI unfortunately showed a 1.2 cm mass in the posterior dome of liver, segment 7, biopsy on 12/03/22 confirmed metastasis from colon cancer, I reviewed with her.  -Given her metastatic disease, I recommend chemotherapy first.  She started FOLFOX on 12/18/2022 -She has been seen by colorectal surgeon Dr. Maisie Fus -plan to reevaluated after 3 months of chemo, and discuss surgical resection of the primary tumor and liver metastasis. -She is not a candidate for EGFR inhibitor, will hold beva for now due to pending surgeries.      Assessment and Plan    Metastatic Colon Cancer Currently undergoing chemotherapy with tolerable side effects. Noted a new dry skin patch, but no other rashes. No nausea, manageable fatigue, and stable weight. Bowel movements normal.  -I reviewed her  next January sequencing [facility] result, KRAS mutation identified in tumor, but specific mutation (G12S) does not have targeted therapy available. Bevacizumab is an option, but not recommended at this time due to upcoming surgery. Immunotherapy also not an option based on tumor mutation burden and MSI stability. -Continue current chemotherapy regimen. -Order scan after cycle 6 of chemotherapy (week after February 27, 2023). -Refer to liver surgeon (Dr. Freida Busman or Dr. Ebbie Ridge) for consultation.  Iron Deficiency Anemia Responding well to IV iron treatment. Hemoglobin improved from 7.8 to 10. -Continue IV iron treatment as needed based on iron levels.  Surgical Planning Patient has family help available in December. Considering timing of chemotherapy cycles, surgery likely to be scheduled in December or early January. -Discuss surgical scheduling with liver surgeon during consultation.      Plan -Lab reviewed, adequate for treatment, will proceed cycle 3 chemo today -Referral to Dr. Freida Busman or Dr. Donell Beers  -Follow-up in 2 weeks before cycle 4 chemo   SUMMARY OF ONCOLOGIC HISTORY: Oncology History  Cancer of left colon (HCC)  11/21/2022 Initial Diagnosis   Cancer of left colon (HCC)   12/18/2022 -  Chemotherapy   Patient is on Treatment Plan : COLORECTAL FOLFOX q14d     12/26/2022 PET scan   NM PET skull base to thigh  IMPRESSION: 1. Two hypermetabolic liver lesions consistent with metastatic disease. 2. Small hypermetabolic celiac axis and sigmoid mesocolon nodes suggesting metastatic adenopathy. 3. No findings for metastatic disease involving the chest or bony structures. 4. Diffuse hypermetabolism throughout the colon possibly related to recently eating or taking insulin making it difficult to identified the patient's sigmoid cancer.      Discussed  the use of AI scribe software for clinical note transcription with the patient, who gave verbal consent to proceed.  History of Present  Illness   The patient, a 57 year old female with metastatic colon cancer, presents for a follow-up visit. She reports a new rash on her arm that developed after her last chemotherapy treatment. The rash is dry and rough but does not itch or cause discomfort. She denies any other rashes. She has been tolerating chemotherapy well with minimal nausea and fatigue that resolves after a couple of days. She reports difficulty sleeping but is able to eat well and maintain her weight. Her bowel movements are regular without constipation or diarrhea. She is also scheduled for surgery for her colon cancer and is in the process of coordinating care with her family who will be available to help her in December. She has not yet seen the liver surgeon for the metastatic disease in her liver.         All other systems were reviewed with the patient and are negative.  MEDICAL HISTORY:  Past Medical History:  Diagnosis Date   Diabetes mellitus without complication (HCC)     SURGICAL HISTORY: Past Surgical History:  Procedure Laterality Date   CESAREAN SECTION     CHOLECYSTECTOMY     IR IMAGING GUIDED PORT INSERTION  12/03/2022   IR US LIVER BIOPSY  12/03/2022    I have reviewed the social history and family history with the patient and they are unchanged from previous note.  ALLERGIES:  has No Known Allergies.  MEDICATIONS:  Current Outpatient Medications  Medication Sig Dispense Refill   atorvastatin (LIPITOR) 20 MG tablet Take 20 mg by mouth daily.     azelastine (OPTIVAR) 0.05 % ophthalmic solution Place 1 drop into both eyes 2 (two) times daily. 6 mL 12   dexamethasone (DECADRON) 4 MG tablet Take 2 tablets (8 mg total) by mouth daily. Start the day after chemotherapy for 2 days. Take with food. 30 tablet 1   Iron, Ferrous Sulfate, 325 (65 Fe) MG TABS Take 325 mg by mouth daily. 30 tablet 11   lidocaine-prilocaine (EMLA) cream Apply to affected area once 30 g 3   nystatin (MYCOSTATIN/NYSTOP) powder  Apply 1 application to skin folds 3 (three) times daily as needed 60 g 2   ondansetron (ZOFRAN) 8 MG tablet Take 1 tablet (8 mg total) by mouth every 8 (eight) hours as needed for nausea or vomiting. Start on the third day after chemotherapy. 30 tablet 1   potassium chloride SA (KLOR-CON M) 20 MEQ tablet Take 1 tablet (20 mEq total) by mouth 2 (two) times daily. Take 1 tablet 3 times daily for 3 days, then twice daily 60 tablet 0   prochlorperazine (COMPAZINE) 10 MG tablet Take 1 tablet (10 mg total) by mouth every 6 (six) hours as needed for nausea or vomiting. 30 tablet 1   No current facility-administered medications for this visit.    PHYSICAL EXAMINATION: ECOG PERFORMANCE STATUS: 1 - Symptomatic but completely ambulatory  Vitals:   01/15/23 0841  BP: (!) 140/84  Pulse: 89  Resp: 16  Temp: 98.5 F (36.9 C)  SpO2: 100%   Wt Readings from Last 3 Encounters:  01/15/23 271 lb 14.4 oz (123.3 kg)  01/01/23 275 lb 5 oz (124.9 kg)  12/18/22 272 lb 4 oz (123.5 kg)     GENERAL:alert, no distress and comfortable SKIN: skin color, texture, turgor are normal, no rashes or significant lesions EYES: normal, Conjunctiva  are pink and non-injected, sclera clear NECK: supple, thyroid normal size, non-tender, without nodularity LYMPH:  no palpable lymphadenopathy in the cervical, axillary  LUNGS: clear to auscultation and percussion with normal breathing effort HEART: regular rate & rhythm and no murmurs and no lower extremity edema ABDOMEN:abdomen soft, non-tender and normal bowel sounds Musculoskeletal:no cyanosis of digits and no clubbing  NEURO: alert & oriented x 3 with fluent speech, no focal motor/sensory deficits   LABORATORY DATA:  I have reviewed the data as listed    Latest Ref Rng & Units 01/15/2023    8:25 AM 01/01/2023    8:24 AM 12/18/2022    9:29 AM  CBC  WBC 4.0 - 10.5 K/uL 7.2  5.7  8.5   Hemoglobin 12.0 - 15.0 g/dL 09.8  7.8  8.4   Hematocrit 36.0 - 46.0 % 32.3  25.9   28.5   Platelets 150 - 400 K/uL 386  478  539         Latest Ref Rng & Units 01/01/2023    8:24 AM 12/18/2022    9:29 AM 12/12/2022    2:40 PM  CMP  Glucose 70 - 99 mg/dL 119  147  829   BUN 6 - 20 mg/dL 7  8  16    Creatinine 0.44 - 1.00 mg/dL 5.62  1.30  8.65   Sodium 135 - 145 mmol/L 141  139  135   Potassium 3.5 - 5.1 mmol/L 3.0  3.5  3.5   Chloride 98 - 111 mmol/L 104  102  98   CO2 22 - 32 mmol/L 28  27  28    Calcium 8.9 - 10.3 mg/dL 8.4  9.0  9.8   Total Protein 6.5 - 8.1 g/dL 6.8  7.8  8.2   Total Bilirubin 0.3 - 1.2 mg/dL 0.3  0.3  0.4   Alkaline Phos 38 - 126 U/L 58  66  83   AST 15 - 41 U/L 12  12  12    ALT 0 - 44 U/L 10  10  13        RADIOGRAPHIC STUDIES: I have personally reviewed the radiological images as listed and agreed with the findings in the report. No results found.    Orders Placed This Encounter  Procedures   CEA (Access)-[facility] ONLY    Standing Status:   Standing    Number of Occurrences:   30    Standing Expiration Date:   01/15/2024   CBC with Differential (Cancer Center Only)    Standing Status:   Future    Standing Expiration Date:   02/27/2024   CMP (Cancer Center only)    Standing Status:   Future    Standing Expiration Date:   02/27/2024   Ambulatory referral to General Surgery    Referral Priority:   Routine    Referral Type:   Surgical    Referral Reason:   Specialty Services Required    Requested Specialty:   General Surgery    Number of Visits Requested:   1   All questions were answered. The patient knows to call the clinic with any problems, questions or concerns. No barriers to learning was detected. The total time spent in the appointment was 30 minutes.     Malachy Mood, MD 01/15/2023

## 2023-01-15 NOTE — Patient Instructions (Addendum)
[place] [facility]  Discharge Instructions: Thank you for choosing [facility] to provide your oncology and hematology care.   If you have a lab appointment with the Cancer Center, please go directly to the Cancer Center and check in at the registration area.   Wear comfortable clothing and clothing appropriate for easy access to any Portacath or PICC line.   We strive to give you quality time with your provider. You may need to reschedule your appointment if you arrive late (15 or more minutes).  Arriving late affects you and other patients whose appointments are after yours.  Also, if you miss three or more appointments without notifying the office, you may be dismissed from the clinic at the provider's discretion.      For prescription refill requests, have your pharmacy contact our office and allow 72 hours for refills to be completed.    Today you received the following chemotherapy and/or immunotherapy agents: Oxaliplatin, Leucovorin and Fluorouracil.   To help prevent nausea and vomiting after your treatment, we encourage you to take your nausea medication as directed.  BELOW ARE SYMPTOMS THAT SHOULD BE REPORTED IMMEDIATELY: *FEVER GREATER THAN 100.4 F (38 C) OR HIGHER *CHILLS OR SWEATING *NAUSEA AND VOMITING THAT IS NOT CONTROLLED WITH YOUR NAUSEA MEDICATION *UNUSUAL SHORTNESS OF BREATH *UNUSUAL BRUISING OR BLEEDING *URINARY PROBLEMS (pain or burning when urinating, or frequent urination) *BOWEL PROBLEMS (unusual diarrhea, constipation, pain near the anus) TENDERNESS IN MOUTH AND THROAT WITH OR WITHOUT PRESENCE OF ULCERS (sore throat, sores in mouth, or a toothache) UNUSUAL RASH, SWELLING OR PAIN  UNUSUAL VAGINAL DISCHARGE OR ITCHING   Items with * indicate a potential emergency and should be followed up as soon as possible or go to the Emergency Department if any problems should occur.  Please show the CHEMOTHERAPY ALERT CARD or  IMMUNOTHERAPY ALERT CARD at check-in to the Emergency Department and triage nurse.  Should you have questions after your visit or need to cancel or reschedule your appointment, please contact [place] [facility]  Dept: [phone]  and follow the prompts.  Office hours are 8:00 a.m. to 4:30 p.m. Monday - Friday. Please note that voicemails left after 4:00 p.m. may not be returned until the following business day.  We are closed weekends and major holidays. You have access to a nurse at all times for urgent questions. Please call the main number to the clinic Dept: [phone] and follow the prompts.   For any non-urgent questions, you may also contact your provider using MyChart. We now offer e-Visits for anyone 19 and older to request care online for non-urgent symptoms. For details visit [email or web address].   Also download the MyChart app! Go to the app store, search "MyChart", open the app, select [facility], and log in with your MyChart username and password. The chemotherapy medication bag should finish at 46 hours, 96 hours, or 7 days. For example, if your pump is scheduled for 46 hours and it was put on at 4:00 p.m., it should finish at 2:00 p.m. the day it is scheduled to come off regardless of your appointment time.     Estimated time to finish at: 1:00 PM   If the display on your pump reads "Low Volume" and it is beeping, take the batteries out of the pump and come to the cancer center for it to be taken off.   If the pump alarms go off prior to the  pump reading "Low Volume" then call [phone] and someone can assist you.  If the plunger comes out and the chemotherapy medication is leaking out, please use your home chemo spill kit to clean up the spill. Do NOT use paper towels or other household products.  If you have problems or questions regarding your pump, please call either [phone] (24 hours a day) or the cancer center Monday-Friday  8:00 a.m.- 4:30 p.m. at the clinic number and we will assist you. If you are unable to get assistance, then go to the nearest Emergency Department and ask the staff to contact the IV team for assistance.   Iron Sucrose Injection What is this medication? IRON SUCROSE (EYE ern SOO krose) treats low levels of iron (iron deficiency anemia) in people with kidney disease. Iron is a mineral that plays an important role in making red blood cells, which carry oxygen from your lungs to the rest of your body. This medicine may be used for other purposes; ask your health care provider or pharmacist if you have questions. COMMON BRAND NAME(S): Venofer What should I tell my care team before I take this medication? They need to know if you have any of these conditions: Anemia not caused by low iron levels Heart disease High levels of iron in the blood Kidney disease Liver disease An unusual or allergic reaction to iron, other medications, foods, dyes, or preservatives Pregnant or trying to get pregnant Breastfeeding How should I use this medication? This medication is for infusion into a vein. It is given in a hospital or clinic setting. Talk to your care team about the use of this medication in children. While this medication may be prescribed for children as young as 2 years for selected conditions, precautions do apply. Overdosage: If you think you have taken too much of this medicine contact a poison control center or emergency room at once. NOTE: This medicine is only for you. Do not share this medicine with others. What if I miss a dose? Keep appointments for follow-up doses. It is important not to miss your dose. Call your care team if you are unable to keep an appointment. What may interact with this medication? Do not take this medication with any of the following: Deferoxamine Dimercaprol Other iron products This medication may also interact with the  following: Chloramphenicol Deferasirox This list may not describe all possible interactions. Give your health care provider a list of all the medicines, herbs, non-prescription drugs, or dietary supplements you use. Also tell them if you smoke, drink alcohol, or use illegal drugs. Some items may interact with your medicine. What should I watch for while using this medication? Visit your care team regularly. Tell your care team if your symptoms do not start to get better or if they get worse. You may need blood work done while you are taking this medication. You may need to follow a special diet. Talk to your care team. Foods that contain iron include: whole grains/cereals, dried fruits, beans, or peas, leafy green vegetables, and organ meats (liver, kidney). What side effects may I notice from receiving this medication? Side effects that you should report to your care team as soon as possible: Allergic reactions--skin rash, itching, hives, swelling of the face, lips, tongue, or throat Low blood pressure--dizziness, feeling faint or lightheaded, blurry vision Shortness of breath Side effects that usually do not require medical attention (report to your care team if they continue or are bothersome): Flushing Headache Joint pain Muscle pain  Nausea Pain, redness, or irritation at injection site This list may not describe all possible side effects. Call your doctor for medical advice about side effects. You may report side effects to FDA at 1-800-FDA-1088. Where should I keep my medication? This medication is given in a hospital or clinic. It will not be stored at home. NOTE: This sheet is a summary. It may not cover all possible information. If you have questions about this medicine, talk to your doctor, pharmacist, or health care provider.  2024 Elsevier/Gold Standard (2022-08-31 00:00:00)

## 2023-01-16 ENCOUNTER — Ambulatory Visit: Payer: 59

## 2023-01-16 ENCOUNTER — Ambulatory Visit: Payer: 59 | Admitting: Nurse Practitioner

## 2023-01-16 ENCOUNTER — Ambulatory Visit: Payer: 59 | Admitting: Hematology

## 2023-01-16 ENCOUNTER — Other Ambulatory Visit: Payer: 59

## 2023-01-16 ENCOUNTER — Encounter: Payer: Self-pay | Admitting: Hematology

## 2023-01-16 ENCOUNTER — Other Ambulatory Visit: Payer: Self-pay

## 2023-01-17 ENCOUNTER — Inpatient Hospital Stay: Payer: 59

## 2023-01-17 VITALS — BP 149/74 | HR 71 | Temp 98.1°F | Resp 17

## 2023-01-17 DIAGNOSIS — Z5111 Encounter for antineoplastic chemotherapy: Secondary | ICD-10-CM | POA: Diagnosis not present

## 2023-01-17 DIAGNOSIS — C186 Malignant neoplasm of descending colon: Secondary | ICD-10-CM

## 2023-01-17 MED ORDER — HEPARIN SOD (PORK) LOCK FLUSH 100 UNIT/ML IV SOLN
500.0000 [IU] | Freq: Once | INTRAVENOUS | Status: AC | PRN
  Administered 2023-01-17: 500 [IU]

## 2023-01-17 MED ORDER — SODIUM CHLORIDE 0.9% FLUSH
10.0000 mL | INTRAVENOUS | Status: DC | PRN
  Administered 2023-01-17: 10 mL

## 2023-01-28 NOTE — Assessment & Plan Note (Signed)
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  The left descending colon cancer was only 4 mm, removed by polypectomy. The largest 3 cm, fungating mass in the rectosigmoid junction is 15 cm from anal verge on colonoscopy, biopsy confirmed moderate differentiated adenocarcinoma -I reviewed her staging CT chest from November 18, 2022, which showed no definitive evidence of metastasis, but showed borderline enlarged thoracic adenopathy.  I will obtain a PET scan for further evaluation. -Her liver MRI unfortunately showed a 1.2 cm mass in the posterior dome of liver, segment 7, biopsy on 12/03/22 confirmed metastasis from colon cancer, I reviewed with her.  -Given her metastatic disease, I recommend chemotherapy first.  She started FOLFOX on 12/18/2022 -She has been seen by colorectal surgeon Dr. Maisie Fus -plan to reevaluated after 3 months of chemo, and discuss surgical resection of the primary tumor and liver metastasis. -She is not a candidate for EGFR inhibitor, will hold beva for now due to pending surgeries.

## 2023-01-29 ENCOUNTER — Inpatient Hospital Stay: Payer: 59

## 2023-01-29 ENCOUNTER — Encounter: Payer: Self-pay | Admitting: Hematology

## 2023-01-29 ENCOUNTER — Inpatient Hospital Stay (HOSPITAL_BASED_OUTPATIENT_CLINIC_OR_DEPARTMENT_OTHER): Payer: 59 | Admitting: Hematology

## 2023-01-29 VITALS — BP 138/61 | HR 86 | Temp 98.8°F | Resp 16 | Ht 65.0 in | Wt 270.5 lb

## 2023-01-29 DIAGNOSIS — Z95828 Presence of other vascular implants and grafts: Secondary | ICD-10-CM

## 2023-01-29 DIAGNOSIS — C186 Malignant neoplasm of descending colon: Secondary | ICD-10-CM

## 2023-01-29 DIAGNOSIS — D649 Anemia, unspecified: Secondary | ICD-10-CM

## 2023-01-29 DIAGNOSIS — Z5111 Encounter for antineoplastic chemotherapy: Secondary | ICD-10-CM | POA: Diagnosis not present

## 2023-01-29 LAB — CMP (CANCER CENTER ONLY)
ALT: 23 U/L (ref 0–44)
AST: 23 U/L (ref 15–41)
Albumin: 3.9 g/dL (ref 3.5–5.0)
Alkaline Phosphatase: 63 U/L (ref 38–126)
Anion gap: 9 (ref 5–15)
BUN: 8 mg/dL (ref 6–20)
CO2: 26 mmol/L (ref 22–32)
Calcium: 9.6 mg/dL (ref 8.9–10.3)
Chloride: 103 mmol/L (ref 98–111)
Creatinine: 0.72 mg/dL (ref 0.44–1.00)
GFR, Estimated: >60 mL/min (ref >60)
Glucose, Bld: 119 mg/dL — ABNORMAL HIGH (ref 70–99)
Potassium: 3.9 mmol/L (ref 3.5–5.1)
Sodium: 138 mmol/L (ref 135–145)
Total Bilirubin: 0.3 mg/dL (ref 0.3–1.2)
Total Protein: 7.4 g/dL (ref 6.5–8.1)

## 2023-01-29 LAB — CBC WITH DIFFERENTIAL (CANCER CENTER ONLY)
Abs Immature Granulocytes: 0.02 10*3/uL (ref 0.00–0.07)
Basophils Absolute: 0.1 10*3/uL (ref 0.0–0.1)
Basophils Relative: 1 %
Eosinophils Absolute: 0.1 10*3/uL (ref 0.0–0.5)
Eosinophils Relative: 1 %
HCT: 31.6 % — ABNORMAL LOW (ref 36.0–46.0)
Hemoglobin: 9.8 g/dL — ABNORMAL LOW (ref 12.0–15.0)
Immature Granulocytes: 0 %
Lymphocytes Relative: 34 %
Lymphs Abs: 2.5 10*3/uL (ref 0.7–4.0)
MCH: 21.6 pg — ABNORMAL LOW (ref 26.0–34.0)
MCHC: 31 g/dL (ref 30.0–36.0)
MCV: 69.8 fL — ABNORMAL LOW (ref 80.0–100.0)
Monocytes Absolute: 0.9 10*3/uL (ref 0.1–1.0)
Monocytes Relative: 13 %
Neutro Abs: 3.8 10*3/uL (ref 1.7–7.7)
Neutrophils Relative %: 51 %
Platelet Count: 284 10*3/uL (ref 150–400)
RBC: 4.53 MIL/uL (ref 3.87–5.11)
RDW: 26.9 % — ABNORMAL HIGH (ref 11.5–15.5)
Smear Review: NORMAL
WBC Count: 7.5 10*3/uL (ref 4.0–10.5)
nRBC: 0 % (ref 0.0–0.2)

## 2023-01-29 LAB — FERRITIN: Ferritin: 263 ng/mL (ref 11–307)

## 2023-01-29 LAB — CEA (ACCESS): CEA (CHCC): 6.01 ng/mL — ABNORMAL HIGH (ref 0.00–5.00)

## 2023-01-29 MED ORDER — DEXAMETHASONE SODIUM PHOSPHATE 10 MG/ML IJ SOLN
10.0000 mg | Freq: Once | INTRAMUSCULAR | Status: AC
  Administered 2023-01-29: 10 mg via INTRAVENOUS
  Filled 2023-01-29: qty 1

## 2023-01-29 MED ORDER — OXALIPLATIN CHEMO INJECTION 100 MG/20ML
85.0000 mg/m2 | Freq: Once | INTRAVENOUS | Status: AC
  Administered 2023-01-29: 200 mg via INTRAVENOUS
  Filled 2023-01-29: qty 40

## 2023-01-29 MED ORDER — POTASSIUM CHLORIDE CRYS ER 20 MEQ PO TBCR: 20.0000 meq | EXTENDED_RELEASE_TABLET | Freq: Two times a day (BID) | ORAL | 1 refills | Status: DC

## 2023-01-29 MED ORDER — DEXTROSE 5 % IV SOLN: Freq: Once | INTRAVENOUS | Status: AC

## 2023-01-29 MED ORDER — DEXTROSE 5 % IV SOLN
400.0000 mg/m2 | Freq: Once | INTRAVENOUS | Status: AC
  Administered 2023-01-29: 948 mg via INTRAVENOUS
  Filled 2023-01-29: qty 47.4

## 2023-01-29 MED ORDER — SODIUM CHLORIDE 0.9% FLUSH
10.0000 mL | Freq: Once | INTRAVENOUS | Status: AC
Start: 2023-01-29 — End: 2023-01-29
  Administered 2023-01-29: 10 mL

## 2023-01-29 MED ORDER — FLUOROURACIL CHEMO INJECTION 2.5 GM/50ML
400.0000 mg/m2 | Freq: Once | INTRAVENOUS | Status: AC
Start: 2023-01-29 — End: 2023-01-29
  Administered 2023-01-29: 1000 mg via INTRAVENOUS
  Filled 2023-01-29: qty 20

## 2023-01-29 MED ORDER — PALONOSETRON HCL INJECTION 0.25 MG/5ML
0.2500 mg | Freq: Once | INTRAVENOUS | Status: AC
Start: 2023-01-29 — End: 2023-01-29
  Administered 2023-01-29: 0.25 mg via INTRAVENOUS
  Filled 2023-01-29: qty 5

## 2023-01-29 MED ORDER — SODIUM CHLORIDE 0.9 % IV SOLN
2400.0000 mg/m2 | INTRAVENOUS | Status: DC
  Administered 2023-01-29: 5700 mg via INTRAVENOUS
  Filled 2023-01-29: qty 114

## 2023-01-29 NOTE — Progress Notes (Signed)
[facility]   Telephone:([phone]) [phone] Fax:([phone]) [phone]   Clinic Follow up Note   Patient Care Team: Early, Sung Amabile, NP as PCP - General (Nurse Practitioner) Malachy Mood, MD as Consulting Physician (Hematology and Oncology) Romie Levee, MD as Consulting Physician (General Surgery) Lynann Bologna, DO as Consulting Physician (Gastroenterology)  Date of Service:  01/29/2023  CHIEF COMPLAINT: f/u of metastatic colon cancer  CURRENT THERAPY:  First-line chemotherapy FOLFOX   Oncology History   Cancer of left colon (HCC) --[id number], stage IV with oligo liver metastasis, MMR normal, 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  The left descending colon cancer was only 4 mm, removed by polypectomy. The largest 3 cm, fungating mass in the rectosigmoid junction is 15 cm from anal verge on colonoscopy, biopsy confirmed moderate differentiated adenocarcinoma -I reviewed her staging CT chest from November 18, 2022, which showed no definitive evidence of metastasis, but showed borderline enlarged thoracic adenopathy.  I will obtain a PET scan for further evaluation. -Her liver MRI unfortunately showed a 1.2 cm mass in the posterior dome of liver, segment 7, biopsy on 12/03/22 confirmed metastasis from colon cancer, I reviewed with her.  -Given her metastatic disease, I recommend chemotherapy first.  She started FOLFOX on 12/18/2022 -She has been seen by colorectal surgeon Dr. Maisie Fus -plan to reevaluated after 3 months of chemo, and discuss surgical resection of the primary tumor and liver metastasis. -She is not a candidate for EGFR inhibitor, will hold beva for now due to pending surgeries.     Assessment and Plan    Metastatic Colon Cancer Tolerating chemotherapy with 3-4 days of fatigue post-treatment. No significant nausea or vomiting. No interruption of work. Discussed adjusting treatment schedule to better align with patient's work schedule and  recovery period. -Adjust chemotherapy schedule to Thursday for next cycle on 02/14/2023. -Continue current chemotherapy regimen. -Contact liver surgeons Dr. Karsten Ro and Dr. Freida Busman for consultation regarding surgical planning.  Iron deficient anemia Improvement noted with IV iron. Pending iron level results to determine need for further supplementation. -Check iron levels and adjust IV iron supplementation as needed.  Hypokalemia Taking potassium supplementation twice daily. No diarrhea reported. Pending potassium level results to determine need for further supplementation. -Check potassium levels and adjust supplementation as needed. -Refill potassium prescription at [facility] on [address].  Plan -Lab reviewed, adequate for treatment, will proceed cycle 4 chemotherapy FOLFOX today at the same dose -Will reach out to Dr. Donell Beers and Dr. Freida Busman for her appointment with one of them -Follow-up in 2 weeks before chemotherapy     SUMMARY OF ONCOLOGIC HISTORY: Oncology History  Cancer of left colon (HCC)  11/21/2022 Initial Diagnosis   Cancer of left colon (HCC)   12/18/2022 -  Chemotherapy   Patient is on Treatment Plan : COLORECTAL FOLFOX q14d     12/26/2022 PET scan   NM PET skull base to thigh  IMPRESSION: 1. Two hypermetabolic liver lesions consistent with metastatic disease. 2. Small hypermetabolic celiac axis and sigmoid mesocolon nodes suggesting metastatic adenopathy. 3. No findings for metastatic disease involving the chest or bony structures. 4. Diffuse hypermetabolism throughout the colon possibly related to recently eating or taking insulin making it difficult to identified the patient's sigmoid cancer.      Discussed the use of AI scribe software for clinical note transcription with the patient, who gave verbal consent to proceed.  History of Present Illness   A 57 year old female with a history of metastatic  colon cancer presents for a follow-up visit.  She reports that her most recent round of chemotherapy left her feeling tired for three to four days, but she was still able to eat, drink, and move around. She did not take time off from work, but she did take two days off after her treatment. She reports feeling the worst on the day of pump DC, feeling exhausted once she gets home and the following day. She also reports a discoloration in her fingers, which she was unsure if it was a side effect of the chemotherapy. She denies any peeling. She reports no pain in the stomach, no fever, and no chills. She is currently taking potassium and needs a refill. She also mentions that she needs to call in a refill for her atorvastatin.         All other systems were reviewed with the patient and are negative.  MEDICAL HISTORY:  Past Medical History:  Diagnosis Date   Diabetes mellitus without complication (HCC)     SURGICAL HISTORY: Past Surgical History:  Procedure Laterality Date   CESAREAN SECTION     CHOLECYSTECTOMY     IR IMAGING GUIDED PORT INSERTION  12/03/2022   IR US LIVER BIOPSY  12/03/2022    I have reviewed the social history and family history with the patient and they are unchanged from previous note.  ALLERGIES:  has No Known Allergies.  MEDICATIONS:  Current Outpatient Medications  Medication Sig Dispense Refill   atorvastatin (LIPITOR) 20 MG tablet Take 20 mg by mouth daily.     azelastine (OPTIVAR) 0.05 % ophthalmic solution Place 1 drop into both eyes 2 (two) times daily. 6 mL 12   dexamethasone (DECADRON) 4 MG tablet Take 2 tablets (8 mg total) by mouth daily. Start the day after chemotherapy for 2 days. Take with food. 30 tablet 1   Iron, Ferrous Sulfate, 325 (65 Fe) MG TABS Take 325 mg by mouth daily. 30 tablet 11   lidocaine-prilocaine (EMLA) cream Apply to affected area once 30 g 3   nystatin (MYCOSTATIN/NYSTOP) powder Apply 1 application to skin folds 3 (three) times daily as needed 60 g 2   ondansetron (ZOFRAN) 8 MG  tablet Take 1 tablet (8 mg total) by mouth every 8 (eight) hours as needed for nausea or vomiting. Start on the third day after chemotherapy. 30 tablet 1   potassium chloride SA (KLOR-CON M) 20 MEQ tablet Take 1 tablet (20 mEq total) by mouth 2 (two) times daily. Take 1 tablet 3 times daily for 3 days, then twice daily 60 tablet 1   prochlorperazine (COMPAZINE) 10 MG tablet Take 1 tablet (10 mg total) by mouth every 6 (six) hours as needed for nausea or vomiting. 30 tablet 1   No current facility-administered medications for this visit.   Facility-Administered Medications Ordered in Other Visits  Medication Dose Route Frequency Provider Last Rate Last Admin   fluorouracil (ADRUCIL) 5,700 mg in sodium chloride 0.9 % 136 mL chemo infusion  2,400 mg/m2 (Treatment Plan Recorded) Intravenous 1 day or 1 dose Malachy Mood, MD   5,700 mg at 01/29/23 1325    PHYSICAL EXAMINATION: ECOG PERFORMANCE STATUS: 1 - Symptomatic but completely ambulatory  Vitals:   01/29/23 0945  BP: 138/61  Pulse: 86  Resp: 16  Temp: 98.8 F (37.1 C)  SpO2: 100%   Wt Readings from Last 3 Encounters:  01/29/23 270 lb 8 oz (122.7 kg)  01/15/23 271 lb 14.4 oz (123.3 kg)  01/01/23  275 lb 5 oz (124.9 kg)     GENERAL:alert, no distress and comfortable SKIN: skin color, texture, turgor are normal, no rashes or significant lesions EYES: normal, Conjunctiva are pink and non-injected, sclera clear NECK: supple, thyroid normal size, non-tender, without nodularity LYMPH:  no palpable lymphadenopathy in the cervical, axillary  LUNGS: clear to auscultation and percussion with normal breathing effort HEART: regular rate & rhythm and no murmurs and no lower extremity edema ABDOMEN:abdomen soft, non-tender and normal bowel sounds Musculoskeletal:no cyanosis of digits and no clubbing  NEURO: alert & oriented x 3 with fluent speech, no focal motor/sensory deficits   LABORATORY DATA:  I have reviewed the data as listed    Latest  Ref Rng & Units 01/29/2023    9:17 AM 01/15/2023    8:25 AM 01/01/2023    8:24 AM  CBC  WBC 4.0 - 10.5 K/uL 7.5  7.2  5.7   Hemoglobin 12.0 - 15.0 g/dL 9.8  82.9  7.8   Hematocrit 36.0 - 46.0 % 31.6  32.3  25.9   Platelets 150 - 400 K/uL 284  386  478         Latest Ref Rng & Units 01/29/2023    9:17 AM 01/15/2023    8:25 AM 01/01/2023    8:24 AM  CMP  Glucose 70 - 99 mg/dL 562  130  865   BUN 6 - 20 mg/dL 8  18  7    Creatinine 0.44 - 1.00 mg/dL 7.84  6.96  2.95   Sodium 135 - 145 mmol/L 138  138  141   Potassium 3.5 - 5.1 mmol/L 3.9  3.9  3.0   Chloride 98 - 111 mmol/L 103  103  104   CO2 22 - 32 mmol/L 26  24  28    Calcium 8.9 - 10.3 mg/dL 9.6  9.7  8.4   Total Protein 6.5 - 8.1 g/dL 7.4  7.6  6.8   Total Bilirubin 0.3 - 1.2 mg/dL 0.3  0.2  0.3   Alkaline Phos 38 - 126 U/L 63  57  58   AST 15 - 41 U/L 23  15  12    ALT 0 - 44 U/L 23  16  10        RADIOGRAPHIC STUDIES: I have personally reviewed the radiological images as listed and agreed with the findings in the report. No results found.    No orders of the defined types were placed in this encounter.  All questions were answered. The patient knows to call the clinic with any problems, questions or concerns. No barriers to learning was detected. The total time spent in the appointment was 25 minutes.     Malachy Mood, MD 01/29/2023

## 2023-01-30 ENCOUNTER — Other Ambulatory Visit: Payer: Self-pay

## 2023-01-30 ENCOUNTER — Ambulatory Visit: Payer: 59 | Admitting: Hematology

## 2023-01-30 ENCOUNTER — Other Ambulatory Visit: Payer: 59

## 2023-01-30 ENCOUNTER — Ambulatory Visit: Payer: 59

## 2023-01-31 ENCOUNTER — Inpatient Hospital Stay: Payer: 59

## 2023-01-31 ENCOUNTER — Encounter: Payer: Self-pay | Admitting: Emergency Medicine

## 2023-01-31 VITALS — BP 129/69 | HR 89 | Temp 98.5°F | Resp 18

## 2023-01-31 DIAGNOSIS — Z95828 Presence of other vascular implants and grafts: Secondary | ICD-10-CM

## 2023-01-31 DIAGNOSIS — C186 Malignant neoplasm of descending colon: Secondary | ICD-10-CM

## 2023-01-31 DIAGNOSIS — Z5111 Encounter for antineoplastic chemotherapy: Secondary | ICD-10-CM | POA: Diagnosis not present

## 2023-01-31 MED ORDER — HEPARIN SOD (PORK) LOCK FLUSH 100 UNIT/ML IV SOLN
500.0000 [IU] | Freq: Once | INTRAVENOUS | Status: AC
Start: 2023-01-31 — End: 2023-01-31
  Administered 2023-01-31: 500 [IU]

## 2023-01-31 MED ORDER — SODIUM CHLORIDE 0.9% FLUSH
10.0000 mL | Freq: Once | INTRAVENOUS | Status: AC
  Administered 2023-01-31: 10 mL

## 2023-02-01 ENCOUNTER — Encounter: Payer: Self-pay | Admitting: Hematology

## 2023-02-04 ENCOUNTER — Encounter: Payer: Self-pay | Admitting: Nurse Practitioner

## 2023-02-04 ENCOUNTER — Other Ambulatory Visit: Payer: Self-pay

## 2023-02-12 ENCOUNTER — Ambulatory Visit: Payer: 59

## 2023-02-12 ENCOUNTER — Other Ambulatory Visit: Payer: 59

## 2023-02-12 ENCOUNTER — Ambulatory Visit: Payer: 59 | Admitting: Hematology

## 2023-02-13 ENCOUNTER — Inpatient Hospital Stay: Payer: 59

## 2023-02-13 ENCOUNTER — Inpatient Hospital Stay (HOSPITAL_BASED_OUTPATIENT_CLINIC_OR_DEPARTMENT_OTHER): Payer: 59 | Admitting: Hematology

## 2023-02-13 ENCOUNTER — Other Ambulatory Visit: Payer: Self-pay

## 2023-02-13 ENCOUNTER — Inpatient Hospital Stay: Payer: 59 | Attending: Hematology

## 2023-02-13 VITALS — BP 140/70 | HR 87 | Temp 98.1°F | Resp 18 | Ht 65.0 in | Wt 279.4 lb

## 2023-02-13 VITALS — BP 137/77 | HR 79 | Temp 98.0°F | Resp 18

## 2023-02-13 DIAGNOSIS — Z5111 Encounter for antineoplastic chemotherapy: Secondary | ICD-10-CM | POA: Insufficient documentation

## 2023-02-13 DIAGNOSIS — C186 Malignant neoplasm of descending colon: Secondary | ICD-10-CM | POA: Insufficient documentation

## 2023-02-13 DIAGNOSIS — C19 Malignant neoplasm of rectosigmoid junction: Secondary | ICD-10-CM | POA: Diagnosis not present

## 2023-02-13 DIAGNOSIS — D500 Iron deficiency anemia secondary to blood loss (chronic): Secondary | ICD-10-CM

## 2023-02-13 DIAGNOSIS — C787 Secondary malignant neoplasm of liver and intrahepatic bile duct: Secondary | ICD-10-CM | POA: Insufficient documentation

## 2023-02-13 DIAGNOSIS — Z95828 Presence of other vascular implants and grafts: Secondary | ICD-10-CM

## 2023-02-13 LAB — CMP (CANCER CENTER ONLY)
ALT: 14 U/L (ref 0–44)
AST: 14 U/L — ABNORMAL LOW (ref 15–41)
Albumin: 3.7 g/dL (ref 3.5–5.0)
Alkaline Phosphatase: 68 U/L (ref 38–126)
Anion gap: 8 (ref 5–15)
BUN: 11 mg/dL (ref 6–20)
CO2: 27 mmol/L (ref 22–32)
Calcium: 9.2 mg/dL (ref 8.9–10.3)
Chloride: 104 mmol/L (ref 98–111)
Creatinine: 0.65 mg/dL (ref 0.44–1.00)
GFR, Estimated: >60 mL/min (ref >60)
Glucose, Bld: 102 mg/dL — ABNORMAL HIGH (ref 70–99)
Potassium: 4.1 mmol/L (ref 3.5–5.1)
Sodium: 139 mmol/L (ref 135–145)
Total Bilirubin: 0.3 mg/dL (ref <1.2)
Total Protein: 7.2 g/dL (ref 6.5–8.1)

## 2023-02-13 LAB — CBC WITH DIFFERENTIAL (CANCER CENTER ONLY)
Abs Immature Granulocytes: 0.01 10*3/uL (ref 0.00–0.07)
Basophils Absolute: 0 10*3/uL (ref 0.0–0.1)
Basophils Relative: 1 %
Eosinophils Absolute: 0.1 10*3/uL (ref 0.0–0.5)
Eosinophils Relative: 1 %
HCT: 29.5 % — ABNORMAL LOW (ref 36.0–46.0)
Hemoglobin: 9.4 g/dL — ABNORMAL LOW (ref 12.0–15.0)
Immature Granulocytes: 0 %
Lymphocytes Relative: 51 %
Lymphs Abs: 2.9 10*3/uL (ref 0.7–4.0)
MCH: 22.4 pg — ABNORMAL LOW (ref 26.0–34.0)
MCHC: 31.9 g/dL (ref 30.0–36.0)
MCV: 70.2 fL — ABNORMAL LOW (ref 80.0–100.0)
Monocytes Absolute: 0.7 10*3/uL (ref 0.1–1.0)
Monocytes Relative: 12 %
Neutro Abs: 2 10*3/uL (ref 1.7–7.7)
Neutrophils Relative %: 35 %
Platelet Count: 283 10*3/uL (ref 150–400)
RBC: 4.2 MIL/uL (ref 3.87–5.11)
RDW: 26.9 % — ABNORMAL HIGH (ref 11.5–15.5)
WBC Count: 5.7 10*3/uL (ref 4.0–10.5)
nRBC: 0 % (ref 0.0–0.2)

## 2023-02-13 MED ORDER — DEXAMETHASONE SODIUM PHOSPHATE 10 MG/ML IJ SOLN
10.0000 mg | Freq: Once | INTRAMUSCULAR | Status: AC
Start: 2023-02-13 — End: 2023-02-13
  Administered 2023-02-13: 10 mg via INTRAVENOUS
  Filled 2023-02-13: qty 1

## 2023-02-13 MED ORDER — FLUOROURACIL CHEMO INJECTION 2.5 GM/50ML
400.0000 mg/m2 | Freq: Once | INTRAVENOUS | Status: AC
Start: 2023-02-13 — End: 2023-02-13
  Administered 2023-02-13: 1000 mg via INTRAVENOUS
  Filled 2023-02-13: qty 20

## 2023-02-13 MED ORDER — DEXTROSE 5 % IV SOLN: Freq: Once | INTRAVENOUS | Status: AC

## 2023-02-13 MED ORDER — SODIUM CHLORIDE 0.9 % IV SOLN
300.0000 mg | Freq: Once | INTRAVENOUS | Status: DC
  Filled 2023-02-13: qty 15

## 2023-02-13 MED ORDER — SODIUM CHLORIDE 0.9 % IV SOLN
2400.0000 mg/m2 | INTRAVENOUS | Status: DC
  Administered 2023-02-13: 5700 mg via INTRAVENOUS
  Filled 2023-02-13: qty 114

## 2023-02-13 MED ORDER — LEUCOVORIN CALCIUM INJECTION 350 MG
400.0000 mg/m2 | Freq: Once | INTRAVENOUS | Status: AC
  Administered 2023-02-13: 948 mg via INTRAVENOUS
  Filled 2023-02-13: qty 47.4

## 2023-02-13 MED ORDER — PALONOSETRON HCL INJECTION 0.25 MG/5ML
0.2500 mg | Freq: Once | INTRAVENOUS | Status: AC
Start: 2023-02-13 — End: 2023-02-13
  Administered 2023-02-13: 0.25 mg via INTRAVENOUS
  Filled 2023-02-13: qty 5

## 2023-02-13 MED ORDER — SODIUM CHLORIDE 0.9% FLUSH
10.0000 mL | Freq: Once | INTRAVENOUS | Status: AC
  Administered 2023-02-13: 10 mL

## 2023-02-13 MED ORDER — LORATADINE 10 MG PO TABS: 10.0000 mg | ORAL_TABLET | Freq: Once | ORAL | Status: DC

## 2023-02-13 MED ORDER — DEXTROSE 5 % IV SOLN
85.0000 mg/m2 | Freq: Once | INTRAVENOUS | Status: AC
  Administered 2023-02-13: 200 mg via INTRAVENOUS
  Filled 2023-02-13: qty 40

## 2023-02-13 NOTE — Addendum Note (Signed)
Addended by: Malachy Mood on: 02/13/2023 05:36 PM   Modules accepted: Orders

## 2023-02-13 NOTE — Patient Instructions (Signed)
[facility] - A DEPT OF [facility]  Discharge Instructions: Thank you for choosing [facility] to provide your oncology and hematology care.   If you have a lab appointment with the Cancer Center, please go directly to the Cancer Center and check in at the registration area.   Wear comfortable clothing and clothing appropriate for easy access to any Portacath or PICC line.   We strive to give you quality time with your provider. You may need to reschedule your appointment if you arrive late (15 or more minutes).  Arriving late affects you and other patients whose appointments are after yours.  Also, if you miss three or more appointments without notifying the office, you may be dismissed from the clinic at the provider's discretion.      For prescription refill requests, have your pharmacy contact our office and allow 72 hours for refills to be completed.    Today you received the following chemotherapy and/or immunotherapy agents: Oxaliplatin, Leucovorin and Fluorouracil.   To help prevent nausea and vomiting after your treatment, we encourage you to take your nausea medication as directed.  BELOW ARE SYMPTOMS THAT SHOULD BE REPORTED IMMEDIATELY: *FEVER GREATER THAN 100.4 F (38 C) OR HIGHER *CHILLS OR SWEATING *NAUSEA AND VOMITING THAT IS NOT CONTROLLED WITH YOUR NAUSEA MEDICATION *UNUSUAL SHORTNESS OF BREATH *UNUSUAL BRUISING OR BLEEDING *URINARY PROBLEMS (pain or burning when urinating, or frequent urination) *BOWEL PROBLEMS (unusual diarrhea, constipation, pain near the anus) TENDERNESS IN MOUTH AND THROAT WITH OR WITHOUT PRESENCE OF ULCERS (sore throat, sores in mouth, or a toothache) UNUSUAL RASH, SWELLING OR PAIN  UNUSUAL VAGINAL DISCHARGE OR ITCHING   Items with * indicate a potential emergency and should be followed up as soon as possible or go to the Emergency Department if any problems should occur.  Please show the  CHEMOTHERAPY ALERT CARD or IMMUNOTHERAPY ALERT CARD at check-in to the Emergency Department and triage nurse.  Should you have questions after your visit or need to cancel or reschedule your appointment, please contact [facility] - A DEPT OF [facility]  Dept: [phone]  and follow the prompts.  Office hours are 8:00 a.m. to 4:30 p.m. Monday - Friday. Please note that voicemails left after 4:00 p.m. may not be returned until the following business day.  We are closed weekends and major holidays. You have access to a nurse at all times for urgent questions. Please call the main number to the clinic Dept: [phone] and follow the prompts.   For any non-urgent questions, you may also contact your provider using MyChart. We now offer e-Visits for anyone 76 and older to request care online for non-urgent symptoms. For details visit [email or web address].   Also download the MyChart app! Go to the app store, search "MyChart", open the app, select , and log in with your MyChart username and password. The chemotherapy medication bag should finish at 46 hours, 96 hours, or 7 days. For example, if your pump is scheduled for 46 hours and it was put on at 4:00 p.m., it should finish at 2:00 p.m. the day it is scheduled to come off regardless of your appointment time.     Estimated time to finish at: 1:00 PM   If the display on your pump reads "Low Volume" and it is beeping, take the batteries out of the pump and come to the cancer center for it to be taken off.  If the pump alarms go off prior to the pump reading "Low Volume" then call [phone] and someone can assist you.  If the plunger comes out and the chemotherapy medication is leaking out, please use your home chemo spill kit to clean up the spill. Do NOT use paper towels or other household products.  If you have problems or questions regarding your pump, please call either [phone] (24  hours a day) or the cancer center Monday-Friday 8:00 a.m.- 4:30 p.m. at the clinic number and we will assist you. If you are unable to get assistance, then go to the nearest Emergency Department and ask the staff to contact the IV team for assistance.   Iron Sucrose Injection What is this medication? IRON SUCROSE (EYE ern SOO krose) treats low levels of iron (iron deficiency anemia) in people with kidney disease. Iron is a mineral that plays an important role in making red blood cells, which carry oxygen from your lungs to the rest of your body. This medicine may be used for other purposes; ask your health care provider or pharmacist if you have questions. COMMON BRAND NAME(S): Venofer What should I tell my care team before I take this medication? They need to know if you have any of these conditions: Anemia not caused by low iron levels Heart disease High levels of iron in the blood Kidney disease Liver disease An unusual or allergic reaction to iron, other medications, foods, dyes, or preservatives Pregnant or trying to get pregnant Breastfeeding How should I use this medication? This medication is for infusion into a vein. It is given in a hospital or clinic setting. Talk to your care team about the use of this medication in children. While this medication may be prescribed for children as young as 2 years for selected conditions, precautions do apply. Overdosage: If you think you have taken too much of this medicine contact a poison control center or emergency room at once. NOTE: This medicine is only for you. Do not share this medicine with others. What if I miss a dose? Keep appointments for follow-up doses. It is important not to miss your dose. Call your care team if you are unable to keep an appointment. What may interact with this medication? Do not take this medication with any of the following: Deferoxamine Dimercaprol Other iron products This medication may also interact with  the following: Chloramphenicol Deferasirox This list may not describe all possible interactions. Give your health care provider a list of all the medicines, herbs, non-prescription drugs, or dietary supplements you use. Also tell them if you smoke, drink alcohol, or use illegal drugs. Some items may interact with your medicine. What should I watch for while using this medication? Visit your care team regularly. Tell your care team if your symptoms do not start to get better or if they get worse. You may need blood work done while you are taking this medication. You may need to follow a special diet. Talk to your care team. Foods that contain iron include: whole grains/cereals, dried fruits, beans, or peas, leafy green vegetables, and organ meats (liver, kidney). What side effects may I notice from receiving this medication? Side effects that you should report to your care team as soon as possible: Allergic reactions--skin rash, itching, hives, swelling of the face, lips, tongue, or throat Low blood pressure--dizziness, feeling faint or lightheaded, blurry vision Shortness of breath Side effects that usually do not require medical attention (report to your care team if they continue  or are bothersome): Flushing Headache Joint pain Muscle pain Nausea Pain, redness, or irritation at injection site This list may not describe all possible side effects. Call your doctor for medical advice about side effects. You may report side effects to FDA at 1-800-FDA-1088. Where should I keep my medication? This medication is given in a hospital or clinic. It will not be stored at home. NOTE: This sheet is a summary. It may not cover all possible information. If you have questions about this medicine, talk to your doctor, pharmacist, or health care provider.  2024 Elsevier/Gold Standard (2022-08-31 00:00:00)

## 2023-02-13 NOTE — Progress Notes (Addendum)
[facility]   [phone] [phone]   Clinic Follow up Note   Patient Care Team: Early, Sung Amabile, NP as PCP - General (Nurse Practitioner) Malachy Mood, MD as Consulting Physician (Hematology and Oncology) Romie Levee, MD as Consulting Physician (General Surgery) Lynann Bologna, DO as Consulting Physician (Gastroenterology)  Date of Service:  02/13/2023  CHIEF COMPLAINT: f/u of metastatic colon cancer  CURRENT THERAPY:  First-line FOLFOX  Oncology History   Cancer of left colon (HCC) --cTxN0M1, stage IV with oligo liver metastasis, MMR normal, 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  The left descending colon cancer was only 4 mm, removed by polypectomy. The largest 3 cm, fungating mass in the rectosigmoid junction is 15 cm from anal verge on colonoscopy, biopsy confirmed moderate differentiated adenocarcinoma -I reviewed her staging CT chest from November 18, 2022, which showed no definitive evidence of metastasis, but showed borderline enlarged thoracic adenopathy.  I will obtain a PET scan for further evaluation. -Her liver MRI unfortunately showed a 1.2 cm mass in the posterior dome of liver, segment 7, biopsy on 12/03/22 confirmed metastasis from colon cancer, I reviewed with her.  -Given her metastatic disease, I recommend chemotherapy first.  She started FOLFOX on 12/18/2022 -She has been seen by colorectal surgeon Dr. Maisie Fus -plan to reevaluated after 3 months of chemo, and discuss surgical resection of the primary tumor and liver metastasis. -She is not a candidate for EGFR inhibitor, will hold beva for now due to pending surgeries.     Assessment and Plan    Metastatic Colon Cancer Currently undergoing chemotherapy with noted side effects of fatigue, diarrhea, and cold sensitivity. No nausea, vomiting, or neuropathy. Discussed potential surgical resection pending next scan results. -Continue current chemotherapy  regimen. -Next chemotherapy session scheduled for February 27, 2023. -Order scan after next treatment cycle. -Contact Dr. Donell Beers and Dr. Freida Busman regarding potential surgical resection. -Schedule additional chemotherapy cycle for March 14, 2023, as a contingency plan pending surgical scheduling. -Consult with Dr. Maisie Fus regarding need for MRI prior to potential surgery. -Check blood tests when results are available. -Continue monitoring iron levels periodically. -Follow-up appointment in two weeks.     Plan -Lab reviewed, adequate for treatment, will proceed cycle 5 FOLFOX today -Will proceed last dose Venofer 300 mg today -f/u in 2 weeks before chemo   Addendum -I spoke with Dr. Donell Beers, and she responded "the celiac axis adenopathy is concerning for disease that wouldn't be treated.  In that setting I would favor ablation.  To get both liver lesions out, it would require almost a complete right lobectomy.   That's a lot of liver for two small lesions." -will order restaging CT CAP in 2-3 weeks   SUMMARY OF ONCOLOGIC HISTORY: Oncology History  Cancer of left colon (HCC)  11/21/2022 Initial Diagnosis   Cancer of left colon (HCC)   12/18/2022 -  Chemotherapy   Patient is on Treatment Plan : COLORECTAL FOLFOX q14d     12/26/2022 PET scan   NM PET skull base to thigh  IMPRESSION: 1. Two hypermetabolic liver lesions consistent with metastatic disease. 2. Small hypermetabolic celiac axis and sigmoid mesocolon nodes suggesting metastatic adenopathy. 3. No findings for metastatic disease involving the chest or bony structures. 4. Diffuse hypermetabolism throughout the colon possibly related to recently eating or taking insulin making it difficult to identified the patient's sigmoid cancer.      Discussed the use of AI scribe software for clinical note  transcription with the patient, who gave verbal consent to proceed.  History of Present Illness   The patient, with a history of  metastatic colon cancer, presents for a follow-up visit. She reports feeling tired for about two to three days after her last chemotherapy session but was still able to function. She also experienced some diarrhea, which was managed with Imodium. She denies any nausea or vomiting but confirms cold sensitivity and has been consuming room temperature foods and drinks. She denies any numbness or tingling but notes discoloration, which she attributes to the chemotherapy. She is scheduled for another round of chemotherapy on November 20th. She also mentions a potential resection, but no consultation has been scheduled yet.         All other systems were reviewed with the patient and are negative.  MEDICAL HISTORY:  Past Medical History:  Diagnosis Date   Diabetes mellitus without complication (HCC)     SURGICAL HISTORY: Past Surgical History:  Procedure Laterality Date   CESAREAN SECTION     CHOLECYSTECTOMY     IR IMAGING GUIDED PORT INSERTION  12/03/2022   IR US LIVER BIOPSY  12/03/2022    I have reviewed the social history and family history with the patient and they are unchanged from previous note.  ALLERGIES:  has No Known Allergies.  MEDICATIONS:  Current Outpatient Medications  Medication Sig Dispense Refill   atorvastatin (LIPITOR) 20 MG tablet Take 20 mg by mouth daily.     azelastine (OPTIVAR) 0.05 % ophthalmic solution Place 1 drop into both eyes 2 (two) times daily. 6 mL 12   dexamethasone (DECADRON) 4 MG tablet Take 2 tablets (8 mg total) by mouth daily. Start the day after chemotherapy for 2 days. Take with food. 30 tablet 1   Iron, Ferrous Sulfate, 325 (65 Fe) MG TABS Take 325 mg by mouth daily. 30 tablet 11   lidocaine-prilocaine (EMLA) cream Apply to affected area once 30 g 3   nystatin (MYCOSTATIN/NYSTOP) powder Apply 1 application to skin folds 3 (three) times daily as needed 60 g 2   ondansetron (ZOFRAN) 8 MG tablet Take 1 tablet (8 mg total) by mouth every 8 (eight)  hours as needed for nausea or vomiting. Start on the third day after chemotherapy. 30 tablet 1   potassium chloride SA (KLOR-CON M) 20 MEQ tablet Take 1 tablet (20 mEq total) by mouth 2 (two) times daily. Take 1 tablet 3 times daily for 3 days, then twice daily 60 tablet 1   prochlorperazine (COMPAZINE) 10 MG tablet Take 1 tablet (10 mg total) by mouth every 6 (six) hours as needed for nausea or vomiting. 30 tablet 1   No current facility-administered medications for this visit.   Facility-Administered Medications Ordered in Other Visits  Medication Dose Route Frequency Provider Last Rate Last Admin   fluorouracil (ADRUCIL) 5,700 mg in sodium chloride 0.9 % 136 mL chemo infusion  2,400 mg/m2 (Treatment Plan Recorded) Intravenous 1 day or 1 dose Pollyann Samples, NP   Infusion Verify at 02/13/23 1617   iron sucrose (VENOFER) 300 mg in sodium chloride 0.9 % 250 mL IVPB  300 mg Intravenous Once Malachy Mood, MD       loratadine (CLARITIN) tablet 10 mg  10 mg Oral Once Malachy Mood, MD        PHYSICAL EXAMINATION: ECOG PERFORMANCE STATUS: 1 - Symptomatic but completely ambulatory  Vitals:   02/13/23 1205  BP: (!) 140/70  Pulse: 87  Resp: 18  Temp:  98.1 F (36.7 C)  SpO2: 100%   Wt Readings from Last 3 Encounters:  02/13/23 279 lb 6 oz (126.7 kg)  01/29/23 270 lb 8 oz (122.7 kg)  01/15/23 271 lb 14.4 oz (123.3 kg)     GENERAL:alert, no distress and comfortable SKIN: skin color, texture, turgor are normal, no rashes or significant lesions EYES: normal, Conjunctiva are pink and non-injected, sclera clear NECK: supple, thyroid normal size, non-tender, without nodularity LYMPH:  no palpable lymphadenopathy in the cervical, axillary  LUNGS: clear to auscultation and percussion with normal breathing effort HEART: regular rate & rhythm and no murmurs and no lower extremity edema ABDOMEN:abdomen soft, non-tender and normal bowel sounds Musculoskeletal:no cyanosis of digits and no clubbing  NEURO:  alert & oriented x 3 with fluent speech, no focal motor/sensory deficits    LABORATORY DATA:  I have reviewed the data as listed    Latest Ref Rng & Units 02/13/2023   11:48 AM 01/29/2023    9:17 AM 01/15/2023    8:25 AM  CBC  WBC 4.0 - 10.5 K/uL 5.7  7.5  7.2   Hemoglobin 12.0 - 15.0 g/dL 9.4  9.8  32.2   Hematocrit 36.0 - 46.0 % 29.5  31.6  32.3   Platelets 150 - 400 K/uL 283  284  386         Latest Ref Rng & Units 02/13/2023   11:48 AM 01/29/2023    9:17 AM 01/15/2023    8:25 AM  CMP  Glucose 70 - 99 mg/dL 025  427  062   BUN 6 - 20 mg/dL 11  8  18    Creatinine 0.44 - 1.00 mg/dL 3.76  2.83  1.51   Sodium 135 - 145 mmol/L 139  138  138   Potassium 3.5 - 5.1 mmol/L 4.1  3.9  3.9   Chloride 98 - 111 mmol/L 104  103  103   CO2 22 - 32 mmol/L 27  26  24    Calcium 8.9 - 10.3 mg/dL 9.2  9.6  9.7   Total Protein 6.5 - 8.1 g/dL 7.2  7.4  7.6   Total Bilirubin <1.2 mg/dL 0.3  0.3  0.2   Alkaline Phos 38 - 126 U/L 68  63  57   AST 15 - 41 U/L 14  23  15    ALT 0 - 44 U/L 14  23  16        RADIOGRAPHIC STUDIES: I have personally reviewed the radiological images as listed and agreed with the findings in the report. No results found.    Orders Placed This Encounter  Procedures   CBC with Differential (Cancer Center Only)    Standing Status:   Future    Standing Expiration Date:   03/13/2024   CMP (Cancer Center only)    Standing Status:   Future    Standing Expiration Date:   03/13/2024   All questions were answered. The patient knows to call the clinic with any problems, questions or concerns. No barriers to learning was detected. The total time spent in the appointment was 30 minutes.     Malachy Mood, MD 02/13/2023

## 2023-02-13 NOTE — Assessment & Plan Note (Signed)
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  The left descending colon cancer was only 4 mm, removed by polypectomy. The largest 3 cm, fungating mass in the rectosigmoid junction is 15 cm from anal verge on colonoscopy, biopsy confirmed moderate differentiated adenocarcinoma -I reviewed her staging CT chest from November 18, 2022, which showed no definitive evidence of metastasis, but showed borderline enlarged thoracic adenopathy.  I will obtain a PET scan for further evaluation. -Her liver MRI unfortunately showed a 1.2 cm mass in the posterior dome of liver, segment 7, biopsy on 12/03/22 confirmed metastasis from colon cancer, I reviewed with her.  -Given her metastatic disease, I recommend chemotherapy first.  She started FOLFOX on 12/18/2022 -She has been seen by colorectal surgeon Dr. Maisie Fus -plan to reevaluated after 3 months of chemo, and discuss surgical resection of the primary tumor and liver metastasis. -She is not a candidate for EGFR inhibitor, will hold beva for now due to pending surgeries.

## 2023-02-15 ENCOUNTER — Inpatient Hospital Stay: Payer: 59

## 2023-02-15 DIAGNOSIS — C186 Malignant neoplasm of descending colon: Secondary | ICD-10-CM

## 2023-02-15 DIAGNOSIS — Z5111 Encounter for antineoplastic chemotherapy: Secondary | ICD-10-CM | POA: Diagnosis not present

## 2023-02-15 MED ORDER — SODIUM CHLORIDE 0.9% FLUSH
10.0000 mL | INTRAVENOUS | Status: DC | PRN
  Administered 2023-02-15: 10 mL

## 2023-02-15 MED ORDER — HEPARIN SOD (PORK) LOCK FLUSH 100 UNIT/ML IV SOLN
500.0000 [IU] | Freq: Once | INTRAVENOUS | Status: AC | PRN
  Administered 2023-02-15: 500 [IU]

## 2023-02-27 ENCOUNTER — Inpatient Hospital Stay: Payer: 59

## 2023-02-27 ENCOUNTER — Other Ambulatory Visit: Payer: Self-pay

## 2023-02-27 ENCOUNTER — Encounter: Payer: Self-pay | Admitting: Hematology

## 2023-02-27 ENCOUNTER — Inpatient Hospital Stay (HOSPITAL_BASED_OUTPATIENT_CLINIC_OR_DEPARTMENT_OTHER): Payer: 59 | Admitting: Hematology

## 2023-02-27 VITALS — BP 143/76 | HR 88 | Temp 98.2°F | Resp 18 | Wt 279.5 lb

## 2023-02-27 DIAGNOSIS — C186 Malignant neoplasm of descending colon: Secondary | ICD-10-CM

## 2023-02-27 DIAGNOSIS — Z95828 Presence of other vascular implants and grafts: Secondary | ICD-10-CM

## 2023-02-27 DIAGNOSIS — Z5111 Encounter for antineoplastic chemotherapy: Secondary | ICD-10-CM | POA: Diagnosis not present

## 2023-02-27 DIAGNOSIS — D500 Iron deficiency anemia secondary to blood loss (chronic): Secondary | ICD-10-CM

## 2023-02-27 DIAGNOSIS — D649 Anemia, unspecified: Secondary | ICD-10-CM

## 2023-02-27 LAB — CMP (CANCER CENTER ONLY)
ALT: 11 U/L (ref 0–44)
AST: 13 U/L — ABNORMAL LOW (ref 15–41)
Albumin: 3.7 g/dL (ref 3.5–5.0)
Alkaline Phosphatase: 65 U/L (ref 38–126)
Anion gap: 9 (ref 5–15)
BUN: 11 mg/dL (ref 6–20)
CO2: 26 mmol/L (ref 22–32)
Calcium: 9.2 mg/dL (ref 8.9–10.3)
Chloride: 103 mmol/L (ref 98–111)
Creatinine: 0.69 mg/dL (ref 0.44–1.00)
GFR, Estimated: >60 mL/min (ref >60)
Glucose, Bld: 140 mg/dL — ABNORMAL HIGH (ref 70–99)
Potassium: 3.6 mmol/L (ref 3.5–5.1)
Sodium: 138 mmol/L (ref 135–145)
Total Bilirubin: 0.4 mg/dL (ref <1.2)
Total Protein: 7.2 g/dL (ref 6.5–8.1)

## 2023-02-27 LAB — FERRITIN: Ferritin: 189 ng/mL (ref 11–307)

## 2023-02-27 LAB — CEA (ACCESS): CEA (CHCC): 3.22 ng/mL (ref 0.00–5.00)

## 2023-02-27 LAB — CBC WITH DIFFERENTIAL (CANCER CENTER ONLY)
Abs Immature Granulocytes: 0.01 10*3/uL (ref 0.00–0.07)
Basophils Absolute: 0 10*3/uL (ref 0.0–0.1)
Basophils Relative: 1 %
Eosinophils Absolute: 0.1 10*3/uL (ref 0.0–0.5)
Eosinophils Relative: 1 %
HCT: 29 % — ABNORMAL LOW (ref 36.0–46.0)
Hemoglobin: 9.2 g/dL — ABNORMAL LOW (ref 12.0–15.0)
Immature Granulocytes: 0 %
Lymphocytes Relative: 40 %
Lymphs Abs: 2.3 10*3/uL (ref 0.7–4.0)
MCH: 22.7 pg — ABNORMAL LOW (ref 26.0–34.0)
MCHC: 31.7 g/dL (ref 30.0–36.0)
MCV: 71.6 fL — ABNORMAL LOW (ref 80.0–100.0)
Monocytes Absolute: 0.7 10*3/uL (ref 0.1–1.0)
Monocytes Relative: 12 %
Neutro Abs: 2.7 10*3/uL (ref 1.7–7.7)
Neutrophils Relative %: 46 %
Platelet Count: 281 10*3/uL (ref 150–400)
RBC: 4.05 MIL/uL (ref 3.87–5.11)
RDW: 25.2 % — ABNORMAL HIGH (ref 11.5–15.5)
WBC Count: 5.8 10*3/uL (ref 4.0–10.5)
nRBC: 0 % (ref 0.0–0.2)

## 2023-02-27 MED ORDER — SODIUM CHLORIDE 0.9% FLUSH: 10.0000 mL | INTRAVENOUS | Status: DC | PRN

## 2023-02-27 MED ORDER — OXALIPLATIN CHEMO INJECTION 100 MG/20ML
85.0000 mg/m2 | Freq: Once | INTRAVENOUS | Status: AC
  Administered 2023-02-27: 200 mg via INTRAVENOUS
  Filled 2023-02-27: qty 40

## 2023-02-27 MED ORDER — LEUCOVORIN CALCIUM INJECTION 350 MG
400.0000 mg/m2 | Freq: Once | INTRAVENOUS | Status: AC
  Administered 2023-02-27: 948 mg via INTRAVENOUS
  Filled 2023-02-27: qty 47.4

## 2023-02-27 MED ORDER — HEPARIN SOD (PORK) LOCK FLUSH 100 UNIT/ML IV SOLN
500.0000 [IU] | Freq: Once | INTRAVENOUS | Status: DC | PRN
Start: 2023-02-27 — End: 2023-02-27

## 2023-02-27 MED ORDER — DEXAMETHASONE SODIUM PHOSPHATE 10 MG/ML IJ SOLN
10.0000 mg | Freq: Once | INTRAMUSCULAR | Status: AC
Start: 2023-02-27 — End: 2023-02-27
  Administered 2023-02-27: 10 mg via INTRAVENOUS
  Filled 2023-02-27: qty 1

## 2023-02-27 MED ORDER — DEXTROSE 5 % IV SOLN: Freq: Once | INTRAVENOUS | Status: AC

## 2023-02-27 MED ORDER — FLUOROURACIL CHEMO INJECTION 2.5 GM/50ML
400.0000 mg/m2 | Freq: Once | INTRAVENOUS | Status: AC
  Administered 2023-02-27: 1000 mg via INTRAVENOUS
  Filled 2023-02-27: qty 20

## 2023-02-27 MED ORDER — FLUOROURACIL CHEMO INJECTION 5 GM/100ML
2400.0000 mg/m2 | INTRAVENOUS | Status: DC
  Administered 2023-02-27: 5700 mg via INTRAVENOUS
  Filled 2023-02-27: qty 114

## 2023-02-27 MED ORDER — SODIUM CHLORIDE 0.9 % IV SOLN: Freq: Once | INTRAVENOUS | Status: AC

## 2023-02-27 MED ORDER — PALONOSETRON HCL INJECTION 0.25 MG/5ML
0.2500 mg | Freq: Once | INTRAVENOUS | Status: AC
  Administered 2023-02-27: 0.25 mg via INTRAVENOUS
  Filled 2023-02-27: qty 5

## 2023-02-27 NOTE — Addendum Note (Signed)
Addended by: Malachy Mood on: 02/27/2023 08:07 PM   Modules accepted: Orders

## 2023-02-27 NOTE — Addendum Note (Signed)
Addended by: Malachy Mood on: 02/27/2023 08:06 PM   Modules accepted: Orders

## 2023-02-27 NOTE — Assessment & Plan Note (Signed)
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  The left descending colon cancer was only 4 mm, removed by polypectomy. The largest 3 cm, fungating mass in the rectosigmoid junction is 15 cm from anal verge on colonoscopy, biopsy confirmed moderate differentiated adenocarcinoma -I reviewed her staging CT chest from November 18, 2022, which showed no definitive evidence of metastasis, but showed borderline enlarged thoracic adenopathy.  I will obtain a PET scan for further evaluation. -Her liver MRI unfortunately showed a 1.2 cm mass in the posterior dome of liver, segment 7, biopsy on 12/03/22 confirmed metastasis from colon cancer, I reviewed with her.  -Given her metastatic disease, I recommend chemotherapy first.  She started FOLFOX on 12/18/2022 -She has been seen by colorectal surgeon Dr. Maisie Fus -plan to reevaluated after 3 months of chemo, and discuss surgical resection of the primary tumor and liver metastasis. -She is not a candidate for EGFR inhibitor, will hold beva for now due to pending surgeries.

## 2023-02-27 NOTE — Progress Notes (Signed)
Per Dr. Mosetta Putt, holding iron today to await new lab results.

## 2023-02-27 NOTE — Progress Notes (Addendum)
[facility]   [phone] [phone]   Clinic Follow up Note   Patient Care Team: Early, Sung Amabile, NP as PCP - General (Nurse Practitioner) Malachy Mood, MD as Consulting Physician (Hematology and Oncology) Romie Levee, MD as Consulting Physician (General Surgery) Lynann Bologna, DO as Consulting Physician (Gastroenterology)  Date of Service:  02/27/2023  CHIEF COMPLAINT: f/u of metastatic colon cancer  CURRENT THERAPY:  Neoadjuvant chemotherapy FOLFOX  Oncology History   Cancer of left colon (HCC) --cTxN0M1, stage IV with oligo liver metastasis, MMR normal, 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  The left descending colon cancer was only 4 mm, removed by polypectomy. The largest 3 cm, fungating mass in the rectosigmoid junction is 15 cm from anal verge on colonoscopy, biopsy confirmed moderate differentiated adenocarcinoma -I reviewed her staging CT chest from November 18, 2022, which showed no definitive evidence of metastasis, but showed borderline enlarged thoracic adenopathy.  I will obtain a PET scan for further evaluation. -Her liver MRI unfortunately showed a 1.2 cm mass in the posterior dome of liver, segment 7, biopsy on 12/03/22 confirmed metastasis from colon cancer, I reviewed with her.  -Given her metastatic disease, I recommend chemotherapy first.  She started FOLFOX on 12/18/2022 -She has been seen by colorectal surgeon Dr. Maisie Fus -plan to reevaluated after 3 months of chemo, and discuss surgical resection of the primary tumor and liver metastasis. -She is not a candidate for EGFR inhibitor, will hold beva for now due to pending surgeries.     Assessment and Plan    Metastatic Colon Cancer Currently on the sixth cycle of chemotherapy out of eight planned cycles. No issues with the last cycle, no pain, and no other concerns. Eating well with no numbness. Lab results show normal renal and hepatic function,  stable blood counts, and a tumor marker decrease from 27 to 3, indicating a good response. Dr. Karsten Ro recommends liver ablation due to small hepatic lesions to preserve normal liver tissue, instead of surgery. Discussed benefits of ablation over surgery. Timing of liver procedure and subsequent colon surgery is being coordinated with Dr. Maisie Fus. Discussed completing chemotherapy cycles before or after surgery, depending on Dr. Maurine Minister schedule. - Discuss liver ablation with interventional radiology - Coordinate with Dr. Maisie Fus for scheduling liver procedure and colon surgery - Continue chemotherapy with two more cycles planned - Schedule follow-up appointment in two weeks.    -restaging CT in 3 weeks  -f/u in 2 weeks      SUMMARY OF ONCOLOGIC HISTORY: Oncology History  Cancer of left colon (HCC)  11/21/2022 Initial Diagnosis   Cancer of left colon (HCC)   12/18/2022 -  Chemotherapy   Patient is on Treatment Plan : COLORECTAL FOLFOX q14d     12/26/2022 PET scan   NM PET skull base to thigh  IMPRESSION: 1. Two hypermetabolic liver lesions consistent with metastatic disease. 2. Small hypermetabolic celiac axis and sigmoid mesocolon nodes suggesting metastatic adenopathy. 3. No findings for metastatic disease involving the chest or bony structures. 4. Diffuse hypermetabolism throughout the colon possibly related to recently eating or taking insulin making it difficult to identified the patient's sigmoid cancer.      Discussed the use of AI scribe software for clinical note transcription with the patient, who gave verbal consent to proceed.  History of Present Illness   The patient, with metastatic colon cancer, presents for a follow-up visit after her sixth cycle of chemotherapy. She denies any issues  with the last cycle of chemotherapy and reports no pain or other concerns. She has been eating well and has not experienced any adverse effects from the cortisone.         All other  systems were reviewed with the patient and are negative.  MEDICAL HISTORY:  Past Medical History:  Diagnosis Date   Diabetes mellitus without complication (HCC)     SURGICAL HISTORY: Past Surgical History:  Procedure Laterality Date   CESAREAN SECTION     CHOLECYSTECTOMY     IR IMAGING GUIDED PORT INSERTION  12/03/2022   IR US LIVER BIOPSY  12/03/2022    I have reviewed the social history and family history with the patient and they are unchanged from previous note.  ALLERGIES:  has No Known Allergies.  MEDICATIONS:  Current Outpatient Medications  Medication Sig Dispense Refill   atorvastatin (LIPITOR) 20 MG tablet Take 20 mg by mouth daily.     azelastine (OPTIVAR) 0.05 % ophthalmic solution Place 1 drop into both eyes 2 (two) times daily. 6 mL 12   dexamethasone (DECADRON) 4 MG tablet Take 2 tablets (8 mg total) by mouth daily. Start the day after chemotherapy for 2 days. Take with food. 30 tablet 1   Iron, Ferrous Sulfate, 325 (65 Fe) MG TABS Take 325 mg by mouth daily. 30 tablet 11   lidocaine-prilocaine (EMLA) cream Apply to affected area once 30 g 3   nystatin (MYCOSTATIN/NYSTOP) powder Apply 1 application to skin folds 3 (three) times daily as needed 60 g 2   ondansetron (ZOFRAN) 8 MG tablet Take 1 tablet (8 mg total) by mouth every 8 (eight) hours as needed for nausea or vomiting. Start on the third day after chemotherapy. 30 tablet 1   potassium chloride SA (KLOR-CON M) 20 MEQ tablet Take 1 tablet (20 mEq total) by mouth 2 (two) times daily. Take 1 tablet 3 times daily for 3 days, then twice daily 60 tablet 1   prochlorperazine (COMPAZINE) 10 MG tablet Take 1 tablet (10 mg total) by mouth every 6 (six) hours as needed for nausea or vomiting. 30 tablet 1   No current facility-administered medications for this visit.   Facility-Administered Medications Ordered in Other Visits  Medication Dose Route Frequency Provider Last Rate Last Admin   fluorouracil (ADRUCIL) 5,700 mg  in sodium chloride 0.9 % 136 mL chemo infusion  2,400 mg/m2 (Treatment Plan Recorded) Intravenous 1 day or 1 dose Malachy Mood, MD   Infusion Verify at 02/27/23 1536   heparin lock flush 100 unit/mL  500 Units Intracatheter Once PRN Malachy Mood, MD       sodium chloride flush (NS) 0.9 % injection 10 mL  10 mL Intracatheter PRN Malachy Mood, MD        PHYSICAL EXAMINATION: ECOG PERFORMANCE STATUS: 1 - Symptomatic but completely ambulatory  There were no vitals filed for this visit. Wt Readings from Last 3 Encounters:  02/27/23 279 lb 8 oz (126.8 kg)  02/13/23 279 lb 6 oz (126.7 kg)  01/29/23 270 lb 8 oz (122.7 kg)     GENERAL:alert, no distress and comfortable SKIN: skin color, texture, turgor are normal, no rashes or significant lesions EYES: normal, Conjunctiva are pink and non-injected, sclera clear NECK: supple, thyroid normal size, non-tender, without nodularity LYMPH:  no palpable lymphadenopathy in the cervical, axillary  LUNGS: clear to auscultation and percussion with normal breathing effort HEART: regular rate & rhythm and no murmurs and no lower extremity edema ABDOMEN:abdomen soft, non-tender and  normal bowel sounds Musculoskeletal:no cyanosis of digits and no clubbing  NEURO: alert & oriented x 3 with fluent speech, no focal motor/sensory deficits    LABORATORY DATA:  I have reviewed the data as listed    Latest Ref Rng & Units 02/27/2023   10:11 AM 02/13/2023   11:48 AM 01/29/2023    9:17 AM  CBC  WBC 4.0 - 10.5 K/uL 5.8  5.7  7.5   Hemoglobin 12.0 - 15.0 g/dL 9.2  9.4  9.8   Hematocrit 36.0 - 46.0 % 29.0  29.5  31.6   Platelets 150 - 400 K/uL 281  283  284         Latest Ref Rng & Units 02/27/2023   10:11 AM 02/13/2023   11:48 AM 01/29/2023    9:17 AM  CMP  Glucose 70 - 99 mg/dL 578  469  629   BUN 6 - 20 mg/dL 11  11  8    Creatinine 0.44 - 1.00 mg/dL 5.28  4.13  2.44   Sodium 135 - 145 mmol/L 138  139  138   Potassium 3.5 - 5.1 mmol/L 3.6  4.1  3.9    Chloride 98 - 111 mmol/L 103  104  103   CO2 22 - 32 mmol/L 26  27  26    Calcium 8.9 - 10.3 mg/dL 9.2  9.2  9.6   Total Protein 6.5 - 8.1 g/dL 7.2  7.2  7.4   Total Bilirubin <1.2 mg/dL 0.4  0.3  0.3   Alkaline Phos 38 - 126 U/L 65  68  63   AST 15 - 41 U/L 13  14  23    ALT 0 - 44 U/L 11  14  23        RADIOGRAPHIC STUDIES: I have personally reviewed the radiological images as listed and agreed with the findings in the report. No results found.    Orders Placed This Encounter  Procedures   Ambulatory referral to Interventional Radiology    Referral Priority:   Routine    Referral Type:   Consultation    Referral Reason:   Specialty Services Required    Requested Specialty:   Interventional Radiology    Number of Visits Requested:   1   All questions were answered. The patient knows to call the clinic with any problems, questions or concerns. No barriers to learning was detected. The total time spent in the appointment was 25 minutes.     Malachy Mood, MD 02/27/2023

## 2023-02-27 NOTE — Progress Notes (Signed)
All lab work reviewed and labs are within treatment parameters.

## 2023-02-28 ENCOUNTER — Other Ambulatory Visit: Payer: Self-pay

## 2023-02-28 DIAGNOSIS — C186 Malignant neoplasm of descending colon: Secondary | ICD-10-CM

## 2023-03-01 ENCOUNTER — Other Ambulatory Visit: Payer: Self-pay

## 2023-03-01 ENCOUNTER — Inpatient Hospital Stay: Payer: 59

## 2023-03-01 ENCOUNTER — Encounter: Payer: Self-pay | Admitting: Surgery

## 2023-03-01 VITALS — BP 126/77 | HR 87 | Temp 98.6°F | Resp 16

## 2023-03-01 DIAGNOSIS — Z5111 Encounter for antineoplastic chemotherapy: Secondary | ICD-10-CM | POA: Diagnosis not present

## 2023-03-01 DIAGNOSIS — Z95828 Presence of other vascular implants and grafts: Secondary | ICD-10-CM

## 2023-03-01 MED ORDER — HEPARIN SOD (PORK) LOCK FLUSH 100 UNIT/ML IV SOLN
500.0000 [IU] | Freq: Once | INTRAVENOUS | Status: AC
  Administered 2023-03-01: 500 [IU]

## 2023-03-01 MED ORDER — SODIUM CHLORIDE 0.9% FLUSH
10.0000 mL | Freq: Once | INTRAVENOUS | Status: AC
  Administered 2023-03-01: 10 mL

## 2023-03-05 ENCOUNTER — Ambulatory Visit (HOSPITAL_COMMUNITY)
Admission: RE | Admit: 2023-03-05 | Discharge: 2023-03-05 | Disposition: A | Discharge status: Home / Self Care | Payer: 59 | Source: Ambulatory Visit | Attending: Hematology | Admitting: Hematology

## 2023-03-05 DIAGNOSIS — C186 Malignant neoplasm of descending colon: Secondary | ICD-10-CM | POA: Diagnosis present

## 2023-03-05 MED ORDER — IOHEXOL 300 MG/ML  SOLN
100.0000 mL | Freq: Once | INTRAMUSCULAR | Status: AC | PRN
  Administered 2023-03-05: 100 mL via INTRAVENOUS

## 2023-03-05 MED ORDER — IOHEXOL 300 MG/ML  SOLN
30.0000 mL | Freq: Once | INTRAMUSCULAR | Status: AC | PRN
  Administered 2023-03-05: 30 mL via ORAL

## 2023-03-06 ENCOUNTER — Ambulatory Visit (HOSPITAL_COMMUNITY): Payer: 59

## 2023-03-11 ENCOUNTER — Inpatient Hospital Stay: Payer: 59

## 2023-03-11 ENCOUNTER — Inpatient Hospital Stay (HOSPITAL_BASED_OUTPATIENT_CLINIC_OR_DEPARTMENT_OTHER): Payer: 59 | Admitting: Hematology

## 2023-03-11 ENCOUNTER — Encounter: Payer: Self-pay | Admitting: Hematology

## 2023-03-11 ENCOUNTER — Inpatient Hospital Stay: Payer: 59 | Attending: Hematology

## 2023-03-11 VITALS — BP 146/93 | HR 90 | Temp 98.0°F | Resp 16 | Wt 268.9 lb

## 2023-03-11 DIAGNOSIS — C787 Secondary malignant neoplasm of liver and intrahepatic bile duct: Secondary | ICD-10-CM | POA: Diagnosis not present

## 2023-03-11 DIAGNOSIS — C186 Malignant neoplasm of descending colon: Secondary | ICD-10-CM | POA: Diagnosis not present

## 2023-03-11 DIAGNOSIS — Z95828 Presence of other vascular implants and grafts: Secondary | ICD-10-CM

## 2023-03-11 DIAGNOSIS — Z5111 Encounter for antineoplastic chemotherapy: Secondary | ICD-10-CM | POA: Diagnosis present

## 2023-03-11 LAB — CMP (CANCER CENTER ONLY)
ALT: 11 U/L (ref 0–44)
AST: 13 U/L — ABNORMAL LOW (ref 15–41)
Albumin: 3.8 g/dL (ref 3.5–5.0)
Alkaline Phosphatase: 70 U/L (ref 38–126)
Anion gap: 10 (ref 5–15)
BUN: 11 mg/dL (ref 6–20)
CO2: 27 mmol/L (ref 22–32)
Calcium: 9.4 mg/dL (ref 8.9–10.3)
Chloride: 102 mmol/L (ref 98–111)
Creatinine: 0.64 mg/dL (ref 0.44–1.00)
GFR, Estimated: >60 mL/min (ref >60)
Glucose, Bld: 132 mg/dL — ABNORMAL HIGH (ref 70–99)
Potassium: 3.8 mmol/L (ref 3.5–5.1)
Sodium: 139 mmol/L (ref 135–145)
Total Bilirubin: 0.3 mg/dL (ref <1.2)
Total Protein: 7.2 g/dL (ref 6.5–8.1)

## 2023-03-11 LAB — CBC WITH DIFFERENTIAL (CANCER CENTER ONLY)
Abs Immature Granulocytes: 0.02 10*3/uL (ref 0.00–0.07)
Basophils Absolute: 0 10*3/uL (ref 0.0–0.1)
Basophils Relative: 0 %
Eosinophils Absolute: 0.1 10*3/uL (ref 0.0–0.5)
Eosinophils Relative: 1 %
HCT: 31.2 % — ABNORMAL LOW (ref 36.0–46.0)
Hemoglobin: 9.9 g/dL — ABNORMAL LOW (ref 12.0–15.0)
Immature Granulocytes: 0 %
Lymphocytes Relative: 34 %
Lymphs Abs: 2.7 10*3/uL (ref 0.7–4.0)
MCH: 23.2 pg — ABNORMAL LOW (ref 26.0–34.0)
MCHC: 31.7 g/dL (ref 30.0–36.0)
MCV: 73.2 fL — ABNORMAL LOW (ref 80.0–100.0)
Monocytes Absolute: 0.8 10*3/uL (ref 0.1–1.0)
Monocytes Relative: 9 %
Neutro Abs: 4.4 10*3/uL (ref 1.7–7.7)
Neutrophils Relative %: 56 %
Platelet Count: 362 10*3/uL (ref 150–400)
RBC: 4.26 MIL/uL (ref 3.87–5.11)
RDW: 25.2 % — ABNORMAL HIGH (ref 11.5–15.5)
WBC Count: 7.9 10*3/uL (ref 4.0–10.5)
nRBC: 0 % (ref 0.0–0.2)

## 2023-03-11 MED ORDER — PALONOSETRON HCL INJECTION 0.25 MG/5ML
0.2500 mg | Freq: Once | INTRAVENOUS | Status: AC
Start: 2023-03-11 — End: 2023-03-11
  Administered 2023-03-11: 0.25 mg via INTRAVENOUS
  Filled 2023-03-11: qty 5

## 2023-03-11 MED ORDER — FLUOROURACIL CHEMO INJECTION 2.5 GM/50ML
400.0000 mg/m2 | Freq: Once | INTRAVENOUS | Status: AC
  Administered 2023-03-11: 1000 mg via INTRAVENOUS
  Filled 2023-03-11: qty 20

## 2023-03-11 MED ORDER — SODIUM CHLORIDE 0.9 % IV SOLN
2400.0000 mg/m2 | INTRAVENOUS | Status: DC
  Administered 2023-03-11: 5700 mg via INTRAVENOUS
  Filled 2023-03-11: qty 114

## 2023-03-11 MED ORDER — LEUCOVORIN CALCIUM INJECTION 350 MG
400.0000 mg/m2 | Freq: Once | INTRAVENOUS | Status: AC
  Administered 2023-03-11: 948 mg via INTRAVENOUS
  Filled 2023-03-11: qty 47.4

## 2023-03-11 MED ORDER — DEXAMETHASONE SODIUM PHOSPHATE 10 MG/ML IJ SOLN
10.0000 mg | Freq: Once | INTRAMUSCULAR | Status: AC
  Administered 2023-03-11: 10 mg via INTRAVENOUS
  Filled 2023-03-11: qty 1

## 2023-03-11 MED ORDER — SODIUM CHLORIDE 0.9% FLUSH
10.0000 mL | Freq: Once | INTRAVENOUS | Status: AC
  Administered 2023-03-11: 10 mL

## 2023-03-11 MED ORDER — OXALIPLATIN CHEMO INJECTION 100 MG/20ML
85.0000 mg/m2 | Freq: Once | INTRAVENOUS | Status: AC
  Administered 2023-03-11: 200 mg via INTRAVENOUS
  Filled 2023-03-11: qty 40

## 2023-03-11 MED ORDER — DEXTROSE 5 % IV SOLN: Freq: Once | INTRAVENOUS | Status: AC

## 2023-03-11 NOTE — Patient Instructions (Signed)
[facility] - A DEPT OF [facility]  Discharge Instructions: Thank you for choosing [facility] to provide your oncology and hematology care.   If you have a lab appointment with the Cancer Center, please go directly to the Cancer Center and check in at the registration area.   Wear comfortable clothing and clothing appropriate for easy access to any Portacath or PICC line.   We strive to give you quality time with your provider. You may need to reschedule your appointment if you arrive late (15 or more minutes).  Arriving late affects you and other patients whose appointments are after yours.  Also, if you miss three or more appointments without notifying the office, you may be dismissed from the clinic at the provider's discretion.      For prescription refill requests, have your pharmacy contact our office and allow 72 hours for refills to be completed.    Today you received the following chemotherapy and/or immunotherapy agents: Oxaliplatin, Leucovorin and Fluorouracil.   To help prevent nausea and vomiting after your treatment, we encourage you to take your nausea medication as directed.  BELOW ARE SYMPTOMS THAT SHOULD BE REPORTED IMMEDIATELY: *FEVER GREATER THAN 100.4 F (38 C) OR HIGHER *CHILLS OR SWEATING *NAUSEA AND VOMITING THAT IS NOT CONTROLLED WITH YOUR NAUSEA MEDICATION *UNUSUAL SHORTNESS OF BREATH *UNUSUAL BRUISING OR BLEEDING *URINARY PROBLEMS (pain or burning when urinating, or frequent urination) *BOWEL PROBLEMS (unusual diarrhea, constipation, pain near the anus) TENDERNESS IN MOUTH AND THROAT WITH OR WITHOUT PRESENCE OF ULCERS (sore throat, sores in mouth, or a toothache) UNUSUAL RASH, SWELLING OR PAIN  UNUSUAL VAGINAL DISCHARGE OR ITCHING   Items with * indicate a potential emergency and should be followed up as soon as possible or go to the Emergency Department if any problems should occur.  Please show the CHEMOTHERAPY  ALERT CARD or IMMUNOTHERAPY ALERT CARD at check-in to the Emergency Department and triage nurse.  Should you have questions after your visit or need to cancel or reschedule your appointment, please contact [facility] - A DEPT OF [facility] [place]  Dept: [phone]  and follow the prompts.  Office hours are 8:00 a.m. to 4:30 p.m. Monday - Friday. Please note that voicemails left after 4:00 p.m. may not be returned until the following business day.  We are closed weekends and major holidays. You have access to a nurse at all times for urgent questions. Please call the main number to the clinic Dept: [phone] and follow the prompts.   For any non-urgent questions, you may also contact your provider using MyChart. We now offer e-Visits for anyone 41 and older to request care online for non-urgent symptoms. For details visit [email or web address].   Also download the MyChart app! Go to the app store, search "MyChart", open the app, select [facility] [employer] [facility], and log in with your MyChart username and password. The chemotherapy medication bag should finish at 46 hours, 96 hours, or 7 days. For example, if your pump is scheduled for 46 hours and it was put on at 4:00 p.m., it should finish at 2:00 p.m. the day it is scheduled to come off regardless of your appointment time.     Estimated time to finish at: 1:00 PM   If the display on your pump reads "Low Volume" and it is beeping, take the batteries out of the pump and come to the cancer center for it to  be taken off.   If the pump alarms go off prior to the pump reading "Low Volume" then call [phone] and someone can assist you.  If the plunger comes out and the chemotherapy medication is leaking out, please use your home chemo spill kit to clean up the spill. Do NOT use paper towels or other household products.  If you have problems or questions regarding your pump, please call either [phone] (24 hours a day) or  the cancer center Monday-Friday 8:00 a.m.- 4:30 p.m. at the clinic number and we will assist you. If you are unable to get assistance, then go to the nearest Emergency Department and ask the staff to contact the IV team for assistance.   Iron Sucrose Injection What is this medication? IRON SUCROSE (EYE ern SOO krose) treats low levels of iron (iron deficiency anemia) in people with kidney disease. Iron is a mineral that plays an important role in making red blood cells, which carry oxygen from your lungs to the rest of your body. This medicine may be used for other purposes; ask your health care provider or pharmacist if you have questions. COMMON BRAND NAME(S): Venofer What should I tell my care team before I take this medication? They need to know if you have any of these conditions: Anemia not caused by low iron levels Heart disease High levels of iron in the blood Kidney disease Liver disease An unusual or allergic reaction to iron, other medications, foods, dyes, or preservatives Pregnant or trying to get pregnant Breastfeeding How should I use this medication? This medication is for infusion into a vein. It is given in a hospital or clinic setting. Talk to your care team about the use of this medication in children. While this medication may be prescribed for children as young as 2 years for selected conditions, precautions do apply. Overdosage: If you think you have taken too much of this medicine contact a poison control center or emergency room at once. NOTE: This medicine is only for you. Do not share this medicine with others. What if I miss a dose? Keep appointments for follow-up doses. It is important not to miss your dose. Call your care team if you are unable to keep an appointment. What may interact with this medication? Do not take this medication with any of the following: Deferoxamine Dimercaprol Other iron products This medication may also interact with the  following: Chloramphenicol Deferasirox This list may not describe all possible interactions. Give your health care provider a list of all the medicines, herbs, non-prescription drugs, or dietary supplements you use. Also tell them if you smoke, drink alcohol, or use illegal drugs. Some items may interact with your medicine. What should I watch for while using this medication? Visit your care team regularly. Tell your care team if your symptoms do not start to get better or if they get worse. You may need blood work done while you are taking this medication. You may need to follow a special diet. Talk to your care team. Foods that contain iron include: whole grains/cereals, dried fruits, beans, or peas, leafy green vegetables, and organ meats (liver, kidney). What side effects may I notice from receiving this medication? Side effects that you should report to your care team as soon as possible: Allergic reactions--skin rash, itching, hives, swelling of the face, lips, tongue, or throat Low blood pressure--dizziness, feeling faint or lightheaded, blurry vision Shortness of breath Side effects that usually do not require medical attention (report to your  care team if they continue or are bothersome): Flushing Headache Joint pain Muscle pain Nausea Pain, redness, or irritation at injection site This list may not describe all possible side effects. Call your doctor for medical advice about side effects. You may report side effects to FDA at 1-800-FDA-1088. Where should I keep my medication? This medication is given in a hospital or clinic. It will not be stored at home. NOTE: This sheet is a summary. It may not cover all possible information. If you have questions about this medicine, talk to your doctor, pharmacist, or health care provider.  2024 Elsevier/Gold Standard (2022-08-31 00:00:00)

## 2023-03-11 NOTE — Progress Notes (Signed)
[facility]   [phone] Fax:([phone]) [phone]   Clinic Follow up Note   Patient Care Team: Early, Sung Amabile, NP as PCP - General (Nurse Practitioner) Malachy Mood, MD as Consulting Physician (Hematology and Oncology) Romie Levee, MD as Consulting Physician (General Surgery) Lynann Bologna, DO as Consulting Physician (Gastroenterology)  Date of Service:  03/11/2023  CHIEF COMPLAINT: f/u of metastatic colon cancer  CURRENT THERAPY:  First-line chemotherapy FOLFOX  Oncology History   Cancer of left colon (HCC) --cTxN0M1, stage IV with oligo liver metastasis, MMR normal, 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  The left descending colon cancer was only 4 mm, removed by polypectomy. The largest 3 cm, fungating mass in the rectosigmoid junction is 15 cm from anal verge on colonoscopy, biopsy confirmed moderate differentiated adenocarcinoma -I reviewed her staging CT chest from November 18, 2022, which showed no definitive evidence of metastasis, but showed borderline enlarged thoracic adenopathy.  I will obtain a PET scan for further evaluation. -Her liver MRI unfortunately showed a 1.2 cm mass in the posterior dome of liver, segment 7, biopsy on 12/03/22 confirmed metastasis from colon cancer, I reviewed with her.  -Given her metastatic disease, I recommend chemotherapy first.  She started FOLFOX on 12/18/2022 -She has been seen by colorectal surgeon Dr. Maisie Fus -plan to reevaluated after 3 months of chemo, and discuss surgical resection of the primary tumor and liver metastasis. -She is not a candidate for EGFR inhibitor, will hold beva for now due to pending surgeries.    Assessment and Plan    Metastatic Colon Cancer Undergoing chemotherapy, cycle seven. Reports no significant issues except for a color change, a known side effect. Denies numbness, tingling, bleeding, or swelling. Energy levels are adequate post-chemotherapy. Scheduled  for interventional radiology consultation for liver ablation due to a small hepatic lesion. Prefers surgery in December, but likely to be scheduled early next year due to high demand and holiday schedules. Requires a three to four week break post-chemotherapy before surgery. Discussed six months of chemotherapy, with most cycles before surgery and a few after. Informed about ablation as an alternative to surgery for the liver lesion. - Continue chemotherapy until surgery date is determined - Schedule interventional radiology consultation for liver ablation - Plan for a three to four week break post-chemotherapy before surgery - Schedule next chemotherapy cycle on December 19 - Follow up with Dr. Maisie Fus on December 9 to discuss surgery timing  Plan -Lab reviewed, adequate for treatment, will proceed to cycle 7 chemotherapy at the same dose today. -Follow-up with scheduled next chemotherapy cycle on December 19 - Follow up with Dr. Maisie Fus on December 9 to finalize her date of surgery, will hold chemotherapy for 3 to 4 weeks before surgery -She will call back IR to schedule her consultation      SUMMARY OF ONCOLOGIC HISTORY: Oncology History  Cancer of left colon (HCC)  11/21/2022 Initial Diagnosis   Cancer of left colon (HCC)   12/18/2022 -  Chemotherapy   Patient is on Treatment Plan : COLORECTAL FOLFOX q14d     12/26/2022 PET scan   NM PET skull base to thigh  IMPRESSION: 1. Two hypermetabolic liver lesions consistent with metastatic disease. 2. Small hypermetabolic celiac axis and sigmoid mesocolon nodes suggesting metastatic adenopathy. 3. No findings for metastatic disease involving the chest or bony structures. 4. Diffuse hypermetabolism throughout the colon possibly related to recently eating or taking insulin making it difficult to identified the patient's  sigmoid cancer.      Discussed the use of AI scribe software for clinical note transcription with the patient, who gave  verbal consent to proceed.  History of Present Illness   The patient, a 57 year old with metastatic colon cancer, presents for a routine follow-up. She reports feeling generally well with no new or worsening symptoms. She has noticed a change in skin color, which she was informed is a side effect of chemotherapy. She denies any numbness or tingling. Her energy levels are acceptable, improving a few days after each chemotherapy session. She denies any bleeding.         All other systems were reviewed with the patient and are negative.  MEDICAL HISTORY:  Past Medical History:  Diagnosis Date   Diabetes mellitus without complication (HCC)     SURGICAL HISTORY: Past Surgical History:  Procedure Laterality Date   CESAREAN SECTION     CHOLECYSTECTOMY     IR IMAGING GUIDED PORT INSERTION  12/03/2022   IR US LIVER BIOPSY  12/03/2022    I have reviewed the social history and family history with the patient and they are unchanged from previous note.  ALLERGIES:  has No Known Allergies.  MEDICATIONS:  Current Outpatient Medications  Medication Sig Dispense Refill   atorvastatin (LIPITOR) 20 MG tablet Take 20 mg by mouth daily.     azelastine (OPTIVAR) 0.05 % ophthalmic solution Place 1 drop into both eyes 2 (two) times daily. 6 mL 12   dexamethasone (DECADRON) 4 MG tablet Take 2 tablets (8 mg total) by mouth daily. Start the day after chemotherapy for 2 days. Take with food. 30 tablet 1   Iron, Ferrous Sulfate, 325 (65 Fe) MG TABS Take 325 mg by mouth daily. 30 tablet 11   lidocaine-prilocaine (EMLA) cream Apply to affected area once 30 g 3   nystatin (MYCOSTATIN/NYSTOP) powder Apply 1 application to skin folds 3 (three) times daily as needed 60 g 2   ondansetron (ZOFRAN) 8 MG tablet Take 1 tablet (8 mg total) by mouth every 8 (eight) hours as needed for nausea or vomiting. Start on the third day after chemotherapy. 30 tablet 1   potassium chloride SA (KLOR-CON M) 20 MEQ tablet Take 1  tablet (20 mEq total) by mouth 2 (two) times daily. Take 1 tablet 3 times daily for 3 days, then twice daily 60 tablet 1   prochlorperazine (COMPAZINE) 10 MG tablet Take 1 tablet (10 mg total) by mouth every 6 (six) hours as needed for nausea or vomiting. 30 tablet 1   No current facility-administered medications for this visit.   Facility-Administered Medications Ordered in Other Visits  Medication Dose Route Frequency Provider Last Rate Last Admin   fluorouracil (ADRUCIL) 5,700 mg in sodium chloride 0.9 % 136 mL chemo infusion  2,400 mg/m2 (Treatment Plan Recorded) Intravenous 1 day or 1 dose Malachy Mood, MD   Infusion Verify at 03/11/23 1551    PHYSICAL EXAMINATION: ECOG PERFORMANCE STATUS: 1 - Symptomatic but completely ambulatory  Vitals:   03/11/23 1108  BP: (!) 146/93  Pulse: 90  Resp: 16  Temp: 98 F (36.7 C)  SpO2: 98%   Wt Readings from Last 3 Encounters:  03/11/23 268 lb 14.4 oz (122 kg)  02/27/23 279 lb 8 oz (126.8 kg)  02/13/23 279 lb 6 oz (126.7 kg)     GENERAL:alert, no distress and comfortable SKIN: skin color, texture, turgor are normal, no rashes or significant lesions EYES: normal, Conjunctiva are pink and non-injected, sclera  clear NECK: supple, thyroid normal size, non-tender, without nodularity LYMPH:  no palpable lymphadenopathy in the cervical, axillary  LUNGS: clear to auscultation and percussion with normal breathing effort HEART: regular rate & rhythm and no murmurs and no lower extremity edema ABDOMEN:abdomen soft, non-tender and normal bowel sounds Musculoskeletal:no cyanosis of digits and no clubbing  NEURO: alert & oriented x 3 with fluent speech, no focal motor/sensory deficits    LABORATORY DATA:  I have reviewed the data as listed    Latest Ref Rng & Units 03/11/2023   10:46 AM 02/27/2023   10:11 AM 02/13/2023   11:48 AM  CBC  WBC 4.0 - 10.5 K/uL 7.9  5.8  5.7   Hemoglobin 12.0 - 15.0 g/dL 9.9  9.2  9.4   Hematocrit 36.0 - 46.0 % 31.2   29.0  29.5   Platelets 150 - 400 K/uL 362  281  283         Latest Ref Rng & Units 03/11/2023   10:46 AM 02/27/2023   10:11 AM 02/13/2023   11:48 AM  CMP  Glucose 70 - 99 mg/dL 956  213  086   BUN 6 - 20 mg/dL 11  11  11    Creatinine 0.44 - 1.00 mg/dL 5.78  4.69  6.29   Sodium 135 - 145 mmol/L 139  138  139   Potassium 3.5 - 5.1 mmol/L 3.8  3.6  4.1   Chloride 98 - 111 mmol/L 102  103  104   CO2 22 - 32 mmol/L 27  26  27    Calcium 8.9 - 10.3 mg/dL 9.4  9.2  9.2   Total Protein 6.5 - 8.1 g/dL 7.2  7.2  7.2   Total Bilirubin <1.2 mg/dL 0.3  0.4  0.3   Alkaline Phos 38 - 126 U/L 70  65  68   AST 15 - 41 U/L 13  13  14    ALT 0 - 44 U/L 11  11  14        RADIOGRAPHIC STUDIES: I have personally reviewed the radiological images as listed and agreed with the findings in the report. No results found.    No orders of the defined types were placed in this encounter.  All questions were answered. The patient knows to call the clinic with any problems, questions or concerns. No barriers to learning was detected. The total time spent in the appointment was 25 minutes.     Malachy Mood, MD 03/11/2023

## 2023-03-11 NOTE — Assessment & Plan Note (Signed)
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  The left descending colon cancer was only 4 mm, removed by polypectomy. The largest 3 cm, fungating mass in the rectosigmoid junction is 15 cm from anal verge on colonoscopy, biopsy confirmed moderate differentiated adenocarcinoma -I reviewed her staging CT chest from November 18, 2022, which showed no definitive evidence of metastasis, but showed borderline enlarged thoracic adenopathy.  I will obtain a PET scan for further evaluation. -Her liver MRI unfortunately showed a 1.2 cm mass in the posterior dome of liver, segment 7, biopsy on 12/03/22 confirmed metastasis from colon cancer, I reviewed with her.  -Given her metastatic disease, I recommend chemotherapy first.  She started FOLFOX on 12/18/2022 -She has been seen by colorectal surgeon Dr. Maisie Fus -plan to reevaluated after 3 months of chemo, and discuss surgical resection of the primary tumor and liver metastasis. -She is not a candidate for EGFR inhibitor, will hold beva for now due to pending surgeries.

## 2023-03-12 ENCOUNTER — Other Ambulatory Visit: Payer: Self-pay

## 2023-03-13 ENCOUNTER — Ambulatory Visit: Payer: 59 | Admitting: Hematology

## 2023-03-13 ENCOUNTER — Other Ambulatory Visit: Payer: 59

## 2023-03-13 ENCOUNTER — Ambulatory Visit: Payer: 59

## 2023-03-13 ENCOUNTER — Inpatient Hospital Stay: Payer: 59

## 2023-03-13 ENCOUNTER — Other Ambulatory Visit: Payer: Self-pay

## 2023-03-13 DIAGNOSIS — C186 Malignant neoplasm of descending colon: Secondary | ICD-10-CM

## 2023-03-13 DIAGNOSIS — Z5111 Encounter for antineoplastic chemotherapy: Secondary | ICD-10-CM | POA: Diagnosis not present

## 2023-03-13 MED ORDER — SODIUM CHLORIDE 0.9% FLUSH
10.0000 mL | INTRAVENOUS | Status: DC | PRN
  Administered 2023-03-13: 10 mL

## 2023-03-13 MED ORDER — HEPARIN SOD (PORK) LOCK FLUSH 100 UNIT/ML IV SOLN
500.0000 [IU] | Freq: Once | INTRAVENOUS | Status: AC | PRN
  Administered 2023-03-13: 500 [IU]

## 2023-03-18 ENCOUNTER — Ambulatory Visit: Payer: Self-pay | Admitting: General Surgery

## 2023-03-18 NOTE — H&P (Signed)
PROVIDER:  Elenora Gamma, MD  MRN: [id number] DOB: [birth year] DATE OF ENCOUNTER: 03/18/2023  Subjective   Chief Complaint: Wound Check (- Recheck colon cancer discuss sx)     History of Present Illness: Tammy Marks is a 57 y.o. female who is seen today as an office consultation at the request of Dr. Seymour Bars for evaluation of Wound Check (- Recheck colon cancer discuss sx) .  57 year old female who underwent screening colonoscopy in late June 2024.  She had noticed a small amount of bright red blood with bowel movements.  During colonoscopy a rectosigmoid mass was noted and biopsies were obtained.  The area was tattooed.  A 4 mm polyp was also found in the descending colon.  This was removed completely.  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  An invasive adenocarcinoma was noted within the rectosigmoid colon as well.  CT scans of the abdomen and pelvis show some enlarged regional lymph nodes as well as a proximal rectal mass.  There was a lesion in the liver that was unable to be characterized on CT scan.  MRI of the pelvis, MRI of the abdomen and CT chest have been ordered to complete metastatic workup.   Review of Systems: A complete review of systems was obtained from the patient.  I have reviewed this information and discussed as appropriate with the patient.  See HPI as well for other ROS.   Medical History: Past Medical History:  Diagnosis Date   Diabetes mellitus without complication (CMS/HHS-HCC)    History of cancer     There is no problem list on file for this patient.   Past Surgical History:  Procedure Laterality Date   CESAREAN SECTION     x2   CHOLECYSTECTOMY     HERNIA REPAIR       No Known Allergies  Current Outpatient Medications on File Prior to Visit  Medication Sig Dispense Refill   atorvastatin (LIPITOR) 20 MG tablet      glipiZIDE (GLUCOTROL XL) 10 MG XL tablet Take 10 mg by  mouth once daily     metFORMIN (GLUCOPHAGE-XR) 500 MG XR tablet TAKE 2 TABLETS BY MOUTH WITH A MEAL TWICE DAILY     No current facility-administered medications on file prior to visit.    Family History  Problem Relation Age of Onset   Skin cancer Mother    High blood pressure (Hypertension) Mother    Obesity Father    High blood pressure (Hypertension) Father    Coronary Artery Disease (Blocked arteries around heart) Father    Diabetes Father      Social History   Tobacco Use  Smoking Status Never  Smokeless Tobacco Never     Social History   Socioeconomic History   Marital status: Single  Tobacco Use   Smoking status: Never   Smokeless tobacco: Never  Vaping Use   Vaping status: Never Used  Substance and Sexual Activity   Alcohol use: Not Currently   Drug use: Never    Objective:    There were no vitals filed for this visit.    Exam Gen: NAD Abd: soft, vertical lower midline inc scar    Labs, Imaging and Diagnostic Testing: Most recent lab work reviewed.  Colonoscopy report and images reviewed.  Oncology note reviewed.  CT scan images and report reviewed.  Assessment and Plan:  Diagnoses and all orders for this visit:  Overlapping malignant neoplasm of colon (CMS/HHS-HCC) -  polyethylene glycol (MIRALAX) powder; Take 233.75 g by mouth once for 1 dose Take according to your procedure prep instructions. -     bisacodyL (DULCOLAX) 5 mg EC tablet; Take 4 tablets (20 mg total) by mouth once for 1 dose -     metroNIDAZOLE (FLAGYL) 500 MG tablet; Take 2 tablets (1,000 mg total) by mouth 3 (three) times daily for 6 doses Take according to your procedure colon prep instructions -     neomycin 500 mg tablet; Take 2 tablets (1,000 mg total) by mouth 3 (three) times daily for 3 doses Take according to your procedure colon prep instructions -     ondansetron (ZOFRAN) 4 MG tablet; Take 1 tablet (4 mg total) by mouth every 8 (eight) hours as needed for Nausea for up  to 7 days     57 year old obese female who presented to the office after colonoscopy due to rectal bleeding.  She was noted to have a rectosigmoid mass which was biopsied and tattooed.  Pathology shows adenocarcinoma.  She also had a descending colon polyp that was resected and removed that showed invasive adenocarcinoma within 1 mm of the margin.  This area was tattooed as well by Dr. Lorenso Quarry during a second flexible sigmoidoscopy.  She completed her metastatic workup and was found to have a mass in her liver in segment 7.  Biopsy confirmed metastatic disease.  She has underwent 3 months of chemotherapy.  Repeat imaging shows decrease in segment 7 hepatic lesion and decreased size of sigmoid mesenteric lymph nodes.  She is scheduled to undergo relation of her liver lesion later this month.  She is here today to discuss colon resection.  Given her synchronous tumors, she will most likely need a left colectomy.  We also discussed her diabetes management as her hemoglobin A1c was greater than 14.  Most recent was 8.9.  She seems to be doing much better with this.  The surgery and anatomy were described to the patient as well as the risks of surgery and the possible complications.  These include: Bleeding, deep abdominal infections and possible wound complications such as hernia and infection, damage to adjacent structures, leak of surgical connections, which can lead to other surgeries and possibly an ostomy, possible need for other procedures, such as abscess drains in radiology, possible prolonged hospital stay, possible diarrhea from removal of part of the colon, possible constipation from narcotics, possible bowel, bladder or sexual dysfunction if having rectal surgery, prolonged fatigue/weakness or appetite loss, possible early recurrence of of disease, possible complications of their medical problems such as heart disease or arrhythmias or lung problems, death (less than 1%). I believe the patient  understands and wishes to proceed with the surgery.   Vanita Panda, MD Colon and Rectal Surgery [facility]

## 2023-03-19 ENCOUNTER — Other Ambulatory Visit: Payer: Self-pay

## 2023-03-27 ENCOUNTER — Encounter: Payer: Self-pay | Admitting: Interventional Radiology

## 2023-03-27 ENCOUNTER — Ambulatory Visit
Admission: RE | Admit: 2023-03-27 | Discharge: 2023-03-27 | Disposition: A | Discharge status: Home / Self Care | Payer: 59 | Source: Ambulatory Visit | Attending: Hematology

## 2023-03-27 DIAGNOSIS — C186 Malignant neoplasm of descending colon: Secondary | ICD-10-CM

## 2023-03-27 HISTORY — PX: IR RADIOLOGIST EVAL & MGMT: IMG5224

## 2023-03-27 NOTE — Consult Note (Addendum)
Chief Complaint: Oligometastatic disease to liver, possible ablation  Referring Physician(s): Feng,Yan  Early, Sung Amabile, NP as PCP - General (Nurse Practitioner) Malachy Mood, MD as Consulting Physician (Hematology and Oncology) Romie Levee, MD as Consulting Physician (General Surgery) Lynann Bologna, DO as Consulting Physician (Gastroenterology)  History of Present Illness: Tammy Marks is a 57 y.o. female presenting as a referral to [facility], kindly referred by Dr. Mosetta Putt, to discuss her oligometastatic disease of colorectal primary.   Tammy Marks is here today by herself for the appointment.   Her history per Dr. Latanya Maudlin note 11/06/22: 1. Left colon cancer and rectosigmoid adenocarcinoma, MMR normal  -Patient presented with mild hematochezia, and had a screening colonoscopy.  I discussed the findings with patient in detail. -She has 2 synchronized colorectal cancer.  The left descending colon cancer was only 4 mm, removed by polypectomy, but she likely still need a left hemicolectomy due to the invasive nature of her cancer. -The largest 3 cm, fungating mass in the rectosigmoid junction is 15 cm from anal verge on colonoscopy, biopsy confirmed moderate differentiated adenocarcinoma  She was referred for biopsy of a liver lesion identified on CT and MRI 11/01/22 and 11/18/22.  Liver mass Biopsy was done by me 12/03/22: FINAL MICROSCOPIC DIAGNOSIS:   A. LIVER, NEEDLE CORE BIOPSY:  -  Metastatic moderately differentiated adenocarcinoma morphologically  consistent with the clinical suspicion of colorectal metastasis, see  note.   Tammy Marks says she has completed 6 cycles of chemotherapy with Dr. Mosetta Putt, and has surgery scheduled with Dr. Maisie Fus in January.   PET CT was done 12/26/2022, showing 2 right liver lesions.   Correlate on CT 03/05/23   The second lesion on PET:   This lesion does not have a correlate on CT 03/05/23, Does not have a correlate on the [place]  Does not have  a correlate on the prior MRI   Tammy Marks remains active, and continues to work.  She complains of some fatigue at the time of her every-2 week treatment, but just handles this with some naps.  She otherwise is doing all of her ADL's.  Her appetite is good, and she denies any N/V.   CEA 02/27/23 = 3.22, decreased from 6.01 and 27.14 of previous.   Past Medical History:  Diagnosis Date   Diabetes mellitus without complication (HCC)     Past Surgical History:  Procedure Laterality Date   CESAREAN SECTION     CHOLECYSTECTOMY     IR IMAGING GUIDED PORT INSERTION  12/03/2022   IR US LIVER BIOPSY  12/03/2022    Allergies: Patient has no known allergies.  Medications: Prior to Admission medications   Medication Sig Start Date End Date Taking? Authorizing Provider  atorvastatin (LIPITOR) 20 MG tablet Take 20 mg by mouth daily.    [provider]  azelastine (OPTIVAR) 0.05 % ophthalmic solution Place 1 drop into both eyes 2 (two) times daily. 05/29/21   Tollie Eth, NP  dexamethasone (DECADRON) 4 MG tablet Take 2 tablets (8 mg total) by mouth daily. Start the day after chemotherapy for 2 days. Take with food. 12/20/22   Malachy Mood, MD  Iron, Ferrous Sulfate, 325 (65 Fe) MG TABS Take 325 mg by mouth daily. 02/13/19   Grayce Sessions, NP  lidocaine-prilocaine (EMLA) cream Apply to affected area once 12/20/22   Malachy Mood, MD  nystatin (MYCOSTATIN/NYSTOP) powder Apply 1 application to skin folds 3 (three) times daily as needed 05/29/21  Tollie Eth, NP  ondansetron (ZOFRAN) 8 MG tablet Take 1 tablet (8 mg total) by mouth every 8 (eight) hours as needed for nausea or vomiting. Start on the third day after chemotherapy. 12/20/22   Malachy Mood, MD  potassium chloride SA (KLOR-CON M) 20 MEQ tablet Take 1 tablet (20 mEq total) by mouth 2 (two) times daily. Take 1 tablet 3 times daily for 3 days, then twice daily 01/29/23   Malachy Mood, MD  prochlorperazine (COMPAZINE) 10 MG tablet Take 1 tablet  (10 mg total) by mouth every 6 (six) hours as needed for nausea or vomiting. 12/20/22   Malachy Mood, MD     Family History  Problem Relation Age of Onset   Cancer Father 38       gastric cancer    Social History   Socioeconomic History   Marital status: Single    Spouse name: Not on file   Number of children: 2   Years of education: Not on file   Highest education level: Not on file  Occupational History   Not on file  Tobacco Use   Smoking status: Never   Smokeless tobacco: Never  Substance and Sexual Activity   Alcohol use: Not Currently   Drug use: Never   Sexual activity: Not Currently  Other Topics Concern   Not on file  Social History Narrative   Not on file   Social Drivers of Health   Financial Resource Strain: Not on file  Food Insecurity: Not on file  Transportation Needs: Not on file  Physical Activity: Not on file  Stress: Not on file  Social Connections: Not on file    ECOG Status: 1 - Symptomatic but completely ambulatory  Review of Systems: A 12 point ROS discussed and pertinent positives are indicated in the HPI above.  All other systems are negative.  Review of Systems  Vital Signs: BP (!) 141/78   Pulse 87   Temp 97.8 F (36.6 C)   Resp 20   LMP 06/20/2020 (Exact Date)   SpO2 99%     Physical Exam General: 57 yo female appearing stated age,obese.  Well-developed, well-nourished.  No distress. HEENT: Atraumatic, normocephalic.  Conjugate gaze, extra-ocular motor intact. No scleral icterus or scleral injection. No lesions on external ears, nose, lips, or gums.  Oral mucosa moist, pink.  Neck: Symmetric with no goiter enlargement.  Chest/Lungs:  Symmetric chest with inspiration/expiration.  No labored breathing.  Clear to auscultation with no wheezes, rhonchi, or rales.  Heart:  RRR, with no third heart sounds appreciated. No JVD appreciated.  Abdomen:  Soft, NT/ND, with + bowel sounds.   Genito-urinary: Deferred Neurologic: Alert &  Oriented to person, place, and time.   Normal affect and insight.  Appropriate questions.  Moving all 4 extremities with gross sensory intact.  Pulse Exam:  No bruit appreciated.  Palpable bilateral radial pulse and left ulnar pulse.       Mallampati Score:     Imaging: CT ABDOMEN PELVIS W CONTRAST Result Date: 03/07/2023 CLINICAL DATA:  History of left-sided colon cancer assess treatment response. * Tracking Code: BO * EXAM: CT ABDOMEN AND PELVIS WITH CONTRAST TECHNIQUE: Multidetector CT imaging of the abdomen and pelvis was performed using the standard protocol following bolus administration of intravenous contrast. RADIATION DOSE REDUCTION: This exam was performed according to the departmental dose-optimization program which includes automated exposure control, adjustment of the mA and/or kV according to patient size and/or use of iterative reconstruction technique. CONTRAST:  OMNIPAQUE IOHEXOL 300 MG/ML  SOLN COMPARISON:  Multiple priors including PET-CT December 26, 2022 and MRI pelvis November 24, 2022 FINDINGS: Lower chest: Partially visualized intracardiac catheter. Hepatobiliary: Segment VII hepatic metastasis measures 14 mm on image 22/2 previously 17 mm on CT November 01, 2022. The segment VIII hepatic metastasis seen on prior PET-CT is not readily evident on CT. No new suspicious hepatic lesions. Gallbladder surgically absent. No biliary ductal dilation. Pancreas: No pancreatic ductal dilation or evidence of acute inflammation. Spleen: No splenomegaly. Adrenals/Urinary Tract: Bilateral adrenal glands appear normal. No hydronephrosis. Kidneys demonstrate symmetric enhancement. Urinary bladder is unremarkable for degree of distension. Stomach/Bowel: Tiny hiatal hernia. Radiopaque enteric contrast material traverses distal loops of small bowel. Stomach is otherwise unremarkable for degree of distension. No pathologic dilation of small or large bowel. Sigmoid colon and rectum are decompressed  limiting evaluation for the known colonic mass. Decreased size of the adjacent lymph nodes in the sigmoid mesentery now measuring up to 5 mm previously 10 mm. Vascular/Lymphatic: Similar size of the iliac side chain/pelvic sidewall lymph nodes. Left external iliac lymph node measures 10 mm in short axis on image 81/2 unchanged when remeasured for consistency. Normal caliber abdominal aorta. Smooth IVC contours. The portal, splenic and superior mesenteric veins are patent. Reproductive: Uterus and bilateral adnexa are unremarkable. Other: No significant abdominopelvic free fluid. No discrete peritoneal or omental nodularity. Fat containing ventral hernia. Musculoskeletal: No aggressive lytic or blastic lesion of bone. IMPRESSION: 1. Decreased size of the segment VII hepatic. 2. Known colonic neoplasm not well seen due to nondistention of the bowel. 3. Decreased size of sigmoid mesentery lymph nodes. 4. Similar size of the iliac side chain/pelvic sidewall lymph nodes. Electronically Signed   By: Maudry Mayhew M.D.   On: 03/07/2023 10:24    Labs:  CBC: Recent Labs    01/29/23 0917 02/13/23 1148 02/27/23 1011 03/11/23 1046  WBC 7.5 5.7 5.8 7.9  HGB 9.8* 9.4* 9.2* 9.9*  HCT 31.6* 29.5* 29.0* 31.2*  PLT 284 283 281 362    COAGS: Recent Labs    12/03/22 0701  INR 1.1    BMP: Recent Labs    01/29/23 0917 02/13/23 1148 02/27/23 1011 03/11/23 1046  NA 138 139 138 139  K 3.9 4.1 3.6 3.8  CL 103 104 103 102  CO2 26 27 26 27   GLUCOSE 119* 102* 140* 132*  BUN 8 11 11 11   CALCIUM 9.6 9.2 9.2 9.4  CREATININE 0.72 0.65 0.69 0.64  GFRNONAA >60 >60 >60 >60    LIVER FUNCTION TESTS: Recent Labs    01/29/23 0917 02/13/23 1148 02/27/23 1011 03/11/23 1046  BILITOT 0.3 0.3 0.4 0.3  AST 23 14* 13* 13*  ALT 23 14 11 11   ALKPHOS 63 68 65 70  PROT 7.4 7.2 7.2 7.2  ALBUMIN 3.9 3.7 3.7 3.8    TUMOR MARKERS: Recent Labs    11/06/22 0904 01/29/23 0917 02/27/23 1011  CEA 27.14* 6.01*  3.22    Assessment and Plan:  Tammy Marks is 57 yo female with synchronous colon cancer and oligometastatic disease to the liver, with 2 right liver lesions on PET 03/05/23.  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  The left descending colon cancer was only 4 mm, removed by polypectomy. The largest 3 cm, fungating mass in the rectosigmoid junction is 15 cm from anal verge on colonoscopy, biopsy confirmed moderate differentiated adenocarcinoma  I  had a lengthy discussion with her regarding the palliative role of VIR in the setting of liver metastasis, in particular the application of ablation therapy to treat liver mets.   Regarding the lesion of segment VII (the lesion that we did biopsy in August) I think the best way to treat will be microwave ablation, and this should be approachable.  We discussed image guided microwave ablation with general anesthesia.    Regarding the second lesion of segment VIII, this lesion will not be treatable with ablation.  This small lesion on PET, with no CT correlate.  There is no prior MRI correlate, and there was no correlate on the prior US.  I did let her know that we will not be able to treat this lesion.   Risks of ablation were discussed, including: bleeding, infection, injury to adjacent structure, organ injury, hospitalization, need for further procedure/surgery, anesthesia risks, tumor lysis syndrome, cardiopulmonary collapse, death.   After our discussion, she would like to proceed with ablation therapy.   Plan: - plan for image guided (CT & [place]) microwave ablation of the segment 7 liver lesion with Dr. Loreta Ave at [facility]  General anesthesia - the segment 8 lesion will need to be observed, as there is no correlate for image guided treatment.  - continue current care    Thank you for this interesting consult.  I greatly enjoyed meeting Tammy Marks and look forward  to participating in their care.  A copy of this report was sent to the requesting provider on this date.  Electronically Signed: Gilmer Mor 03/27/2023, 1:46 PM   I spent a total of  60 Minutes   in face to face in clinical consultation, greater than 50% of which was counseling/coordinating care for colon cancer, oligometastatic disease to the liver, possible image guided microwave ablation

## 2023-03-28 ENCOUNTER — Inpatient Hospital Stay: Payer: 59

## 2023-03-28 ENCOUNTER — Inpatient Hospital Stay: Payer: 59 | Admitting: Physician Assistant

## 2023-03-28 ENCOUNTER — Inpatient Hospital Stay (HOSPITAL_BASED_OUTPATIENT_CLINIC_OR_DEPARTMENT_OTHER): Payer: 59 | Admitting: Nurse Practitioner

## 2023-03-28 VITALS — BP 152/69 | HR 91 | Resp 18

## 2023-03-28 VITALS — BP 147/75 | HR 96 | Resp 16

## 2023-03-28 VITALS — BP 137/82 | HR 86 | Temp 97.8°F | Resp 15 | Wt 271.6 lb

## 2023-03-28 DIAGNOSIS — D649 Anemia, unspecified: Secondary | ICD-10-CM

## 2023-03-28 DIAGNOSIS — C19 Malignant neoplasm of rectosigmoid junction: Secondary | ICD-10-CM

## 2023-03-28 DIAGNOSIS — C186 Malignant neoplasm of descending colon: Secondary | ICD-10-CM

## 2023-03-28 DIAGNOSIS — T50905A Adverse effect of unspecified drugs, medicaments and biological substances, initial encounter: Secondary | ICD-10-CM

## 2023-03-28 DIAGNOSIS — Z95828 Presence of other vascular implants and grafts: Secondary | ICD-10-CM

## 2023-03-28 DIAGNOSIS — Z5111 Encounter for antineoplastic chemotherapy: Secondary | ICD-10-CM | POA: Diagnosis not present

## 2023-03-28 LAB — CMP (CANCER CENTER ONLY)
ALT: 11 U/L (ref 0–44)
AST: 12 U/L — ABNORMAL LOW (ref 15–41)
Albumin: 3.9 g/dL (ref 3.5–5.0)
Alkaline Phosphatase: 73 U/L (ref 38–126)
Anion gap: 10 (ref 5–15)
BUN: 12 mg/dL (ref 6–20)
CO2: 26 mmol/L (ref 22–32)
Calcium: 9.3 mg/dL (ref 8.9–10.3)
Chloride: 103 mmol/L (ref 98–111)
Creatinine: 0.66 mg/dL (ref 0.44–1.00)
GFR, Estimated: >60 mL/min (ref >60)
Glucose, Bld: 144 mg/dL — ABNORMAL HIGH (ref 70–99)
Potassium: 3.9 mmol/L (ref 3.5–5.1)
Sodium: 139 mmol/L (ref 135–145)
Total Bilirubin: 0.4 mg/dL (ref <1.2)
Total Protein: 7.1 g/dL (ref 6.5–8.1)

## 2023-03-28 LAB — CBC WITH DIFFERENTIAL (CANCER CENTER ONLY)
Abs Immature Granulocytes: 0.02 10*3/uL (ref 0.00–0.07)
Basophils Absolute: 0 10*3/uL (ref 0.0–0.1)
Basophils Relative: 1 %
Eosinophils Absolute: 0.1 10*3/uL (ref 0.0–0.5)
Eosinophils Relative: 1 %
HCT: 29.5 % — ABNORMAL LOW (ref 36.0–46.0)
Hemoglobin: 9.3 g/dL — ABNORMAL LOW (ref 12.0–15.0)
Immature Granulocytes: 0 %
Lymphocytes Relative: 38 %
Lymphs Abs: 2.3 10*3/uL (ref 0.7–4.0)
MCH: 24 pg — ABNORMAL LOW (ref 26.0–34.0)
MCHC: 31.5 g/dL (ref 30.0–36.0)
MCV: 76.2 fL — ABNORMAL LOW (ref 80.0–100.0)
Monocytes Absolute: 0.8 10*3/uL (ref 0.1–1.0)
Monocytes Relative: 13 %
Neutro Abs: 2.8 10*3/uL (ref 1.7–7.7)
Neutrophils Relative %: 47 %
Platelet Count: 247 10*3/uL (ref 150–400)
RBC: 3.87 MIL/uL (ref 3.87–5.11)
RDW: 25.1 % — ABNORMAL HIGH (ref 11.5–15.5)
WBC Count: 6 10*3/uL (ref 4.0–10.5)
nRBC: 0 % (ref 0.0–0.2)

## 2023-03-28 LAB — FERRITIN: Ferritin: 169 ng/mL (ref 11–307)

## 2023-03-28 LAB — CEA (ACCESS): CEA (CHCC): 2.04 ng/mL (ref 0.00–5.00)

## 2023-03-28 MED ORDER — LEUCOVORIN CALCIUM INJECTION 350 MG
400.0000 mg/m2 | Freq: Once | INTRAVENOUS | Status: AC
  Administered 2023-03-28: 948 mg via INTRAVENOUS
  Filled 2023-03-28: qty 47.4

## 2023-03-28 MED ORDER — DEXTROSE 5 % IV SOLN
85.0000 mg/m2 | Freq: Once | INTRAVENOUS | Status: AC
  Administered 2023-03-28: 200 mg via INTRAVENOUS
  Filled 2023-03-28: qty 40

## 2023-03-28 MED ORDER — SODIUM CHLORIDE 0.9 % IV SOLN
2400.0000 mg/m2 | INTRAVENOUS | Status: DC
  Administered 2023-03-28: 5700 mg via INTRAVENOUS
  Filled 2023-03-28: qty 114

## 2023-03-28 MED ORDER — PALONOSETRON HCL INJECTION 0.25 MG/5ML
0.2500 mg | Freq: Once | INTRAVENOUS | Status: AC
  Administered 2023-03-28: 0.25 mg via INTRAVENOUS
  Filled 2023-03-28: qty 5

## 2023-03-28 MED ORDER — SODIUM CHLORIDE 0.9% FLUSH
10.0000 mL | Freq: Once | INTRAVENOUS | Status: AC
  Administered 2023-03-28: 10 mL

## 2023-03-28 MED ORDER — DEXTROSE 5 % IV SOLN: Freq: Once | INTRAVENOUS | Status: AC

## 2023-03-28 MED ORDER — METHYLPREDNISOLONE SODIUM SUCC 40 MG IJ SOLR
40.0000 mg | Freq: Once | INTRAMUSCULAR | Status: AC
Start: 2023-03-28 — End: 2023-03-28
  Administered 2023-03-28: 40 mg via INTRAVENOUS

## 2023-03-28 MED ORDER — SODIUM CHLORIDE 0.9% FLUSH
10.0000 mL | INTRAVENOUS | Status: DC | PRN
Start: 2023-03-28 — End: 2023-03-28

## 2023-03-28 MED ORDER — FAMOTIDINE IN NACL 20-0.9 MG/50ML-% IV SOLN
20.0000 mg | Freq: Once | INTRAVENOUS | Status: AC | PRN
  Administered 2023-03-28: 20 mg via INTRAVENOUS

## 2023-03-28 MED ORDER — DEXAMETHASONE SODIUM PHOSPHATE 10 MG/ML IJ SOLN
10.0000 mg | Freq: Once | INTRAMUSCULAR | Status: AC
  Administered 2023-03-28: 10 mg via INTRAVENOUS
  Filled 2023-03-28: qty 1

## 2023-03-28 MED ORDER — FLUOROURACIL CHEMO INJECTION 2.5 GM/50ML
400.0000 mg/m2 | Freq: Once | INTRAVENOUS | Status: AC
Start: 2023-03-28 — End: 2023-03-28
  Administered 2023-03-28: 1000 mg via INTRAVENOUS
  Filled 2023-03-28: qty 20

## 2023-03-28 NOTE — Assessment & Plan Note (Addendum)
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  The left descending colon cancer was only 4 mm, removed by polypectomy. The largest 3 cm, fungating mass in the rectosigmoid junction is 15 cm from anal verge on colonoscopy, biopsy confirmed moderate differentiated adenocarcinoma -Her staging CT chest from November 18, 2022 was reviewed, which showed no definitive evidence of metastasis, but showed borderline enlarged thoracic adenopathy.  Will obtain a PET scan for further evaluation. -Her liver MRI unfortunately showed a 1.2 cm mass in the posterior dome of liver, segment 7, biopsy on 12/03/22 confirmed metastasis from colon cancer. This was reviewed with her.  -Given her metastatic disease, chemotherapy is recommended first.  She started FOLFOX on 12/18/2022 -She has been seen by colorectal surgeon Dr. Maisie Fus -plan to reevaluation after 3 months of chemo, and discuss surgical resection of the primary tumor and liver metastasis. -She is not a candidate for EGFR inhibitor, will hold beva for now due to pending surgeries.  -Will likely have radioablation to the spot on liver during the first or second week of January. -Scheduled for surgery with Dr. Maisie Fus 05/05/2023. -Of 19 2024 -proceed with cycle 8 day 1 FOLFOX.

## 2023-03-28 NOTE — Patient Instructions (Signed)
[facility] - A DEPT OF [facility]  Discharge Instructions: Thank you for choosing [facility] to provide your oncology and hematology care.   If you have a lab appointment with the Cancer Center, please go directly to the Cancer Center and check in at the registration area.   Wear comfortable clothing and clothing appropriate for easy access to any Portacath or PICC line.   We strive to give you quality time with your provider. You may need to reschedule your appointment if you arrive late (15 or more minutes).  Arriving late affects you and other patients whose appointments are after yours.  Also, if you miss three or more appointments without notifying the office, you may be dismissed from the clinic at the provider's discretion.      For prescription refill requests, have your pharmacy contact our office and allow 72 hours for refills to be completed.    Today you received the following chemotherapy and/or immunotherapy agents: Oxaliplatin/Leucovorin/Adrucil.      To help prevent nausea and vomiting after your treatment, we encourage you to take your nausea medication as directed.  BELOW ARE SYMPTOMS THAT SHOULD BE REPORTED IMMEDIATELY: *FEVER GREATER THAN 100.4 F (38 C) OR HIGHER *CHILLS OR SWEATING *NAUSEA AND VOMITING THAT IS NOT CONTROLLED WITH YOUR NAUSEA MEDICATION *UNUSUAL SHORTNESS OF BREATH *UNUSUAL BRUISING OR BLEEDING *URINARY PROBLEMS (pain or burning when urinating, or frequent urination) *BOWEL PROBLEMS (unusual diarrhea, constipation, pain near the anus) TENDERNESS IN MOUTH AND THROAT WITH OR WITHOUT PRESENCE OF ULCERS (sore throat, sores in mouth, or a toothache) UNUSUAL RASH, SWELLING OR PAIN  UNUSUAL VAGINAL DISCHARGE OR ITCHING   Items with * indicate a potential emergency and should be followed up as soon as possible or go to the Emergency Department if any problems should occur.  Please show the CHEMOTHERAPY ALERT  CARD or IMMUNOTHERAPY ALERT CARD at check-in to the Emergency Department and triage nurse.  Should you have questions after your visit or need to cancel or reschedule your appointment, please contact [facility] - A DEPT OF [facility]  Dept: [phone]  and follow the prompts.  Office hours are 8:00 a.m. to 4:30 p.m. Monday - Friday. Please note that voicemails left after 4:00 p.m. may not be returned until the following business day.  We are closed weekends and major holidays. You have access to a nurse at all times for urgent questions. Please call the main number to the clinic Dept: [phone] and follow the prompts.   For any non-urgent questions, you may also contact your provider using MyChart. We now offer e-Visits for anyone 64 and older to request care online for non-urgent symptoms. For details visit [email or web address].   Also download the MyChart app! Go to the app store, search "MyChart", open the app, select , and log in with your MyChart username and password.

## 2023-03-28 NOTE — Progress Notes (Signed)
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  The left descending colon cancer was only 4 mm, removed by polypectomy. The largest 3 cm, fungating mass in the rectosigmoid junction is 15 cm from anal verge on colonoscopy, biopsy confirmed moderate differentiated adenocarcinoma -Her staging CT chest from November 18, 2022 was reviewed, which showed no definitive evidence of metastasis, but showed borderline enlarged thoracic adenopathy.  Will obtain a PET scan for further evaluation. -Her liver MRI unfortunately showed a 1.2 cm mass in the posterior dome of liver, segment 7, biopsy on 12/03/22 confirmed metastasis from colon cancer. This was reviewed with her.  -Given her metastatic disease, chemotherapy is recommended first.  She started FOLFOX on 12/18/2022 -She has been seen by colorectal surgeon Dr. Maisie Fus -plan to reevaluation after 3 months of chemo, and discuss surgical resection of the primary tumor and liver metastasis. -She is not a candidate for EGFR inhibitor, will hold beva for now due to pending surgeries.  -Will likely have radioablation to the spot on liver during the first or second week of January. -Scheduled for surgery with Dr. Maisie Fus 05/05/2023. -Of 19 2024 -proceed with cycle 8 day 1 FOLFOX.   Plan: The patient is seen in infusion. Labs reviewed  -CBC showing WBC 6.0; Hgb 9.3; Hct 29.5; Plt 247; Anc 2.8 -CMP - K 3.9; glucose 144; BUN 12; Creatinine 0.66; eGFR > 60; Ca 9.3; LFTs normal.   -Ferritin  -169 -CEA -2.04 Today is cycle 8 day 1 of FOLFOX. Scheduled to have radioablation of 1 spot on the liver sometime during first or second week of January. Clinically, patient doing well.  Okay to proceed with treatment. Labs//, follow-up, and treatment as scheduled.  The patient understands the plans discussed today and is in agreement with them.  She knows to contact our office if she develops concerns prior to her next appointment.  I provided 20 minutes of face-to-face time during this encounter and > 50% was spent counseling as documented under my assessment and plan.    Carlean Jews, NP  [facility] - A DEPT OF [facility] [address] [place] [address] Dept: [phone] Dept Fax: [phone]   No orders of the defined types were placed in this encounter.     CHIEF COMPLAINT:  CC: cancer of left colon  Current Treatment:  FOLFOX  INTERVAL HISTORY:  Attison is here today for repeat clinical assessment. Was last seen by Dr. Mosetta Putt on 03/11/2023. Has had consultation with radiation oncology.  Will likely have radioablation procedure to spot on liver during first or second week of January.  Surgery scheduled with Dr. Maisie Fus on 05/03/2023.  She denies chest pain, chest pressure, or shortness of breath. She denies headaches or visual disturbances. She denies abdominal pain, nausea, vomiting, or changes in bowel or bladder habits.  She denies fevers or chills. She denies pain. Her appetite is good. Her weight has increased 3 pounds over last 3 weeks .  I have reviewed the past medical  history, past surgical history, social history and family history with the patient and they are unchanged from previous note.  ALLERGIES:  has no known allergies.  MEDICATIONS:  Current Outpatient Medications  Medication Sig Dispense Refill   atorvastatin (LIPITOR) 20 MG tablet Take 20 mg by mouth daily.     azelastine (OPTIVAR)  0.05 % ophthalmic solution Place 1 drop into both eyes 2 (two) times daily. 6 mL 12   dexamethasone (DECADRON) 4 MG tablet Take 2 tablets (8 mg total) by mouth daily. Start the day after chemotherapy for 2 days. Take with food. 30 tablet 1   Iron, Ferrous Sulfate, 325 (65 Fe) MG TABS Take 325 mg by mouth daily. 30 tablet 11   lidocaine-prilocaine (EMLA) cream Apply to affected area once 30 g 3   nystatin (MYCOSTATIN/NYSTOP) powder Apply 1 application to skin folds 3 (three) times daily as needed 60 g 2   ondansetron (ZOFRAN) 8 MG tablet Take 1 tablet (8 mg total) by mouth every 8 (eight) hours as needed for nausea or vomiting. Start on the third day after chemotherapy. 30 tablet 1   potassium chloride SA (KLOR-CON M) 20 MEQ tablet Take 1 tablet (20 mEq total) by mouth 2 (two) times daily. Take 1 tablet 3 times daily for 3 days, then twice daily 60 tablet 1   prochlorperazine (COMPAZINE) 10 MG tablet Take 1 tablet (10 mg total) by mouth every 6 (six) hours as needed for nausea or vomiting. 30 tablet 1   No current facility-administered medications for this visit.    HISTORY OF PRESENT ILLNESS:   Oncology History  Cancer of left colon (HCC)  11/21/2022 Initial Diagnosis   Cancer of left colon (HCC)   12/18/2022 -  Chemotherapy   Patient is on Treatment Plan : COLORECTAL FOLFOX q14d     12/26/2022 PET scan   NM PET skull base to thigh  IMPRESSION: 1. Two hypermetabolic liver lesions consistent with metastatic disease. 2. Small hypermetabolic celiac axis and sigmoid mesocolon nodes suggesting metastatic adenopathy. 3. No findings for metastatic disease involving the chest or bony structures. 4. Diffuse hypermetabolism throughout the colon possibly related to recently eating or taking insulin making it difficult to identified the patient's sigmoid cancer.       REVIEW OF SYSTEMS:   Constitutional: Denies fevers, chills or abnormal weight loss Eyes: Denies blurriness of vision Ears, nose,  mouth, throat, and face: Denies mucositis or sore throat Respiratory: Denies cough, dyspnea or wheezes Cardiovascular: Denies palpitation, chest discomfort or lower extremity swelling Gastrointestinal:  Denies nausea, heartburn or change in bowel habits Skin: Denies abnormal skin rashes Lymphatics: Denies new lymphadenopathy or easy bruising Neurological:Denies numbness, tingling or new weaknesses Behavioral/Psych: Mood is stable, no new changes  All other systems were reviewed with the patient and are negative.   VITALS:   Today's Vitals   03/28/23 1130  BP: 137/82  Pulse: 86  Resp: 15  Temp: 97.8 F (36.6 C)  TempSrc: Temporal  SpO2: 100%  Weight: 271 lb 9.6 oz (123.2 kg)   Body mass index is 45.2 kg/m.   Wt Readings from Last 3 Encounters:  03/28/23 271 lb 9.6 oz (123.2 kg)  03/11/23 268 lb 14.4 oz (122 kg)  02/27/23 279 lb 8 oz (126.8 kg)    Body mass index is 45.2 kg/m.  Performance status (ECOG): 1 - Symptomatic but completely ambulatory  PHYSICAL EXAM:   GENERAL:alert, no distress and comfortable SKIN: skin color, texture, turgor are normal, no rashes or  significant lesions EYES: normal, Conjunctiva are pink and non-injected, sclera clear OROPHARYNX:no exudate, no erythema and lips, buccal mucosa, and tongue normal  NECK: supple, thyroid normal size, non-tender, without nodularity LYMPH:  no palpable lymphadenopathy in the cervical, axillary or inguinal LUNGS: clear to auscultation and percussion with normal breathing effort HEART: regular rate & rhythm and no murmurs and no lower extremity edema ABDOMEN:abdomen soft, non-tender and normal bowel sounds Musculoskeletal:no cyanosis of digits and no clubbing  NEURO: alert & oriented x 3 with fluent speech, no focal motor/sensory deficits  LABORATORY DATA:  I have reviewed the data as listed    Component Value Date/Time   NA 139 03/28/2023 1009   NA 136 05/29/2021 1318   K 3.9 03/28/2023 1009   CL 103  03/28/2023 1009   CO2 26 03/28/2023 1009   GLUCOSE 144 (H) 03/28/2023 1009   BUN 12 03/28/2023 1009   BUN 7 05/29/2021 1318   CREATININE 0.66 03/28/2023 1009   CALCIUM 9.3 03/28/2023 1009   PROT 7.1 03/28/2023 1009   PROT 7.4 05/29/2021 1318   ALBUMIN 3.9 03/28/2023 1009   ALBUMIN 4.1 05/29/2021 1318   AST 12 (L) 03/28/2023 1009   ALT 11 03/28/2023 1009   ALKPHOS 73 03/28/2023 1009   BILITOT 0.4 03/28/2023 1009   GFRNONAA >60 03/28/2023 1009   GFRAA >60 02/24/2019 1647    Lab Results  Component Value Date   WBC 6.0 03/28/2023   NEUTROABS 2.8 03/28/2023   HGB 9.3 (L) 03/28/2023   HCT 29.5 (L) 03/28/2023   MCV 76.2 (L) 03/28/2023   PLT 247 03/28/2023     RADIOGRAPHIC STUDIES: IR Radiologist Eval & Mgmt Result Date: 03/27/2023 EXAM: NEW PATIENT OFFICE VISIT CHIEF COMPLAINT: Electronic medical record HISTORY OF PRESENT ILLNESS: Electronic medical record REVIEW OF SYSTEMS: Electronic medical record PHYSICAL EXAMINATION: Electronic medical record ASSESSMENT AND PLAN: Electronic medical record Electronically Signed   By: Gilmer Mor D.O.   On: 03/27/2023 14:13

## 2023-03-28 NOTE — Progress Notes (Signed)
Hypersensitivity Reaction note  Date of event: 03/28/23 Time of event: 1320 Generic name of drug involved: oxaliplatin Name of provider notified of the hypersensitivity reaction: Karie Fetch., PA Was agent that likely caused hypersensitivity reaction added to Allergies List within EMR? Yes  Chain of events including reaction signs/symptoms, treatment administered, and outcome (e.g., drug resumed; drug discontinued; sent to Emergency Department; etc.)   At approximately 1320, RN noticed patient coughing and clearing throat.  Patient stated mouth and lips were "itchy".  RN stopped chemotherapy and applied warm towels/blankets around patients upper body.  VSS.  Karie Fetch., PA notified of situation.  After a few minutes, patient went to the restroom where the coughing continued.  Once patient returned to the infusion chair, patient complained of "feeling like I need to burp" but itching of mouth/lips resolved.  Pepcid given/completed at 1352 per verbal order.  At approximately 1415, patient notified PA and RN of relief from burp feeling.  Verbal order received for solu-medrol 40mg  - given at 1418.  RN instructed by Karie Fetch to resume oxaliplatin at a slower rate and notify of any other issues. Infusion resumed at 1418 and completed with no other complaints or concerns.  Dorann Lodge, RN 03/28/2023 2:31 PM

## 2023-03-28 NOTE — Progress Notes (Addendum)
Per Vincent Gros, NP okay to increase rate of 5FU pump to infuse over 45 hours.

## 2023-03-28 NOTE — Progress Notes (Signed)
    DATE:  03/28/23                                        X CHEMO/IMMUNOTHERAPY REACTION             MD: Mosetta Putt   AGENT/BLOOD PRODUCT RECEIVING TODAY:              Oxaliplatin       Vitals:   03/28/23 1350 03/28/23 1355  BP: (!) 144/75 (!) 152/69  Pulse: 92 91  Resp: 16 18  SpO2: 100% 100%      REACTION(S):           cough, chest pressure   PREMEDS:     Decadron 10 mg IV, Aloxi 0.25 mg IV   INTERVENTION: Pepcid 20 mg IV, solu-medrol 40 mg IV   Review of Systems  Review of Systems  HENT:  Positive for sore throat.   Respiratory:  Positive for choking. Negative for shortness of breath and wheezing.   Cardiovascular:  Positive for chest pain (pressure).     Physical Exam  Physical Exam Vitals and nursing note reviewed.  Constitutional:      Appearance: She is not ill-appearing or toxic-appearing.  HENT:     Head: Normocephalic.     Mouth/Throat:     Mouth: No angioedema.  Eyes:     Conjunctiva/sclera: Conjunctivae normal.  Cardiovascular:     Rate and Rhythm: Normal rate and regular rhythm.     Pulses: Normal pulses.     Heart sounds: Normal heart sounds.  Pulmonary:     Effort: Pulmonary effort is normal.     Breath sounds: Normal breath sounds. No stridor. No wheezing or rhonchi.  Abdominal:     General: There is no distension.  Musculoskeletal:     Cervical back: Normal range of motion.  Skin:    General: Skin is warm and dry.  Neurological:     Mental Status: She is alert.    OUTCOME:                Patient became symptomatic approximately 40 minutes into infusion. Emergency medications were administered as documented above. Patient returned to baseline. Discussed with patient that with reaction was mild and we can re challenge. She is agreeable. Rate was slowed when restarting. Patient tolerated remainder of treatment. Will add pre medications of pepcid and solu-medrol to future treatments. Medical oncology team made aware.

## 2023-03-29 ENCOUNTER — Other Ambulatory Visit: Payer: Self-pay | Admitting: Nurse Practitioner

## 2023-03-30 ENCOUNTER — Inpatient Hospital Stay: Payer: 59

## 2023-03-30 VITALS — BP 139/76 | HR 89 | Temp 99.7°F | Resp 18

## 2023-03-30 DIAGNOSIS — Z5111 Encounter for antineoplastic chemotherapy: Secondary | ICD-10-CM | POA: Diagnosis not present

## 2023-03-30 DIAGNOSIS — C186 Malignant neoplasm of descending colon: Secondary | ICD-10-CM

## 2023-03-30 MED ORDER — SODIUM CHLORIDE 0.9% FLUSH
10.0000 mL | INTRAVENOUS | Status: DC | PRN
Start: 2023-03-30 — End: 2023-03-30
  Administered 2023-03-30: 10 mL

## 2023-03-30 MED ORDER — HEPARIN SOD (PORK) LOCK FLUSH 100 UNIT/ML IV SOLN
500.0000 [IU] | Freq: Once | INTRAVENOUS | Status: AC | PRN
Start: 2023-03-30 — End: 2023-03-30
  Administered 2023-03-30: 500 [IU]

## 2023-04-02 ENCOUNTER — Other Ambulatory Visit: Payer: Self-pay

## 2023-04-03 ENCOUNTER — Other Ambulatory Visit: Payer: Self-pay

## 2023-04-04 ENCOUNTER — Other Ambulatory Visit (HOSPITAL_COMMUNITY): Payer: Self-pay | Admitting: Interventional Radiology

## 2023-04-04 DIAGNOSIS — K769 Liver disease, unspecified: Secondary | ICD-10-CM

## 2023-04-07 ENCOUNTER — Encounter: Payer: Self-pay | Admitting: Nurse Practitioner

## 2023-04-07 ENCOUNTER — Encounter: Payer: Self-pay | Admitting: Hematology

## 2023-04-11 ENCOUNTER — Encounter: Payer: Self-pay | Admitting: *Deleted

## 2023-04-11 ENCOUNTER — Encounter: Payer: Self-pay | Admitting: Hematology

## 2023-04-11 ENCOUNTER — Other Ambulatory Visit: Payer: Self-pay | Admitting: Nurse Practitioner

## 2023-04-11 ENCOUNTER — Other Ambulatory Visit: Payer: Self-pay | Admitting: Hematology

## 2023-04-11 DIAGNOSIS — C186 Malignant neoplasm of descending colon: Secondary | ICD-10-CM

## 2023-04-13 ENCOUNTER — Encounter: Payer: Self-pay | Admitting: Hematology

## 2023-04-14 ENCOUNTER — Other Ambulatory Visit: Payer: Self-pay

## 2023-04-15 ENCOUNTER — Other Ambulatory Visit: Payer: Self-pay

## 2023-04-17 NOTE — Assessment & Plan Note (Addendum)
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  The left descending colon cancer was only 4 mm, removed by polypectomy. The largest 3 cm, fungating mass in the rectosigmoid junction is 15 cm from anal verge on colonoscopy, biopsy confirmed moderate differentiated adenocarcinoma -I reviewed her staging CT chest from November 18, 2022, which showed no definitive evidence of metastasis, but showed borderline enlarged thoracic adenopathy.  I will obtain a PET scan for further evaluation. -Her liver MRI unfortunately showed a 1.2 cm mass in the posterior dome of liver, segment 7, biopsy on 12/03/22 confirmed metastasis from colon cancer, I reviewed with her.  -Given her metastatic disease, I recommend chemotherapy first.  She started FOLFOX on 12/18/2022 -She has been seen by colorectal surgeon Dr. Debby -plan to reevaluated after 3 months of chemo, and discuss surgical resection of the primary tumor and liver metastasis. -She is not a candidate for EGFR inhibitor, will hold beva for now due to pending surgeries.  -she is scheduled for hemicolectomy on 05/03/23 and liver ablation on 05/22/23 by IR

## 2023-04-18 ENCOUNTER — Encounter: Payer: Self-pay | Admitting: Hematology

## 2023-04-18 ENCOUNTER — Inpatient Hospital Stay: Payer: 59

## 2023-04-18 ENCOUNTER — Inpatient Hospital Stay: Payer: 59 | Attending: Hematology | Admitting: Hematology

## 2023-04-18 VITALS — BP 154/83 | HR 95 | Temp 98.8°F | Resp 16 | Wt 277.8 lb

## 2023-04-18 DIAGNOSIS — C186 Malignant neoplasm of descending colon: Secondary | ICD-10-CM

## 2023-04-18 DIAGNOSIS — C19 Malignant neoplasm of rectosigmoid junction: Secondary | ICD-10-CM | POA: Insufficient documentation

## 2023-04-18 DIAGNOSIS — D500 Iron deficiency anemia secondary to blood loss (chronic): Secondary | ICD-10-CM

## 2023-04-18 DIAGNOSIS — C787 Secondary malignant neoplasm of liver and intrahepatic bile duct: Secondary | ICD-10-CM | POA: Diagnosis not present

## 2023-04-18 DIAGNOSIS — D649 Anemia, unspecified: Secondary | ICD-10-CM

## 2023-04-18 DIAGNOSIS — Z95828 Presence of other vascular implants and grafts: Secondary | ICD-10-CM

## 2023-04-18 LAB — CMP (CANCER CENTER ONLY)
ALT: 11 U/L (ref 0–44)
AST: 13 U/L — ABNORMAL LOW (ref 15–41)
Albumin: 3.7 g/dL (ref 3.5–5.0)
Alkaline Phosphatase: 73 U/L (ref 38–126)
Anion gap: 8 (ref 5–15)
BUN: 10 mg/dL (ref 6–20)
CO2: 28 mmol/L (ref 22–32)
Calcium: 9.3 mg/dL (ref 8.9–10.3)
Chloride: 102 mmol/L (ref 98–111)
Creatinine: 0.61 mg/dL (ref 0.44–1.00)
GFR, Estimated: >60 mL/min (ref >60)
Glucose, Bld: 155 mg/dL — ABNORMAL HIGH (ref 70–99)
Potassium: 3.8 mmol/L (ref 3.5–5.1)
Sodium: 138 mmol/L (ref 135–145)
Total Bilirubin: 0.4 mg/dL (ref 0.0–1.2)
Total Protein: 7.2 g/dL (ref 6.5–8.1)

## 2023-04-18 LAB — CBC WITH DIFFERENTIAL (CANCER CENTER ONLY)
Abs Immature Granulocytes: 0.02 10*3/uL (ref 0.00–0.07)
Basophils Absolute: 0 10*3/uL (ref 0.0–0.1)
Basophils Relative: 0 %
Eosinophils Absolute: 0.1 10*3/uL (ref 0.0–0.5)
Eosinophils Relative: 1 %
HCT: 28.8 % — ABNORMAL LOW (ref 36.0–46.0)
Hemoglobin: 9.3 g/dL — ABNORMAL LOW (ref 12.0–15.0)
Immature Granulocytes: 0 %
Lymphocytes Relative: 37 %
Lymphs Abs: 2.8 10*3/uL (ref 0.7–4.0)
MCH: 25.5 pg — ABNORMAL LOW (ref 26.0–34.0)
MCHC: 32.3 g/dL (ref 30.0–36.0)
MCV: 79.1 fL — ABNORMAL LOW (ref 80.0–100.0)
Monocytes Absolute: 0.7 10*3/uL (ref 0.1–1.0)
Monocytes Relative: 9 %
Neutro Abs: 4 10*3/uL (ref 1.7–7.7)
Neutrophils Relative %: 53 %
Platelet Count: 348 10*3/uL (ref 150–400)
RBC: 3.64 MIL/uL — ABNORMAL LOW (ref 3.87–5.11)
RDW: 22.5 % — ABNORMAL HIGH (ref 11.5–15.5)
WBC Count: 7.6 10*3/uL (ref 4.0–10.5)
nRBC: 0 % (ref 0.0–0.2)

## 2023-04-18 LAB — FERRITIN: Ferritin: 162 ng/mL (ref 11–307)

## 2023-04-18 LAB — CEA (ACCESS): CEA (CHCC): 1.64 ng/mL (ref 0.00–5.00)

## 2023-04-18 MED ORDER — HEPARIN SOD (PORK) LOCK FLUSH 100 UNIT/ML IV SOLN
500.0000 [IU] | Freq: Once | INTRAVENOUS | Status: AC | PRN
Start: 2023-04-18 — End: 2023-04-18
  Administered 2023-04-18: 500 [IU]

## 2023-04-18 MED ORDER — SODIUM CHLORIDE 0.9% FLUSH
10.0000 mL | Freq: Once | INTRAVENOUS | Status: AC
  Administered 2023-04-18: 10 mL

## 2023-04-18 NOTE — Progress Notes (Signed)
 [facility]   Telephone:(336) [phone] Fax:([phone]) [phone]   Clinic Follow up Note   Patient Care Team: Early, [name], NP as PCP - General (Nurse Practitioner) Lanny Callander, MD as Consulting Physician (Hematology and Oncology) Debby Hila, MD as Consulting Physician (General Surgery) Kriss Estefana DEL, DO as Consulting Physician (Gastroenterology)  Date of Service:  04/18/2023  CHIEF COMPLAINT: f/u of metastatic colon cancer  CURRENT THERAPY:  Pending surgery  Oncology History   Cancer of left colon (HCC) --cTxN0M1, stage IV with oligo liver metastasis, MMR normal, 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  The left descending colon cancer was only 4 mm, removed by polypectomy. The largest 3 cm, fungating mass in the rectosigmoid junction is 15 cm from anal verge on colonoscopy, biopsy confirmed moderate differentiated adenocarcinoma -I reviewed her staging CT chest from November 18, 2022, which showed no definitive evidence of metastasis, but showed borderline enlarged thoracic adenopathy.  I will obtain a PET scan for further evaluation. -Her liver MRI unfortunately showed a 1.2 cm mass in the posterior dome of liver, segment 7, biopsy on 12/03/22 confirmed metastasis from colon cancer, I reviewed with her.  -Given her metastatic disease, I recommend chemotherapy first.  She started FOLFOX on 12/18/2022 -She has been seen by colorectal surgeon Dr. Debby -plan to reevaluated after 3 months of chemo, and discuss surgical resection of the primary tumor and liver metastasis. -She is not a candidate for EGFR inhibitor, will hold beva for now due to pending surgeries.  -she is scheduled for hemicolectomy on 05/03/23 and liver ablation on 05/22/23 by IR      Assessment and Plan    Metastatic Colon Cancer 58 year Tammy Marks with metastatic colon cancer, scheduled for surgery on May 03, 2023, followed by liver ablation on May 22, 2023. Completed four  months of chemotherapy, last session on March 28, 2023. Plan includes a break from chemotherapy until post-surgery. Post-surgery, the need for additional chemotherapy will be assessed. Goal is to cure the cancer, though there is a high chance of recurrence. 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 Discussed risks, benefits, and alternatives, including switching to Xeloda  if neuropathy persists. Explained that the next two to three years are the highest risk for recurrence, with a good prognosis if cancer does not return within three years. Monitoring will include scans every three months and blood tests for circulating tumor DNA. 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 Discussed option of switching to Xeloda  to avoid further nerve damage. - Consider switching to Xeloda  if neuropathy persists  General Health Maintenance Advised to keep the port for future treatments. - Keep the port for future treatments - Ensure port flush every eight weeks  Plan -Patient will proceed with surgery and the liver ablation as scheduled - Follow up in six weeks, to finalize her 2 months adjuvant chemo.       SUMMARY OF ONCOLOGIC HISTORY: Oncology History  Cancer of left colon (HCC)  11/21/2022 Initial Diagnosis   Cancer of left colon (HCC)   12/18/2022 -  Chemotherapy   Patient is on Treatment Plan : COLORECTAL FOLFOX q14d     12/26/2022 PET scan   NM PET skull base to thigh  IMPRESSION: 1. Two hypermetabolic liver lesions consistent with metastatic disease. 2. Small hypermetabolic celiac axis and sigmoid mesocolon nodes  suggesting metastatic adenopathy. 3. No findings for metastatic disease involving the chest or bony structures. 4. Diffuse hypermetabolism throughout the colon possibly related to  recently eating or taking insulin  making it difficult to identified the patient's sigmoid cancer.      Discussed the use of AI scribe software for clinical note transcription with the patient, who gave verbal consent to proceed.  History of Present Illness   The patient, a 58 year Tammy Marks with metastatic colon cancer, presents for a follow-up visit. She has been off chemotherapy for a few weeks to prepare for surgery. Her last chemotherapy session was on December 19th. She has been on chemotherapy for four months and may require additional chemotherapy after surgery, depending on the outcome.  The patient reports feeling tired and has been experiencing difficulty sleeping. She also reports experiencing tingling, a side effect of the oxaliplatin  used in her chemotherapy.         All other systems were reviewed with the patient and are negative.  MEDICAL HISTORY:  Past Medical History:  Diagnosis Date   Diabetes mellitus without complication (HCC)     SURGICAL HISTORY: Past Surgical History:  Procedure Laterality Date   CESAREAN SECTION     CHOLECYSTECTOMY     IR IMAGING GUIDED PORT INSERTION  12/03/2022   IR RADIOLOGIST EVAL & MGMT  03/27/2023   IR US  LIVER BIOPSY  12/03/2022    I have reviewed the social history and family history with the patient and they are unchanged from previous note.  ALLERGIES:  is allergic to oxaliplatin .  MEDICATIONS:  Current Outpatient Medications  Medication Sig Dispense Refill   atorvastatin  (LIPITOR) 20 MG tablet Take 20 mg by mouth daily.     azelastine  (OPTIVAR ) 0.05 % ophthalmic solution Place 1 drop into both eyes 2 (two) times daily. 6 mL 12   dexamethasone  (DECADRON ) 4 MG tablet Take 2 tablets (8 mg total) by mouth daily. Start the day after chemotherapy for 2 days. Take with food. 30 tablet 1   Iron , Ferrous Sulfate , 325 (65 Fe) MG TABS Take 325 mg by mouth daily. 30 tablet 11   lidocaine -prilocaine  (EMLA ) cream Apply to affected area once  30 g 3   nystatin  (MYCOSTATIN /NYSTOP ) powder Apply 1 application to skin folds 3 (three) times daily as needed 60 g 2   ondansetron  (ZOFRAN ) 8 MG tablet Take 1 tablet (8 mg total) by mouth every 8 (eight) hours as needed for nausea or vomiting. Start on the third day after chemotherapy. 30 tablet 1   potassium chloride  SA (KLOR-CON  M) 20 MEQ tablet Take 1 tablet (20 mEq total) by mouth 2 (two) times daily. Take 1 tablet 3 times daily for 3 days, then twice daily 60 tablet 1   prochlorperazine  (COMPAZINE ) 10 MG tablet Take 1 tablet (10 mg total) by mouth every 6 (six) hours as needed for nausea or vomiting. 30 tablet 1   No current facility-administered medications for this visit.    PHYSICAL EXAMINATION: ECOG PERFORMANCE STATUS: 1 - Symptomatic but completely ambulatory  Vitals:   04/18/23 0914 04/18/23 0917  BP: (!) 159/88 (!) 154/83  Pulse: 95   Resp: 16   Temp: 98.8 F (37.1 C)   SpO2: 100%    Wt Readings from Last 3 Encounters:  04/18/23 277 lb 12.8 oz (126 kg)  03/28/23 271 lb 9.6 oz (123.2 kg)  03/11/23 268 lb 14.4 oz (122 kg)     GENERAL:alert, no distress and comfortable SKIN: skin color, texture, turgor are normal,  no rashes or significant lesions EYES: normal, Conjunctiva are pink and non-injected, sclera clear NECK: supple, thyroid normal size, non-tender, without nodularity LYMPH:  no palpable lymphadenopathy in the cervical, axillary  LUNGS: clear to auscultation and percussion with normal breathing effort HEART: regular rate & rhythm and no murmurs and no lower extremity edema ABDOMEN:abdomen soft, non-tender and normal bowel sounds Musculoskeletal:no cyanosis of digits and no clubbing  NEURO: alert & oriented x 3 with fluent speech, no focal motor/sensory deficits    LABORATORY DATA:  I have reviewed the data as listed    Latest Ref Rng & Units 04/18/2023    9:05 AM 03/28/2023   10:09 AM 03/11/2023   10:46 AM  CBC  WBC 4.0 - 10.5 K/uL 7.6  6.0  7.9    Hemoglobin 12.0 - 15.0 g/dL 9.3  9.3  9.9   Hematocrit 36.0 - 46.0 % 28.8  29.5  31.2   Platelets 150 - 400 K/uL 348  247  362         Latest Ref Rng & Units 04/18/2023    9:05 AM 03/28/2023   10:09 AM 03/11/2023   10:46 AM  CMP  Glucose 70 - 99 mg/dL 844  855  867   BUN 6 - 20 mg/dL 10  12  11    Creatinine 0.44 - 1.00 mg/dL 9.38  9.33  9.35   Sodium 135 - 145 mmol/L 138  139  139   Potassium 3.5 - 5.1 mmol/L 3.8  3.9  3.8   Chloride 98 - 111 mmol/L 102  103  102   CO2 Tammy - 32 mmol/L 28  26  27    Calcium  8.9 - 10.3 mg/dL 9.3  9.3  9.4   Total Protein 6.5 - 8.1 g/dL 7.2  7.1  7.2   Total Bilirubin 0.0 - 1.2 mg/dL 0.4  0.4  0.3   Alkaline Phos 38 - 126 U/L 73  73  70   AST 15 - 41 U/L 13  12  13    ALT 0 - 44 U/L 11  11  11        RADIOGRAPHIC STUDIES: I have personally reviewed the radiological images as listed and agreed with the findings in the report. No results found.    No orders of the defined types were placed in this encounter.  All questions were answered. The patient knows to call the clinic with any problems, questions or concerns. No barriers to learning was detected. The total time spent in the appointment was 25 minutes.     Onita Mattock, MD 04/18/2023

## 2023-04-19 ENCOUNTER — Other Ambulatory Visit: Payer: Self-pay

## 2023-04-20 ENCOUNTER — Inpatient Hospital Stay: Payer: 59

## 2023-04-22 ENCOUNTER — Encounter: Payer: Self-pay | Admitting: Hematology

## 2023-04-22 ENCOUNTER — Other Ambulatory Visit: Payer: Self-pay | Admitting: Nurse Practitioner

## 2023-04-22 MED ORDER — GABAPENTIN 100 MG PO CAPS: ORAL_CAPSULE | ORAL | 0 refills | Status: DC

## 2023-04-25 NOTE — Patient Instructions (Signed)
SURGICAL WAITING ROOM VISITATION  Patients having surgery or a procedure may have no more than 2 support people in the waiting area - these visitors may rotate.    Children under the age of 26 must have an adult with them who is not the patient.  Due to an increase in RSV and influenza rates and associated hospitalizations, children ages 29 and under may not visit patients in [facility] hospitals.  Visitors with respiratory illnesses are discouraged from visiting and should remain at home.  If the patient needs to stay at the hospital during part of their recovery, the visitor guidelines for inpatient rooms apply. Pre-op nurse will coordinate an appropriate time for 1 support person to accompany patient in pre-op.  This support person may not rotate.    Please refer to the [facility] website for the visitor guidelines for Inpatients (after your surgery is over and you are in a regular room).       Your procedure is scheduled on: 05/03/23   Report to [facility] Main Entrance    Report to admitting at  9:45 AM   Call this number if you have problems the morning of surgery [phone]   Do not eat food :After Midnight.   After Midnight you may have the following liquids until 9 AM DAY OF SURGERY  Water Non-Citrus Juices (without pulp, NO RED-Apple, White grape, White cranberry) Black Coffee (NO MILK/CREAM OR CREAMERS, sugar ok)  Clear Tea (NO MILK/CREAM OR CREAMERS, sugar ok) regular and decaf                             Plain Jell-O (NO RED)                                           Fruit ices (not with fruit pulp, NO RED)                                     Popsicles (NO RED)                                                               Sports drinks like Gatorade (NO RED)              Drink 2 G2 drinks AT 10:00 PM the night before surgery.        The day of surgery:  Drink ONE (1) Pre-Surgery G2 at 9 AM the morning of surgery. Drink in one sitting. Do not sip.   This drink was given to you during your hospital  pre-op appointment visit. Nothing else to drink after completing the  Pre-Surgery G2.          If you have questions, please contact your surgeon's office.   FOLLOW BOWEL PREP AND ANY ADDITIONAL PRE OP INSTRUCTIONS YOU RECEIVED FROM YOUR SURGEON'S OFFICE!!!     Oral Hygiene is also important to reduce your risk of infection.  Remember - BRUSH YOUR TEETH THE MORNING OF SURGERY WITH YOUR REGULAR TOOTHPASTE   Stop all vitamins and herbal supplements 7 days before surgery.   Take these medicines the morning of surgery with A SIP OF WATER: Atorvastatin  DO NOT TAKE ANY ORAL DIABETIC MEDICATIONS DAY OF YOUR SURGERY Hold Glipizide the morning of surgery Hold Metformin the morning of surgery             You may not have any metal on your body including hair pins, jewelry, and body piercing             Do not wear make-up, lotions, powders, perfumes/cologne, or deodorant  Do not wear nail polish including gel and S&S, artificial/acrylic nails, or any other type of covering on natural nails including finger and toenails. If you have artificial nails, gel coating, etc. that needs to be removed by a nail salon please have this removed prior to surgery or surgery may need to be canceled/ delayed if the surgeon/ anesthesia feels like they are unable to be safely monitored.   Do not shave  48 hours prior to surgery.    Do not bring valuables to the hospital. [facility] IS NOT             RESPONSIBLE   FOR VALUABLES.   Contacts, glasses, dentures or bridgework may not be worn into surgery.   Bring small overnight bag day of surgery.   DO NOT BRING YOUR HOME MEDICATIONS TO THE HOSPITAL. PHARMACY WILL DISPENSE MEDICATIONS LISTED ON YOUR MEDICATION LIST TO YOU DURING YOUR ADMISSION IN THE HOSPITAL!    Patients discharged on the day of surgery will not be allowed to drive home.  Someone NEEDS to stay with you  for the first 24 hours after anesthesia.   Special Instructions: Bring a copy of your healthcare power of attorney and living will documents the day of surgery if you haven't scanned them before.              Please read over the following fact sheets you were given: IF YOU HAVE QUESTIONS ABOUT YOUR PRE-OP INSTRUCTIONS PLEASE CALL [phone] Tammy Marks   If you received a COVID test during your pre-op visit  it is requested that you wear a mask when out in public, stay away from anyone that may not be feeling well and notify your surgeon if you develop symptoms. If you test positive for Covid or have been in contact with anyone that has tested positive in the last 10 days please notify you surgeon.     - Preparing for Surgery Before surgery, you can play an important role.  Because skin is not sterile, your skin needs to be as free of germs as possible.  You can reduce the number of germs on your skin by washing with CHG (chlorahexidine gluconate) soap before surgery.  CHG is an antiseptic cleaner which kills germs and bonds with the skin to continue killing germs even after washing. Please DO NOT use if you have an allergy to CHG or antibacterial soaps.  If your skin becomes reddened/irritated stop using the CHG and inform your nurse when you arrive at Short Stay. Do not shave (including legs and underarms) for at least 48 hours prior to the first CHG shower.  You may shave your face/neck.  Please follow these instructions carefully:  1.  Shower with CHG Soap the night before surgery and the  morning of surgery.  2.  If you choose  to wash your hair, wash your hair first as usual with your normal  shampoo.  3.  After you shampoo, rinse your hair and body thoroughly to remove the shampoo.                             4.  Use CHG as you would any other liquid soap.  You can apply chg directly to the skin and wash.  Gently with a scrungie or clean washcloth.  5.  Apply the CHG Soap to your  body ONLY FROM THE NECK DOWN.   Do   not use on face/ open                           Wound or open sores. Avoid contact with eyes, ears mouth and   genitals (private parts).                       Wash face,  Genitals (private parts) with your normal soap.             6.  Wash thoroughly, paying special attention to the area where your    surgery  will be performed.  7.  Thoroughly rinse your body with warm water from the neck down.  8.  DO NOT shower/wash with your normal soap after using and rinsing off the CHG Soap.                9.  Pat yourself dry with a clean towel.            10.  Wear clean pajamas.            11.  Place clean sheets on your bed the night of your first shower and do not  sleep with pets. Day of Surgery : Do not apply any lotions/deodorants the morning of surgery.  Please wear clean clothes to the hospital/surgery center.  FAILURE TO FOLLOW THESE INSTRUCTIONS MAY RESULT IN THE CANCELLATION OF YOUR SURGERY  PATIENT SIGNATURE_________________________________  NURSE SIGNATURE__________________________________   ________________________________________________________________________Incentive Spirometer  An incentive spirometer is a tool that can help keep your lungs clear and active. This tool measures how well you are filling your lungs with each breath. Taking long deep breaths may help reverse or decrease the chance of developing breathing (pulmonary) problems (especially infection) following: A long period of time when you are unable to move or be active. BEFORE THE PROCEDURE  If the spirometer includes an indicator to show your best effort, your nurse or respiratory therapist will set it to a desired goal. If possible, sit up straight or lean slightly forward. Try not to slouch. Hold the incentive spirometer in an upright position. INSTRUCTIONS FOR USE  Sit on the edge of your bed if possible, or sit up as far as you can in bed or on a chair. Hold the incentive  spirometer in an upright position. Breathe out normally. Place the mouthpiece in your mouth and seal your lips tightly around it. Breathe in slowly and as deeply as possible, raising the piston or the ball toward the top of the column. Hold your breath for 3-5 seconds or for as long as possible. Allow the piston or ball to fall to the bottom of the column. Remove the mouthpiece from your mouth and breathe out normally. Rest for a few seconds and repeat Steps 1 through 7 at  least 10 times every 1-2 hours when you are awake. Take your time and take a few normal breaths between deep breaths. The spirometer may include an indicator to show your best effort. Use the indicator as a goal to work toward during each repetition. After each set of 10 deep breaths, practice coughing to be sure your lungs are clear. If you have an incision (the cut made at the time of surgery), support your incision when coughing by placing a pillow or rolled up towels firmly against it. Once you are able to get out of bed, walk around indoors and cough well. You may stop using the incentive spirometer when instructed by your caregiver.  RISKS AND COMPLICATIONS Take your time so you do not get dizzy or light-headed. If you are in pain, you may need to take or ask for pain medication before doing incentive spirometry. It is harder to take a deep breath if you are having pain. AFTER USE Rest and breathe slowly and easily. It can be helpful to keep track of a log of your progress. Your caregiver can provide you with a simple table to help with this. If you are using the spirometer at home, follow these instructions: SEEK MEDICAL CARE IF:  You are having difficultly using the spirometer. You have trouble using the spirometer as often as instructed. Your pain medication is not giving enough relief while using the spirometer. You develop fever of 100.5 F (38.1 C) or higher. SEEK IMMEDIATE MEDICAL CARE IF:  You cough up bloody  sputum that had not been present before. You develop fever of 102 F (38.9 C) or greater. You develop worsening pain at or near the incision site. MAKE SURE YOU:  Understand these instructions. Will watch your condition. Will get help right away if you are not doing well or get worse. Document Released: 08/06/2006 Document Revised: 06/18/2011 Document Reviewed: 10/07/2006  WHAT IS A BLOOD TRANSFUSION? Blood Transfusion Information  A transfusion is the replacement of blood or some of its parts. Blood is made up of multiple cells which provide different functions. Red blood cells carry oxygen and are used for blood loss replacement. White blood cells fight against infection. Platelets control bleeding. Plasma helps clot blood. Other blood products are available for specialized needs, such as hemophilia or other clotting disorders. BEFORE THE TRANSFUSION  Who gives blood for transfusions?  Healthy volunteers who are fully evaluated to make sure their blood is safe. This is blood bank blood. Transfusion therapy is the safest it has ever been in the practice of medicine. Before blood is taken from a donor, a complete history is taken to make sure that person has no history of diseases nor engages in risky social behavior (examples are intravenous drug use or sexual activity with multiple partners). The donor's travel history is screened to minimize risk of transmitting infections, such as malaria. The donated blood is tested for signs of infectious diseases, such as HIV and hepatitis. The blood is then tested to be sure it is compatible with you in order to minimize the chance of a transfusion reaction. If you or a relative donates blood, this is often done in anticipation of surgery and is not appropriate for emergency situations. It takes many days to process the donated blood. RISKS AND COMPLICATIONS Although transfusion therapy is very safe and saves many lives, the main dangers of transfusion  include:  Getting an infectious disease. Developing a transfusion reaction. This is an allergic reaction to something in  the blood you were given. Every precaution is taken to prevent this. The decision to have a blood transfusion has been considered carefully by your caregiver before blood is given. Blood is not given unless the benefits outweigh the risks. AFTER THE TRANSFUSION Right after receiving a blood transfusion, you will usually feel much better and more energetic. This is especially true if your red blood cells have gotten low (anemic). The transfusion raises the level of the red blood cells which carry oxygen, and this usually causes an energy increase. The nurse administering the transfusion will monitor you carefully for complications. HOME CARE INSTRUCTIONS  No special instructions are needed after a transfusion. You may find your energy is better. Speak with your caregiver about any limitations on activity for underlying diseases you may have. SEEK MEDICAL CARE IF:  Your condition is not improving after your transfusion. You develop redness or irritation at the intravenous (IV) site. SEEK IMMEDIATE MEDICAL CARE IF:  Any of the following symptoms occur over the next 12 hours: Shaking chills. You have a temperature by mouth above 102 F (38.9 C), not controlled by medicine. Chest, back, or muscle pain. People around you feel you are not acting correctly or are confused. Shortness of breath or difficulty breathing. Dizziness and fainting. You get a rash or develop hives. You have a decrease in urine output. Your urine turns a dark color or changes to pink, red, or brown. Any of the following symptoms occur over the next 10 days: You have a temperature by mouth above 102 F (38.9 C), not controlled by medicine. Shortness of breath. Weakness after normal activity. The white part of the eye turns yellow (jaundice). You have a decrease in the amount of urine or are urinating  less often. Your urine turns a dark color or changes to pink, red, or brown. Document Released: 03/23/2000 Document Revised: 06/18/2011 Document Reviewed: 11/10/2007 [facility] Patient Information 2014 [place], [place].

## 2023-04-25 NOTE — Progress Notes (Addendum)
COVID Vaccine received:  []  No [x]  Yes Date of any COVID positive Test in last 90 days: no PCP - Enid Skeens NP Cardiologist - n/a  Chest x-ray -  EKG -  04/26/23 Epic Stress Test -  ECHO -  Cardiac Cath -   Bowel Prep - [x]  No  []   Yes ______  Pacemaker / ICD device [x]  No []  Yes   Spinal Cord Stimulator:[x]  No []  Yes       History of Sleep Apnea? [x]  No []  Yes   CPAP used?- [x]  No []  Yes    Does the patient monitor blood sugar?          [x]  No []  Yes  []  N/A  Patient has: []  NO Hx DM   []  Pre-DM                 []  DM1  [x]   DM2 Does patient have a [employer] or Dexacom? []  No []  Yes   Fasting Blood Sugar Ranges- ? Checks Blood Sugar ___0__ times a day  GLP1 agonist / usual dose - no GLP1 instructions:  SGLT-2 inhibitors / usual dose - no SGLT-2 instructions:   Blood Thinner / Instructions:no Aspirin Instructions:no  Comments:   Activity level: Patient is able  to climb a flight of stairs without difficulty; [x]  No CP  [x]  No SOB, _   Patient can perform ADLs without assistance.   Anesthesia review: HTN, DM,anemia, last chemo 03/2023  Patient denies shortness of breath, fever, cough and chest pain at PAT appointment.  Patient verbalized understanding and agreement to the Pre-Surgical Instructions that were given to them at this PAT appointment. Patient was also educated of the need to review these PAT instructions again prior to his/her surgery.I reviewed the appropriate phone numbers to call if they have any and questions or concerns.

## 2023-04-26 ENCOUNTER — Other Ambulatory Visit: Payer: Self-pay | Admitting: Nurse Practitioner

## 2023-04-26 ENCOUNTER — Other Ambulatory Visit: Payer: Self-pay

## 2023-04-26 ENCOUNTER — Encounter (HOSPITAL_COMMUNITY)
Admission: RE | Admit: 2023-04-26 | Discharge: 2023-04-26 | Disposition: A | Discharge status: Home / Self Care | Payer: 59 | Source: Ambulatory Visit | Attending: General Surgery | Admitting: General Surgery

## 2023-04-26 ENCOUNTER — Encounter (HOSPITAL_COMMUNITY): Payer: Self-pay

## 2023-04-26 VITALS — BP 156/88 | HR 90 | Temp 99.1°F | Resp 16 | Ht 65.0 in | Wt 270.0 lb

## 2023-04-26 DIAGNOSIS — I10 Essential (primary) hypertension: Secondary | ICD-10-CM | POA: Insufficient documentation

## 2023-04-26 DIAGNOSIS — R9431 Abnormal electrocardiogram [ECG] [EKG]: Secondary | ICD-10-CM | POA: Insufficient documentation

## 2023-04-26 DIAGNOSIS — Z01818 Encounter for other preprocedural examination: Secondary | ICD-10-CM | POA: Insufficient documentation

## 2023-04-26 DIAGNOSIS — Z1231 Encounter for screening mammogram for malignant neoplasm of breast: Secondary | ICD-10-CM

## 2023-04-26 DIAGNOSIS — Z01812 Encounter for preprocedural laboratory examination: Secondary | ICD-10-CM | POA: Diagnosis present

## 2023-04-26 DIAGNOSIS — E11628 Type 2 diabetes mellitus with other skin complications: Secondary | ICD-10-CM | POA: Insufficient documentation

## 2023-04-26 DIAGNOSIS — Z01810 Encounter for preprocedural cardiovascular examination: Secondary | ICD-10-CM | POA: Diagnosis present

## 2023-04-26 HISTORY — DX: Malignant (primary) neoplasm, unspecified: C80.1

## 2023-04-26 LAB — BASIC METABOLIC PANEL
Anion gap: 9 (ref 5–15)
BUN: 10 mg/dL (ref 6–20)
CO2: 26 mmol/L (ref 22–32)
Calcium: 8.8 mg/dL — ABNORMAL LOW (ref 8.9–10.3)
Chloride: 101 mmol/L (ref 98–111)
Creatinine, Ser: 0.44 mg/dL (ref 0.44–1.00)
GFR, Estimated: >60 mL/min (ref >60)
Glucose, Bld: 245 mg/dL — ABNORMAL HIGH (ref 70–99)
Potassium: 3.5 mmol/L (ref 3.5–5.1)
Sodium: 136 mmol/L (ref 135–145)

## 2023-04-26 LAB — HEMOGLOBIN A1C
Hgb A1c MFr Bld: 7.9 % — ABNORMAL HIGH (ref 4.8–5.6)
Mean Plasma Glucose: 180.03 mg/dL

## 2023-04-26 LAB — CBC
HCT: 31.2 % — ABNORMAL LOW (ref 36.0–46.0)
Hemoglobin: 9.6 g/dL — ABNORMAL LOW (ref 12.0–15.0)
MCH: 25.9 pg — ABNORMAL LOW (ref 26.0–34.0)
MCHC: 30.8 g/dL (ref 30.0–36.0)
MCV: 84.1 fL (ref 80.0–100.0)
Platelets: 373 10*3/uL (ref 150–400)
RBC: 3.71 MIL/uL — ABNORMAL LOW (ref 3.87–5.11)
RDW: 20.9 % — ABNORMAL HIGH (ref 11.5–15.5)
WBC: 8.7 10*3/uL (ref 4.0–10.5)
nRBC: 0 % (ref 0.0–0.2)

## 2023-04-26 LAB — GLUCOSE, CAPILLARY: Glucose-Capillary: 210 mg/dL — ABNORMAL HIGH (ref 70–99)

## 2023-04-26 NOTE — Progress Notes (Signed)
A work note was provided to the patient after missing 04/22/2023 through 04/24/2023 from symptoms related to ongoing treatment.

## 2023-04-30 NOTE — Progress Notes (Signed)
Please review pre op CBC from 04/26/23.

## 2023-05-02 ENCOUNTER — Inpatient Hospital Stay: Payer: 59

## 2023-05-02 ENCOUNTER — Inpatient Hospital Stay: Payer: 59 | Admitting: Hematology

## 2023-05-03 ENCOUNTER — Inpatient Hospital Stay (HOSPITAL_COMMUNITY)
Admission: RE | Admit: 2023-05-03 | Discharge: 2023-05-05 | DRG: 330 | Disposition: A | Discharge status: Home / Self Care | Payer: 59 | Attending: General Surgery | Admitting: General Surgery

## 2023-05-03 ENCOUNTER — Other Ambulatory Visit: Payer: Self-pay

## 2023-05-03 ENCOUNTER — Encounter (HOSPITAL_COMMUNITY)
Admission: RE | Disposition: A | Discharge status: Home / Self Care | Payer: Self-pay | Source: Home / Self Care | Attending: General Surgery

## 2023-05-03 ENCOUNTER — Encounter (HOSPITAL_COMMUNITY): Payer: Self-pay | Admitting: General Surgery

## 2023-05-03 ENCOUNTER — Inpatient Hospital Stay (HOSPITAL_COMMUNITY): Payer: 59 | Admitting: Anesthesiology

## 2023-05-03 DIAGNOSIS — Z6841 Body Mass Index (BMI) 40.0 and over, adult: Secondary | ICD-10-CM

## 2023-05-03 DIAGNOSIS — E66813 Obesity, class 3: Secondary | ICD-10-CM | POA: Diagnosis present

## 2023-05-03 DIAGNOSIS — E669 Obesity, unspecified: Secondary | ICD-10-CM | POA: Diagnosis present

## 2023-05-03 DIAGNOSIS — C189 Malignant neoplasm of colon, unspecified: Secondary | ICD-10-CM

## 2023-05-03 DIAGNOSIS — E785 Hyperlipidemia, unspecified: Secondary | ICD-10-CM

## 2023-05-03 DIAGNOSIS — K660 Peritoneal adhesions (postprocedural) (postinfection): Secondary | ICD-10-CM | POA: Diagnosis present

## 2023-05-03 DIAGNOSIS — C188 Malignant neoplasm of overlapping sites of colon: Secondary | ICD-10-CM | POA: Diagnosis present

## 2023-05-03 DIAGNOSIS — Z7984 Long term (current) use of oral hypoglycemic drugs: Secondary | ICD-10-CM | POA: Diagnosis not present

## 2023-05-03 DIAGNOSIS — E11628 Type 2 diabetes mellitus with other skin complications: Secondary | ICD-10-CM

## 2023-05-03 DIAGNOSIS — Z808 Family history of malignant neoplasm of other organs or systems: Secondary | ICD-10-CM

## 2023-05-03 DIAGNOSIS — Z9221 Personal history of antineoplastic chemotherapy: Secondary | ICD-10-CM

## 2023-05-03 DIAGNOSIS — E119 Type 2 diabetes mellitus without complications: Secondary | ICD-10-CM | POA: Diagnosis present

## 2023-05-03 DIAGNOSIS — Z8249 Family history of ischemic heart disease and other diseases of the circulatory system: Secondary | ICD-10-CM

## 2023-05-03 DIAGNOSIS — K625 Hemorrhage of anus and rectum: Secondary | ICD-10-CM | POA: Diagnosis present

## 2023-05-03 DIAGNOSIS — Z888 Allergy status to other drugs, medicaments and biological substances status: Secondary | ICD-10-CM | POA: Diagnosis not present

## 2023-05-03 DIAGNOSIS — Z833 Family history of diabetes mellitus: Secondary | ICD-10-CM | POA: Diagnosis not present

## 2023-05-03 DIAGNOSIS — C787 Secondary malignant neoplasm of liver and intrahepatic bile duct: Principal | ICD-10-CM | POA: Diagnosis present

## 2023-05-03 DIAGNOSIS — C19 Malignant neoplasm of rectosigmoid junction: Secondary | ICD-10-CM | POA: Diagnosis present

## 2023-05-03 DIAGNOSIS — C785 Secondary malignant neoplasm of large intestine and rectum: Principal | ICD-10-CM | POA: Diagnosis present

## 2023-05-03 LAB — TYPE AND SCREEN
ABO/RH(D): A POS
Antibody Screen: NEGATIVE

## 2023-05-03 LAB — GLUCOSE, CAPILLARY
Glucose-Capillary: 154 mg/dL — ABNORMAL HIGH (ref 70–99)
Glucose-Capillary: 270 mg/dL — ABNORMAL HIGH (ref 70–99)
Glucose-Capillary: 269 mg/dL — ABNORMAL HIGH (ref 70–99)

## 2023-05-03 SURGERY — COLECTOMY, SIGMOID, ROBOT-ASSISTED
Anesthesia: General

## 2023-05-03 MED ORDER — INSULIN ASPART 100 UNIT/ML IJ SOLN
0.0000 [IU] | Freq: Three times a day (TID) | INTRAMUSCULAR | Status: DC
  Administered 2023-05-04: 7 [IU] via SUBCUTANEOUS
  Administered 2023-05-04: 3 [IU] via SUBCUTANEOUS
  Administered 2023-05-04 – 2023-05-05 (×2): 4 [IU] via SUBCUTANEOUS

## 2023-05-03 MED ORDER — GABAPENTIN 300 MG PO CAPS
300.0000 mg | ORAL_CAPSULE | Freq: Two times a day (BID) | ORAL | Status: DC
Start: 2023-05-03 — End: 2023-05-05
  Administered 2023-05-03 – 2023-05-05 (×4): 300 mg via ORAL
  Filled 2023-05-03 (×4): qty 3

## 2023-05-03 MED ORDER — PROPOFOL 10 MG/ML IV BOLUS
INTRAVENOUS | Status: DC | PRN
  Administered 2023-05-03: 180 mg via INTRAVENOUS
  Administered 2023-05-03: 20 mg via INTRAVENOUS

## 2023-05-03 MED ORDER — GLIPIZIDE ER 10 MG PO TB24
10.0000 mg | ORAL_TABLET | Freq: Every day | ORAL | Status: DC
Start: 2023-05-05 — End: 2023-05-05
  Administered 2023-05-05: 10 mg via ORAL
  Filled 2023-05-03: qty 1

## 2023-05-03 MED ORDER — ACETAMINOPHEN 500 MG PO TABS
1000.0000 mg | ORAL_TABLET | ORAL | Status: AC
  Administered 2023-05-03: 1000 mg via ORAL
  Filled 2023-05-03: qty 2

## 2023-05-03 MED ORDER — DEXMEDETOMIDINE HCL IN NACL 80 MCG/20ML IV SOLN
INTRAVENOUS | Status: DC | PRN
  Administered 2023-05-03: 8 ug via INTRAVENOUS

## 2023-05-03 MED ORDER — ATORVASTATIN CALCIUM 20 MG PO TABS
20.0000 mg | ORAL_TABLET | Freq: Every day | ORAL | Status: DC
Start: 2023-05-04 — End: 2023-05-05
  Administered 2023-05-04 – 2023-05-05 (×2): 20 mg via ORAL
  Filled 2023-05-03 (×2): qty 1

## 2023-05-03 MED ORDER — LABETALOL HCL 5 MG/ML IV SOLN
INTRAVENOUS | Status: AC
  Filled 2023-05-03: qty 4

## 2023-05-03 MED ORDER — SACCHAROMYCES BOULARDII 250 MG PO CAPS
250.0000 mg | ORAL_CAPSULE | Freq: Two times a day (BID) | ORAL | Status: DC
Start: 2023-05-03 — End: 2023-05-05
  Administered 2023-05-03 – 2023-05-05 (×4): 250 mg via ORAL
  Filled 2023-05-03 (×4): qty 1

## 2023-05-03 MED ORDER — BUPIVACAINE-EPINEPHRINE 0.25% -1:200000 IJ SOLN
INTRAMUSCULAR | Status: DC | PRN
  Administered 2023-05-03: 30 mL

## 2023-05-03 MED ORDER — ALVIMOPAN 12 MG PO CAPS
12.0000 mg | ORAL_CAPSULE | Freq: Two times a day (BID) | ORAL | Status: DC
  Administered 2023-05-04 (×2): 12 mg via ORAL
  Filled 2023-05-03 (×2): qty 1

## 2023-05-03 MED ORDER — LABETALOL HCL 5 MG/ML IV SOLN
INTRAVENOUS | Status: DC | PRN
  Administered 2023-05-03: 5 mg via INTRAVENOUS
  Administered 2023-05-03 (×2): 2.5 mg via INTRAVENOUS

## 2023-05-03 MED ORDER — LACTATED RINGERS IV SOLN: INTRAVENOUS | Status: DC

## 2023-05-03 MED ORDER — ORAL CARE MOUTH RINSE
15.0000 mL | Freq: Once | OROMUCOSAL | Status: AC
Start: 2023-05-03 — End: 2023-05-03

## 2023-05-03 MED ORDER — SODIUM CHLORIDE 0.9 % IV SOLN
2.0000 g | Freq: Two times a day (BID) | INTRAVENOUS | Status: AC
  Administered 2023-05-03: 2 g via INTRAVENOUS
  Filled 2023-05-03: qty 2

## 2023-05-03 MED ORDER — ACETAMINOPHEN 500 MG PO TABS
1000.0000 mg | ORAL_TABLET | Freq: Four times a day (QID) | ORAL | Status: DC
  Administered 2023-05-03 – 2023-05-05 (×5): 1000 mg via ORAL
  Filled 2023-05-03 (×6): qty 2

## 2023-05-03 MED ORDER — FENTANYL CITRATE (PF) 100 MCG/2ML IJ SOLN
INTRAMUSCULAR | Status: AC
  Filled 2023-05-03: qty 2

## 2023-05-03 MED ORDER — LIDOCAINE HCL (PF) 2 % IJ SOLN
INTRAMUSCULAR | Status: DC | PRN
  Administered 2023-05-03: 1.5 mg/kg/h via INTRADERMAL

## 2023-05-03 MED ORDER — INSULIN ASPART 100 UNIT/ML IJ SOLN
INTRAMUSCULAR | Status: AC
  Filled 2023-05-03: qty 1

## 2023-05-03 MED ORDER — SUGAMMADEX SODIUM 200 MG/2ML IV SOLN
INTRAVENOUS | Status: DC | PRN
  Administered 2023-05-03: 300 mg via INTRAVENOUS

## 2023-05-03 MED ORDER — HYDROMORPHONE HCL 1 MG/ML IJ SOLN
0.5000 mg | INTRAMUSCULAR | Status: DC | PRN
  Administered 2023-05-03 – 2023-05-04 (×2): 0.5 mg via INTRAVENOUS
  Filled 2023-05-03 (×2): qty 0.5

## 2023-05-03 MED ORDER — ENSURE PRE-SURGERY PO LIQD
296.0000 mL | Freq: Once | ORAL | Status: DC
  Filled 2023-05-03: qty 296

## 2023-05-03 MED ORDER — LIDOCAINE HCL (CARDIAC) PF 100 MG/5ML IV SOSY
PREFILLED_SYRINGE | INTRAVENOUS | Status: DC | PRN
  Administered 2023-05-03: 50 mg via INTRAVENOUS

## 2023-05-03 MED ORDER — POLYETHYLENE GLYCOL 3350 17 GM/SCOOP PO POWD: 1.0000 | Freq: Once | ORAL | Status: DC

## 2023-05-03 MED ORDER — ROCURONIUM BROMIDE 100 MG/10ML IV SOLN
INTRAVENOUS | Status: DC | PRN
  Administered 2023-05-03: 20 mg via INTRAVENOUS
  Administered 2023-05-03: 60 mg via INTRAVENOUS
  Administered 2023-05-03: 10 mg via INTRAVENOUS
  Administered 2023-05-03: 30 mg via INTRAVENOUS

## 2023-05-03 MED ORDER — HYDROMORPHONE HCL 1 MG/ML IJ SOLN
INTRAMUSCULAR | Status: DC | PRN
  Administered 2023-05-03 (×2): .5 mg via INTRAVENOUS

## 2023-05-03 MED ORDER — ALBUMIN HUMAN 5 % IV SOLN
INTRAVENOUS | Status: AC
  Filled 2023-05-03: qty 250

## 2023-05-03 MED ORDER — HYDROMORPHONE HCL 1 MG/ML IJ SOLN
0.2500 mg | INTRAMUSCULAR | Status: DC | PRN
  Administered 2023-05-03: 0.5 mg via INTRAVENOUS

## 2023-05-03 MED ORDER — LACTATED RINGERS IV SOLN: INTRAVENOUS | Status: DC | PRN

## 2023-05-03 MED ORDER — BUPIVACAINE LIPOSOME 1.3 % IJ SUSP
INTRAMUSCULAR | Status: AC
  Filled 2023-05-03: qty 20

## 2023-05-03 MED ORDER — KCL IN DEXTROSE-NACL 20-5-0.45 MEQ/L-%-% IV SOLN
INTRAVENOUS | Status: DC
  Filled 2023-05-03 (×2): qty 1000

## 2023-05-03 MED ORDER — HYDROMORPHONE HCL 2 MG/ML IJ SOLN
INTRAMUSCULAR | Status: AC
  Filled 2023-05-03: qty 1

## 2023-05-03 MED ORDER — PHENYLEPHRINE HCL (PRESSORS) 10 MG/ML IV SOLN
INTRAVENOUS | Status: DC | PRN
  Administered 2023-05-03: 100 ug via INTRAVENOUS

## 2023-05-03 MED ORDER — METFORMIN HCL ER 500 MG PO TB24
1000.0000 mg | ORAL_TABLET | Freq: Two times a day (BID) | ORAL | Status: DC
  Administered 2023-05-05: 1000 mg via ORAL
  Filled 2023-05-03: qty 2

## 2023-05-03 MED ORDER — HYDROMORPHONE HCL 1 MG/ML IJ SOLN
INTRAMUSCULAR | Status: AC
  Filled 2023-05-03: qty 1

## 2023-05-03 MED ORDER — BUPIVACAINE LIPOSOME 1.3 % IJ SUSP: 20.0000 mL | Freq: Once | INTRAMUSCULAR | Status: DC

## 2023-05-03 MED ORDER — ALBUMIN HUMAN 5 % IV SOLN: INTRAVENOUS | Status: DC | PRN

## 2023-05-03 MED ORDER — ONDANSETRON HCL 4 MG PO TABS: 4.0000 mg | ORAL_TABLET | Freq: Four times a day (QID) | ORAL | Status: DC | PRN

## 2023-05-03 MED ORDER — MIDAZOLAM HCL 2 MG/2ML IJ SOLN
INTRAMUSCULAR | Status: AC
  Filled 2023-05-03: qty 2

## 2023-05-03 MED ORDER — 0.9 % SODIUM CHLORIDE (POUR BTL) OPTIME
TOPICAL | Status: DC | PRN
  Administered 2023-05-03: 1000 mL

## 2023-05-03 MED ORDER — INSULIN ASPART 100 UNIT/ML IJ SOLN
0.0000 [IU] | Freq: Every day | INTRAMUSCULAR | Status: DC
  Administered 2023-05-03: 3 [IU] via SUBCUTANEOUS
  Administered 2023-05-04: 2 [IU] via SUBCUTANEOUS

## 2023-05-03 MED ORDER — CHLORHEXIDINE GLUCONATE CLOTH 2 % EX PADS
6.0000 | MEDICATED_PAD | Freq: Every day | CUTANEOUS | Status: DC
  Administered 2023-05-03 – 2023-05-05 (×3): 6 via TOPICAL

## 2023-05-03 MED ORDER — CHLORHEXIDINE GLUCONATE 0.12 % MT SOLN
15.0000 mL | Freq: Once | OROMUCOSAL | Status: AC
  Administered 2023-05-03: 15 mL via OROMUCOSAL

## 2023-05-03 MED ORDER — ENSURE PRE-SURGERY PO LIQD
592.0000 mL | Freq: Once | ORAL | Status: DC
  Filled 2023-05-03: qty 592

## 2023-05-03 MED ORDER — ONDANSETRON HCL 4 MG/2ML IJ SOLN
INTRAMUSCULAR | Status: DC | PRN
  Administered 2023-05-03 (×2): 4 mg via INTRAVENOUS

## 2023-05-03 MED ORDER — ENSURE SURGERY PO LIQD
237.0000 mL | Freq: Two times a day (BID) | ORAL | Status: DC
Start: 2023-05-04 — End: 2023-05-05
  Administered 2023-05-04 – 2023-05-05 (×3): 237 mL via ORAL

## 2023-05-03 MED ORDER — INSULIN ASPART 100 UNIT/ML IJ SOLN
0.0000 [IU] | INTRAMUSCULAR | Status: AC | PRN
  Administered 2023-05-03: 2 [IU] via SUBCUTANEOUS
  Administered 2023-05-03: 8 [IU] via SUBCUTANEOUS
  Filled 2023-05-03: qty 1

## 2023-05-03 MED ORDER — GABAPENTIN 300 MG PO CAPS
300.0000 mg | ORAL_CAPSULE | ORAL | Status: AC
  Administered 2023-05-03: 300 mg via ORAL
  Filled 2023-05-03: qty 1

## 2023-05-03 MED ORDER — DIPHENHYDRAMINE HCL 50 MG/ML IJ SOLN: 12.5000 mg | Freq: Four times a day (QID) | INTRAMUSCULAR | Status: DC | PRN

## 2023-05-03 MED ORDER — ROCURONIUM BROMIDE 10 MG/ML (PF) SYRINGE
PREFILLED_SYRINGE | INTRAVENOUS | Status: AC
Start: 2023-05-03 — End: ?
  Filled 2023-05-03: qty 10

## 2023-05-03 MED ORDER — PROPOFOL 10 MG/ML IV BOLUS
INTRAVENOUS | Status: AC
Start: 2023-05-03 — End: ?
  Filled 2023-05-03: qty 20

## 2023-05-03 MED ORDER — ALUM & MAG HYDROXIDE-SIMETH 200-200-20 MG/5ML PO SUSP: 30.0000 mL | Freq: Four times a day (QID) | ORAL | Status: DC | PRN

## 2023-05-03 MED ORDER — BUPIVACAINE LIPOSOME 1.3 % IJ SUSP
INTRAMUSCULAR | Status: DC | PRN
  Administered 2023-05-03: 20 mL

## 2023-05-03 MED ORDER — RINGERS IRRIGATION IR SOLN
Status: DC | PRN
  Administered 2023-05-03: 1000 mL

## 2023-05-03 MED ORDER — DIPHENHYDRAMINE HCL 12.5 MG/5ML PO ELIX
12.5000 mg | ORAL_SOLUTION | Freq: Four times a day (QID) | ORAL | Status: DC | PRN
Start: 2023-05-03 — End: 2023-05-05

## 2023-05-03 MED ORDER — METOPROLOL TARTRATE 5 MG/5ML IV SOLN: 5.0000 mg | Freq: Four times a day (QID) | INTRAVENOUS | Status: DC

## 2023-05-03 MED ORDER — SIMETHICONE 80 MG PO CHEW
40.0000 mg | CHEWABLE_TABLET | Freq: Four times a day (QID) | ORAL | Status: DC | PRN
  Administered 2023-05-04: 40 mg via ORAL
  Filled 2023-05-03: qty 1

## 2023-05-03 MED ORDER — SODIUM CHLORIDE 0.9 % IV SOLN
2.0000 g | INTRAVENOUS | Status: AC
  Administered 2023-05-03: 2 g via INTRAVENOUS
  Filled 2023-05-03: qty 2

## 2023-05-03 MED ORDER — ONDANSETRON HCL 4 MG/2ML IJ SOLN
4.0000 mg | Freq: Four times a day (QID) | INTRAMUSCULAR | Status: DC | PRN
  Filled 2023-05-03: qty 2

## 2023-05-03 MED ORDER — HEPARIN SODIUM (PORCINE) 5000 UNIT/ML IJ SOLN
5000.0000 [IU] | Freq: Once | INTRAMUSCULAR | Status: AC
  Administered 2023-05-03: 5000 [IU] via SUBCUTANEOUS
  Filled 2023-05-03: qty 1

## 2023-05-03 MED ORDER — METOPROLOL TARTRATE 5 MG/5ML IV SOLN
5.0000 mg | INTRAVENOUS | Status: DC | PRN
  Administered 2023-05-03: 5 mg via INTRAVENOUS
  Filled 2023-05-03: qty 5

## 2023-05-03 MED ORDER — SODIUM CHLORIDE 0.9% FLUSH
10.0000 mL | INTRAVENOUS | Status: DC | PRN
  Administered 2023-05-05: 10 mL

## 2023-05-03 MED ORDER — INDOCYANINE GREEN 25 MG IV SOLR
INTRAVENOUS | Status: DC | PRN
  Administered 2023-05-03: 2.5 mg via INTRAVENOUS

## 2023-05-03 MED ORDER — POTASSIUM CHLORIDE CRYS ER 20 MEQ PO TBCR
20.0000 meq | EXTENDED_RELEASE_TABLET | Freq: Every day | ORAL | Status: DC
Start: 2023-05-04 — End: 2023-05-05
  Administered 2023-05-04 – 2023-05-05 (×2): 20 meq via ORAL
  Filled 2023-05-03 (×2): qty 1

## 2023-05-03 MED ORDER — BISACODYL 5 MG PO TBEC: 20.0000 mg | DELAYED_RELEASE_TABLET | Freq: Once | ORAL | Status: DC

## 2023-05-03 MED ORDER — DEXAMETHASONE SODIUM PHOSPHATE 10 MG/ML IJ SOLN
INTRAMUSCULAR | Status: DC | PRN
  Administered 2023-05-03: 4 mg via INTRAVENOUS

## 2023-05-03 MED ORDER — ALVIMOPAN 12 MG PO CAPS
12.0000 mg | ORAL_CAPSULE | ORAL | Status: AC
  Administered 2023-05-03: 12 mg via ORAL
  Filled 2023-05-03: qty 1

## 2023-05-03 MED ORDER — MIDAZOLAM HCL 5 MG/5ML IJ SOLN
INTRAMUSCULAR | Status: DC | PRN
  Administered 2023-05-03 (×2): 1 mg via INTRAVENOUS

## 2023-05-03 MED ORDER — BUPIVACAINE-EPINEPHRINE 0.25% -1:200000 IJ SOLN
INTRAMUSCULAR | Status: AC
  Filled 2023-05-03: qty 1

## 2023-05-03 MED ORDER — ENOXAPARIN SODIUM 40 MG/0.4ML IJ SOSY
40.0000 mg | PREFILLED_SYRINGE | INTRAMUSCULAR | Status: DC
Start: 2023-05-04 — End: 2023-05-05
  Administered 2023-05-04 – 2023-05-05 (×2): 40 mg via SUBCUTANEOUS
  Filled 2023-05-03 (×2): qty 0.4

## 2023-05-03 MED ORDER — FENTANYL CITRATE (PF) 100 MCG/2ML IJ SOLN
INTRAMUSCULAR | Status: DC | PRN
  Administered 2023-05-03 (×2): 50 ug via INTRAVENOUS
  Administered 2023-05-03 (×2): 25 ug via INTRAVENOUS
  Administered 2023-05-03: 50 ug via INTRAVENOUS

## 2023-05-03 MED ORDER — TRAMADOL HCL 50 MG PO TABS
50.0000 mg | ORAL_TABLET | Freq: Four times a day (QID) | ORAL | Status: DC | PRN
  Administered 2023-05-03 – 2023-05-04 (×2): 100 mg via ORAL
  Administered 2023-05-05: 50 mg via ORAL
  Administered 2023-05-05: 100 mg via ORAL
  Filled 2023-05-03 (×4): qty 2

## 2023-05-03 SURGICAL SUPPLY — 70 items
BAG COUNTER SPONGE SURGICOUNT (BAG) ×1 IMPLANT
BLADE EXTENDED COATED 6.5IN (ELECTRODE) IMPLANT
CANNULA REDUCER 12-8 DVNC XI (CANNULA) IMPLANT
COVER SURGICAL LIGHT HANDLE (MISCELLANEOUS) ×2 IMPLANT
COVER TIP SHEARS 8 DVNC (MISCELLANEOUS) ×1 IMPLANT
DRAIN CHANNEL 19F RND (DRAIN) IMPLANT
DRAPE ARM DVNC X/XI (DISPOSABLE) ×4 IMPLANT
DRAPE COLUMN DVNC XI (DISPOSABLE) ×1 IMPLANT
DRAPE SURG IRRIG POUCH 19X23 (DRAPES) ×1 IMPLANT
DRIVER NDL LRG 8 DVNC XI (INSTRUMENTS) ×1 IMPLANT
DRIVER NDLE LRG 8 DVNC XI (INSTRUMENTS) ×1
DRSG OPSITE POSTOP 4X6 (GAUZE/BANDAGES/DRESSINGS) IMPLANT
DRSG OPSITE POSTOP 4X8 (GAUZE/BANDAGES/DRESSINGS) IMPLANT
ELECT PENCIL ROCKER SW 15FT (MISCELLANEOUS) ×1 IMPLANT
ELECT REM PT RETURN 15FT ADLT (MISCELLANEOUS) ×1 IMPLANT
EVACUATOR SILICONE 100CC (DRAIN) IMPLANT
GLOVE BIO SURGEON STRL SZ 6.5 (GLOVE) ×3 IMPLANT
GLOVE INDICATOR 6.5 STRL GRN (GLOVE) ×3 IMPLANT
GOWN SRG XL LVL 4 BRTHBL STRL (GOWNS) ×1 IMPLANT
GOWN STRL REUS W/ TWL XL LVL3 (GOWN DISPOSABLE) ×3 IMPLANT
GRASPER SUT TROCAR 14GX15 (MISCELLANEOUS) IMPLANT
GRASPER TIP-UP FEN DVNC XI (INSTRUMENTS) ×1 IMPLANT
HOLDER FOLEY CATH W/STRAP (MISCELLANEOUS) ×1 IMPLANT
IRRIG SUCT STRYKERFLOW 2 WTIP (MISCELLANEOUS) ×1
IRRIGATION SUCT STRKRFLW 2 WTP (MISCELLANEOUS) ×1 IMPLANT
KIT PROCEDURE DVNC SI (MISCELLANEOUS) IMPLANT
KIT TURNOVER KIT A (KITS) IMPLANT
NDL INSUFFLATION 14GA 120MM (NEEDLE) ×1 IMPLANT
NEEDLE INSUFFLATION 14GA 120MM (NEEDLE) ×1
PACK CARDIOVASCULAR III (CUSTOM PROCEDURE TRAY) ×1 IMPLANT
PACK COLON (CUSTOM PROCEDURE TRAY) ×1 IMPLANT
PAD POSITIONING PINK XL (MISCELLANEOUS) ×1 IMPLANT
RELOAD STAPLE 60 3.5 BLU DVNC (STAPLE) IMPLANT
RELOAD STAPLE 60 4.3 GRN DVNC (STAPLE) IMPLANT
RETRACTOR WND ALEXIS 18 MED (MISCELLANEOUS) IMPLANT
RTRCTR WOUND ALEXIS 18CM MED (MISCELLANEOUS)
SCISSORS LAP 5X35 DISP (ENDOMECHANICALS) IMPLANT
SCISSORS MNPLR CVD DVNC XI (INSTRUMENTS) ×1 IMPLANT
SEAL UNIV 5-12 XI (MISCELLANEOUS) ×3 IMPLANT
SEALER VESSEL EXT DVNC XI (MISCELLANEOUS) ×1 IMPLANT
SOL ELECTROSURG ANTI STICK (MISCELLANEOUS) ×1
SOLUTION ELECTROSURG ANTI STCK (MISCELLANEOUS) ×1 IMPLANT
SPIKE FLUID TRANSFER (MISCELLANEOUS) IMPLANT
STAPLER 60 SUREFORM DVNC (STAPLE) IMPLANT
STAPLER ECHELON POWER CIR 29 (STAPLE) IMPLANT
STAPLER ECHELON POWER CIR 31 (STAPLE) IMPLANT
STAPLER RELOAD 3.5X60 BLU DVNC (STAPLE)
STAPLER RELOAD 4.3X60 GRN DVNC (STAPLE) ×2
SUT ETHILON 2 0 PS N (SUTURE) IMPLANT
SUT NOVA NAB GS-21 1 T12 (SUTURE) ×2 IMPLANT
SUT PROLENE 2 0 KS (SUTURE) IMPLANT
SUT SILK 2 0 SH CR/8 (SUTURE) IMPLANT
SUT SILK 2-0 18XBRD TIE 12 (SUTURE) ×1 IMPLANT
SUT SILK 3 0 SH CR/8 (SUTURE) ×1 IMPLANT
SUT SILK 3-0 18XBRD TIE 12 (SUTURE) IMPLANT
SUT V-LOC BARB 180 2/0GR6 GS22 (SUTURE)
SUT VIC AB 2-0 SH 18 (SUTURE) IMPLANT
SUT VIC AB 2-0 SH 27X BRD (SUTURE) IMPLANT
SUT VIC AB 3-0 SH 18 (SUTURE) IMPLANT
SUT VIC AB 4-0 PS2 27 (SUTURE) ×2 IMPLANT
SUT VICRYL 0 UR6 27IN ABS (SUTURE) ×1 IMPLANT
SUTURE V-LC BRB 180 2/0GR6GS22 (SUTURE) IMPLANT
SYR 20ML ECCENTRIC (SYRINGE) ×1 IMPLANT
SYS WOUND ALEXIS 18CM MED (MISCELLANEOUS)
SYSTEM WOUND ALEXIS 18CM MED (MISCELLANEOUS) IMPLANT
TOWEL OR 17X26 10 PK STRL BLUE (TOWEL DISPOSABLE) IMPLANT
TRAY FOLEY MTR SLVR 16FR STAT (SET/KITS/TRAYS/PACK) ×1 IMPLANT
TROCAR ADV FIXATION 5X100MM (TROCAR) ×1 IMPLANT
TUBING CONNECTING 10 (TUBING) ×2 IMPLANT
TUBING INSUFFLATION 10FT LAP (TUBING) ×1 IMPLANT

## 2023-05-03 NOTE — H&P (Signed)
PROVIDER:  Elenora Gamma, MD   MRN: [id number] DOB: 21-Mar-1966 DATE OF ENCOUNTER: 03/18/2023   Subjective      History of Present Illness: Tammy Marks is a 58 y.o. female who is seen today as an office consultation at the request of Dr. Lorenso Quarry.  58 year old female who underwent screening colonoscopy in late June 2024.  She had noticed a small amount of bright red blood with bowel movements.  During colonoscopy a rectosigmoid mass was noted and biopsies were obtained.  The area was tattooed.  A 4 mm polyp was also found in the descending colon.  This was removed completely.  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  An invasive adenocarcinoma was noted within the rectosigmoid colon as well.  CT scans of the abdomen and pelvis show some enlarged regional lymph nodes as well as a proximal rectal mass.  There was a lesion in the liver that was unable to be characterized on CT scan.  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  MRI abd showed a 1.2 cm segment 7 metastasis.    Review of Systems: A complete review of systems was obtained from the patient.  I have reviewed this information and discussed as appropriate with the patient.  See HPI as well for other ROS.     Medical History: Past Medical History      Past Medical History:  Diagnosis Date   Diabetes mellitus without complication (CMS/HHS-HCC)     History of cancer           Problem List  There is no problem list on file for this patient.      Past Surgical History       Past Surgical History:  Procedure Laterality Date   CESAREAN SECTION        x2   CHOLECYSTECTOMY       HERNIA REPAIR            Allergies  No Known Allergies     Medications Ordered Prior to Encounter        Current Outpatient Medications on File Prior to Visit  Medication Sig Dispense Refill    atorvastatin (LIPITOR) 20 MG tablet         glipiZIDE (GLUCOTROL XL) 10 MG XL tablet Take 10 mg by mouth once daily       metFORMIN (GLUCOPHAGE-XR) 500 MG XR tablet TAKE 2 TABLETS BY MOUTH WITH A MEAL TWICE DAILY        No current facility-administered medications on file prior to visit.        Family History       Family History  Problem Relation Age of Onset   Skin cancer Mother     High blood pressure (Hypertension) Mother     Obesity Father     High blood pressure (Hypertension) Father     Coronary Artery Disease (Blocked arteries around heart) Father     Diabetes Father          Tobacco Use History  Social History       Tobacco Use  Smoking Status Never  Smokeless Tobacco Never        Social History  Social History        Socioeconomic History   Marital status: Single  Tobacco Use   Smoking status: Never   Smokeless tobacco: Never  Vaping Use   Vaping status: Never Used  Substance and Sexual Activity   Alcohol use: Not Currently   Drug use: Never        Objective:      Vitals:   05/03/23 0949  BP: (!) 151/86  Pulse: 98  Resp: 16  Temp: 97.7 F (36.5 C)  SpO2: 99%        Exam Gen: NAD CV: RRR Lungs: CTA Abd: soft, vertical lower midline inc scar       Labs, Imaging and Diagnostic Testing: Most recent lab work reviewed.  Colonoscopy report and images reviewed.  Oncology note reviewed.  CT scan images and report reviewed.   Assessment and Plan:  Diagnoses and all orders for this visit:   Overlapping malignant neoplasm of colon (CMS/HHS-HCC)   58 year old obese female who presented to the office after colonoscopy due to rectal bleeding.  She was noted to have a rectosigmoid mass which was biopsied and tattooed.  Pathology shows adenocarcinoma.  She also had a descending colon polyp that was resected and removed that showed invasive adenocarcinoma within 1 mm of the margin.  This area was tattooed as well by Dr. Lorenso Quarry during a second  flexible sigmoidoscopy.  She completed her metastatic workup and was found to have a mass in her liver in segment 7.  Biopsy confirmed metastatic disease.  She has underwent 3 months of chemotherapy.  Repeat imaging shows decrease in segment 7 hepatic lesion and decreased size of sigmoid mesenteric lymph nodes.  She underwent IR microwave ablation in Dec 24.  Given her synchronous tumors, she will most likely need a left hemicolectomy.  We also discussed her diabetes management as her hemoglobin A1c was greater than 14.  Most recent was 7.9.  She seems to be doing much better with this.   The surgery and anatomy were described to the patient as well as the risks of surgery and the possible complications.  These include: Bleeding, deep abdominal infections and possible wound complications such as hernia and infection, damage to adjacent structures, leak of surgical connections, which can lead to other surgeries and possibly an ostomy, possible need for other procedures, such as abscess drains in radiology, possible prolonged hospital stay, possible diarrhea from removal of part of the colon, possible constipation from narcotics, possible bowel, bladder or sexual dysfunction if having rectal surgery, prolonged fatigue/weakness or appetite loss, possible early recurrence of of disease, possible complications of their medical problems such as heart disease or arrhythmias or lung problems, death (less than 1%). I believe the patient understands and wishes to proceed with the surgery.    Tammy Panda, MD Colon and Rectal Surgery [facility]

## 2023-05-03 NOTE — Anesthesia Procedure Notes (Signed)
Procedure Name: Intubation Date/Time: 05/03/2023 11:55 AM  Performed by: Garth Bigness, CRNAPre-anesthesia Checklist: Patient identified, Emergency Drugs available, Suction available and Patient being monitored Patient Re-evaluated:Patient Re-evaluated prior to induction Oxygen Delivery Method: Circle system utilized Preoxygenation: Pre-oxygenation with 100% oxygen Induction Type: IV induction Ventilation: Mask ventilation without difficulty Laryngoscope Size: Mac and 3 Grade View: Grade II Tube type: Oral Tube size: 7.0 mm Number of attempts: 1 Airway Equipment and Method: Stylet Placement Confirmation: ETT inserted through vocal cords under direct vision, positive ETCO2 and breath sounds checked- equal and bilateral Secured at: 23 cm Tube secured with: Tape Dental Injury: Teeth and Oropharynx as per pre-operative assessment

## 2023-05-03 NOTE — Transfer of Care (Signed)
Immediate Anesthesia Transfer of Care Note  Patient: Tammy Marks  Procedure(s) Performed: XI ROBOT ASSISTED LEFT HEMICOLECTOMY  Patient Location: PACU  Anesthesia Type:General  Level of Consciousness: awake, alert , oriented, and patient cooperative  Airway & Oxygen Therapy: Patient Spontanous Breathing and Patient connected to face mask oxygen  Post-op Assessment: Report given to RN and Post -op Vital signs reviewed and stable  Post vital signs: Reviewed and stable  Last Vitals:  Vitals Value Taken Time  BP 155/88 05/03/23 1553  Temp    Pulse 88 05/03/23 1556  Resp 22 05/03/23 1556  SpO2 97 % 05/03/23 1556  Vitals shown include unfiled device data.  Last Pain:  Vitals:   05/03/23 1004  TempSrc:   PainSc: 0-No pain         Complications: No notable events documented.

## 2023-05-03 NOTE — Op Note (Signed)
05/03/2023  3:37 PM  PATIENT:  Tammy Marks  58 y.o. female  Patient Care Team: Early, Sung Amabile, NP as PCP - General (Nurse Practitioner) Malachy Mood, MD as Consulting Physician (Hematology and Oncology) Romie Levee, MD as Consulting Physician (General Surgery) Lynann Bologna, DO as Consulting Physician (Gastroenterology)  PRE-OPERATIVE DIAGNOSIS:  Synchronous colon cancers  POST-OPERATIVE DIAGNOSIS:  Synchronous colon cancers  PROCEDURE:  XI ROBOT ASSISTED LEFT HEMICOLECTOMY    Surgeon(s): Romie Levee, MD Karie Soda, MD  ASSISTANT: Dr Michaell Cowing   ANESTHESIA:   local and general  EBL:173ml  Total I/O In: 2100 [I.V.:1500; IV Piggyback:600] Out: 350 [Urine:250; Blood:100]  Delay start of Pharmacological VTE agent (>24hrs) due to surgical blood loss or risk of bleeding:  no  DRAINS: none   SPECIMEN:  Source of Specimen:  Left colon  DISPOSITION OF SPECIMEN:  PATHOLOGY  COUNTS:  YES  PLAN OF CARE: Admit to inpatient   PATIENT DISPOSITION:  PACU - hemodynamically stable.  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  She has been treated with chemotherapy and microwave ablation of her liver lesion.  I recommended left hemicolectomy resection to remove the rest of her cancer:  The anatomy & physiology of the digestive tract was discussed.  The pathophysiology was discussed.  Natural history risks without surgery was discussed.   I worked to give an overview of the disease and the frequent need to have multispecialty involvement.  I feel the risks of no intervention will lead to serious problems that outweigh the operative risks; therefore, I recommended a partial colectomy to remove the pathology.  Laparoscopic & open techniques were discussed.   Risks such as bleeding, infection, abscess, leak, reoperation, possible ostomy, hernia, heart attack, death, and other risks were  discussed.  I noted a good likelihood this will help address the problem.   Goals of post-operative recovery were discussed as well.    The patient expressed understanding & wished to proceed with surgery.  OR FINDINGS:   Patient had Rectosigmoid mass and tattoo in the proximal descending colon  No obvious metastatic disease on visceral parietal peritoneum or liver.  The anastomosis rests ~9 cm from the anal verge by rigid proctoscopy.  DESCRIPTION:   Informed consent was confirmed.  The patient underwent general anaesthesia without difficulty.  The patient was positioned appropriately.  VTE prevention in place.  The patient's abdomen was clipped, prepped, & draped in a sterile fashion.  Surgical timeout confirmed our plan.  The patient was positioned in reverse Trendelenburg.  Abdominal entry was gained using a Varies needle in the LUQ.  Entry was clean.  I induced carbon dioxide insufflation.  An 8mm robotic port was placed in the RUQ.  Camera inspection revealed no injury.  Extra ports were carefully placed under direct laparoscopic visualization.  I laparoscopically reflected the greater omentum and the upper abdomen the small bowel in the upper abdomen. The patient was appropriately positioned and the robot was docked to the patient's left side.  Instruments were placed under direct visualization. Omental adhesions to her lower midline were taken down.   I mobilized the sigmoid colon off of the pelvic sidewall.  I scored the base of peritoneum of the right side of the mesentery of the left colon from the ligament of Treitz to the peritoneal reflection of the mid rectum.  The patient had tattoo noted in the distal sigmoid with  an obvious mass distal to it.  I elevated the sigmoid mesentery and enetered into the retro-mesenteric plane. We were able to identify the left ureter and gonadal vessels. We kept those posterior within the retroperitoneum and elevated the left colon mesentery off that. I  did isolated IMA pedicle but did not ligate it yet.  I continued distally and got into the avascular plane posterior to the mesorectum. This allowed me to help mobilize the rectum as well by freeing the mesorectum off the sacrum.  I mobilized the peritoneal coverings towards the peritoneal reflection on both the right and left sides of the rectum.  I could see the right and left ureters and stayed away from them.    I skeletonized the inferior mesenteric artery pedicle.  I went down to its takeoff from the aorta.  I isolated the inferior mesenteric vein off of the ligament of Treitz just cephalad to that as well.  After confirming the left ureter was out of the way, I went ahead and ligated the inferior mesenteric artery pedicle with bipolar robotic vessel sealer ~2cm above its takeoff from the aorta.  I did ligate the inferior mesenteric vein in a similar fashion.  We ensured hemostasis. I skeletonized the mesorectum ~ 2-3 cm distal to the mass in the proximal rectum using blunt dissection & bipolar robotic vessel sealer.  I mobilized the left colon in a lateral to medial fashion off the line of Toldt up towards the splenic flexure to ensure good mobilization of the left colon to reach into the pelvis.  We identified the tattoo in the posterior aspect of the proximal descending colon.  The patient was placed in reverse Trendelenburg.  I began to mobilize the splenic flexure.  The omental attachments were taken down from the transverse colon and placed out of the way.  I identified the splenocolic ligament and divided this to get into the lesser sac.  I continued across this to the level of the splenic flexure carefully dissecting this away from her spleen.  I transected the base of the mesentery to complete splenic flexure mobilization.  I connected this to my previous dissection using the robotic vessel sealer.  I continued my dissection proximally to the level of the middle colic artery.  I then bluntly  mobilized the colon mesentery off of the retroperitoneum to allow for mobilization into the pelvis.  The entire colon was mobilized, we confirmed that this would reach into the pelvis.  I then divided the remaining mesentery up to the level of the middle colic artery, which was left intact.  I confirmed good perfusion of the remaining colon using intravenous firefly injection.  Once this was complete, the mesentery was divided up to the level of the tattoo.  Once all the mesentery was divided, pulsatile bleeding was noted in the capillaries at the edge of our dissection margin confirming good blood supply.  At this point a 15 mm suprapubic port was placed and 2 loads of a 60 mm green load stapler were used to dissect the rectum.  We then undocked the robot and enlarged the suprapubic port into a Pfannenstiel incision.  An Alexis wound protector was placed.  The colon was then brought out of this and divided proximally over a pursestring device.  Good perfusion was noted with pulsatile bleeding at the edge of the cut line.  The pursestring suture was secured with 3-0 silk sutures.  A 29 mm EEA anvil was placed into the colon and the  pursestring was tied tightly around this.  Pericolonic fat was cleared from the base of the anvil and then this was placed back into the abdomen.  The abdomen was then reinsufflated and EEA stapler was inserted into the rectum and brought out just anterior to the staple line.  An anastomosis was created under laparoscopic visualization.  There was no tension noted on the anastomosis.  There was no leak when tested with insufflation under irrigation.  The anastomosis rests approximately 9 cm from the sphincter complex.  The abdomen was then irrigated with normal saline.  There was no sign of active bleeding.  We then switched to clean gowns gloves instruments and drapes.  The peritoneum of the Pfannenstiel incision was closed with a 0 Vicryl suture.  The fascia was closed using 2, #1  Novafil sutures in a running fashion.  The subcutaneous tissue was reapproximated using a running 2-0 Vicryl suture.  The skin was closed using a running 4-0 Vicryl subcuticular suture.  A sterile dressing was applied.  The remaining port sites were closed using interrupted 4-0 Vicryl suture and Dermabond.  The patient was then awakened from anesthesia and sent to the postanesthesia care unit in stable condition.  All counts were correct per operating room staff.  An MD assistant was necessary for tissue manipulation, retraction and positioning due to the complexity of the case and hospital policies   Vanita Panda, MD  Colorectal and General Surgery [facility]

## 2023-05-03 NOTE — Anesthesia Postprocedure Evaluation (Signed)
Anesthesia Post Note  Patient: Tammy Marks  Procedure(s) Performed: XI ROBOT ASSISTED LEFT HEMICOLECTOMY     Patient location during evaluation: PACU Anesthesia Type: General Level of consciousness: awake and alert Pain management: pain level controlled Vital Signs Assessment: post-procedure vital signs reviewed and stable Respiratory status: spontaneous breathing, nonlabored ventilation and respiratory function stable Cardiovascular status: blood pressure returned to baseline and stable Postop Assessment: no apparent nausea or vomiting Anesthetic complications: no  No notable events documented.  Last Vitals:  Vitals:   05/03/23 1715 05/03/23 1746  BP: (!) 182/96 (!) 184/95  Pulse: 83 82  Resp: (!) 26 17  Temp:    SpO2: 96% 100%    Last Pain:  Vitals:   05/03/23 1715  TempSrc:   PainSc: 6                  Kadesia Robel,W. EDMOND

## 2023-05-03 NOTE — Anesthesia Preprocedure Evaluation (Addendum)
Anesthesia Evaluation  Patient identified by MRN, date of birth, ID band Patient awake    Reviewed: Allergy & Precautions, H&P , NPO status , Patient's Chart, lab work & pertinent test results  Airway Mallampati: II  TM Distance: >3 FB Neck ROM: Full    Dental no notable dental hx. (+) Teeth Intact, Dental Advisory Given   Pulmonary neg pulmonary ROS   Pulmonary exam normal breath sounds clear to auscultation       Cardiovascular negative cardio ROS  Rhythm:Regular Rate:Normal     Neuro/Psych negative neurological ROS  negative psych ROS   GI/Hepatic negative GI ROS, Neg liver ROS,,,  Endo/Other  diabetes, Type 2, Oral Hypoglycemic Agents  Class 3 obesity  Renal/GU negative Renal ROS  negative genitourinary   Musculoskeletal   Abdominal   Peds  Hematology  (+) Blood dyscrasia, anemia   Anesthesia Other Findings   Reproductive/Obstetrics negative OB ROS                             Anesthesia Physical Anesthesia Plan  ASA: 3  Anesthesia Plan: General   Post-op Pain Management: Tylenol PO (pre-op)*   Induction: Intravenous  PONV Risk Score and Plan: 4 or greater and Ondansetron, Dexamethasone and Midazolam  Airway Management Planned: Oral ETT  Additional Equipment:   Intra-op Plan:   Post-operative Plan: Extubation in OR  Informed Consent: I have reviewed the patients History and Physical, chart, labs and discussed the procedure including the risks, benefits and alternatives for the proposed anesthesia with the patient or authorized representative who has indicated his/her understanding and acceptance.     Dental advisory given  Plan Discussed with: CRNA  Anesthesia Plan Comments:        Anesthesia Quick Evaluation

## 2023-05-04 ENCOUNTER — Inpatient Hospital Stay: Payer: 59

## 2023-05-04 LAB — BASIC METABOLIC PANEL
Anion gap: 12 (ref 5–15)
BUN: 7 mg/dL (ref 6–20)
CO2: 24 mmol/L (ref 22–32)
Calcium: 8 mg/dL — ABNORMAL LOW (ref 8.9–10.3)
Chloride: 99 mmol/L (ref 98–111)
Creatinine, Ser: 0.7 mg/dL (ref 0.44–1.00)
GFR, Estimated: >60 mL/min (ref >60)
Glucose, Bld: 256 mg/dL — ABNORMAL HIGH (ref 70–99)
Potassium: 4.4 mmol/L (ref 3.5–5.1)
Sodium: 135 mmol/L (ref 135–145)

## 2023-05-04 LAB — GLUCOSE, CAPILLARY
Glucose-Capillary: 224 mg/dL — ABNORMAL HIGH (ref 70–99)
Glucose-Capillary: 141 mg/dL — ABNORMAL HIGH (ref 70–99)
Glucose-Capillary: 192 mg/dL — ABNORMAL HIGH (ref 70–99)
Glucose-Capillary: 213 mg/dL — ABNORMAL HIGH (ref 70–99)

## 2023-05-04 LAB — CBC
HCT: 25.8 % — ABNORMAL LOW (ref 36.0–46.0)
Hemoglobin: 8.1 g/dL — ABNORMAL LOW (ref 12.0–15.0)
MCH: 26 pg (ref 26.0–34.0)
MCHC: 31.4 g/dL (ref 30.0–36.0)
MCV: 82.7 fL (ref 80.0–100.0)
Platelets: 348 10*3/uL (ref 150–400)
RBC: 3.12 MIL/uL — ABNORMAL LOW (ref 3.87–5.11)
RDW: 19.5 % — ABNORMAL HIGH (ref 11.5–15.5)
WBC: 10.2 10*3/uL (ref 4.0–10.5)
nRBC: 0 % (ref 0.0–0.2)

## 2023-05-04 NOTE — Progress Notes (Signed)
Colon cancer metastasized to liver [facility])  Subjective: Pain ok, tolerating fulls, passing flatus, no nausea  Objective: Vital signs in last 24 hours: Temp:  [97.6 F (36.4 C)-98.6 F (37 C)] 97.9 F (36.6 C) (01/25 0538) Pulse Rate:  [77-98] 81 (01/25 0538) Resp:  [16-28] 18 (01/25 0538) BP: (130-184)/(76-99) 130/76 (01/25 0538) SpO2:  [91 %-100 %] 96 % (01/25 0538) Weight:  [122.5 kg-132.2 kg] 132.2 kg (01/25 0500) Last BM Date : 05/03/23  Intake/Output from previous day: 01/24 0701 - 01/25 0700 In: 3461.3 [P.O.:420; I.V.:2341.3; IV Piggyback:700] Out: 2125 [Urine:2025; Blood:100] Intake/Output this shift: Total I/O In: -  Out: 300 [Urine:300]  General appearance: alert and cooperative GI: soft, non-distended  Lab Results:  Results for orders placed or performed during the hospital encounter of 05/03/23 (from the past 24 hours)  Glucose, capillary     Status: Abnormal   Collection Time: 05/03/23  9:52 AM  Result Value Ref Range   Glucose-Capillary 154 (H) 70 - 99 mg/dL   Comment 1 Notify RN    Comment 2 Document in Chart   Glucose, capillary     Status: Abnormal   Collection Time: 05/03/23  3:55 PM  Result Value Ref Range   Glucose-Capillary 270 (H) 70 - 99 mg/dL   Comment 1 Document in Chart   Glucose, capillary     Status: Abnormal   Collection Time: 05/03/23  8:37 PM  Result Value Ref Range   Glucose-Capillary 269 (H) 70 - 99 mg/dL  CBC     Status: Abnormal   Collection Time: 05/04/23  5:00 AM  Result Value Ref Range   WBC 10.2 4.0 - 10.5 K/uL   RBC 3.12 (L) 3.87 - 5.11 MIL/uL   Hemoglobin 8.1 (L) 12.0 - 15.0 g/dL   HCT 16.1 (L) 09.6 - 04.5 %   MCV 82.7 80.0 - 100.0 fL   MCH 26.0 26.0 - 34.0 pg   MCHC 31.4 30.0 - 36.0 g/dL   RDW 40.9 (H) 81.1 - 91.4 %   Platelets 348 150 - 400 K/uL   nRBC 0.0 0.0 - 0.2 %  Basic metabolic panel     Status: Abnormal   Collection Time: 05/04/23  5:00 AM  Result Value Ref Range   Sodium 135 135 - 145 mmol/L   Potassium  4.4 3.5 - 5.1 mmol/L   Chloride 99 98 - 111 mmol/L   CO2 24 22 - 32 mmol/L   Glucose, Bld 256 (H) 70 - 99 mg/dL   BUN 7 6 - 20 mg/dL   Creatinine, Ser 7.82 0.44 - 1.00 mg/dL   Calcium 8.0 (L) 8.9 - 10.3 mg/dL   GFR, Estimated >95 >62 mL/min   Anion gap 12 5 - 15  Glucose, capillary     Status: Abnormal   Collection Time: 05/04/23  7:26 AM  Result Value Ref Range   Glucose-Capillary 224 (H) 70 - 99 mg/dL     Studies/Results Radiology     MEDS, Scheduled  acetaminophen  1,000 mg Oral Q6H   alvimopan  12 mg Oral BID   atorvastatin  20 mg Oral Daily   Chlorhexidine Gluconate Cloth  6 each Topical Daily   enoxaparin (LOVENOX) injection  40 mg Subcutaneous Q24H   feeding supplement  237 mL Oral BID BM   gabapentin  300 mg Oral BID   [START ON 05/05/2023] glipiZIDE  10 mg Oral QAC breakfast   insulin aspart  0-20 Units Subcutaneous TID WC   insulin aspart  0-5 Units Subcutaneous QHS   [START ON 05/05/2023] metFORMIN  1,000 mg Oral BID WC   potassium chloride SA  20 mEq Oral Daily   saccharomyces boulardii  250 mg Oral BID     Assessment: Colon cancer metastasized to liver [facility]) Expected post op course  Plan: D/c foley Advance diet to regular Ambulate in hall SL IVF's   LOS: 1 day    Vanita Panda, MD [facility], [place]     05/04/2023 8:30 AM

## 2023-05-05 LAB — BASIC METABOLIC PANEL
Anion gap: 9 (ref 5–15)
BUN: 8 mg/dL (ref 6–20)
CO2: 26 mmol/L (ref 22–32)
Calcium: 8.4 mg/dL — ABNORMAL LOW (ref 8.9–10.3)
Chloride: 102 mmol/L (ref 98–111)
Creatinine, Ser: 0.67 mg/dL (ref 0.44–1.00)
GFR, Estimated: >60 mL/min (ref >60)
Glucose, Bld: 210 mg/dL — ABNORMAL HIGH (ref 70–99)
Potassium: 3.8 mmol/L (ref 3.5–5.1)
Sodium: 137 mmol/L (ref 135–145)

## 2023-05-05 LAB — CBC
HCT: 25.7 % — ABNORMAL LOW (ref 36.0–46.0)
Hemoglobin: 8 g/dL — ABNORMAL LOW (ref 12.0–15.0)
MCH: 26.1 pg (ref 26.0–34.0)
MCHC: 31.1 g/dL (ref 30.0–36.0)
MCV: 83.7 fL (ref 80.0–100.0)
Platelets: 333 10*3/uL (ref 150–400)
RBC: 3.07 MIL/uL — ABNORMAL LOW (ref 3.87–5.11)
RDW: 19.7 % — ABNORMAL HIGH (ref 11.5–15.5)
WBC: 8.4 10*3/uL (ref 4.0–10.5)
nRBC: 0 % (ref 0.0–0.2)

## 2023-05-05 LAB — GLUCOSE, CAPILLARY: Glucose-Capillary: 153 mg/dL — ABNORMAL HIGH (ref 70–99)

## 2023-05-05 MED ORDER — TRAMADOL HCL 50 MG PO TABS: 50.0000 mg | ORAL_TABLET | Freq: Four times a day (QID) | ORAL | 0 refills | Status: DC | PRN

## 2023-05-05 MED ORDER — HEPARIN SOD (PORK) LOCK FLUSH 100 UNIT/ML IV SOLN
500.0000 [IU] | INTRAVENOUS | Status: AC | PRN
  Administered 2023-05-05: 500 [IU]

## 2023-05-05 NOTE — Progress Notes (Signed)
AVS reviewed w/ pt who verbalized an understanding- no other questions at this time. Pt dressing for d/c to home. Pt home w/ friend via lyft. Will call  from  lobby

## 2023-05-05 NOTE — Discharge Instructions (Signed)

## 2023-05-05 NOTE — Plan of Care (Signed)
  Problem: Education: Goal: Understanding of discharge needs will improve Outcome: Progressing Goal: Verbalization of understanding of the causes of altered bowel function will improve Outcome: Progressing   Problem: Activity: Goal: Ability to tolerate increased activity will improve Outcome: Progressing   Problem: Bowel/Gastric: Goal: Gastrointestinal status for postoperative course will improve Outcome: Progressing   Problem: Elimination: Goal: Will not experience complications related to bowel motility Outcome: Progressing Goal: Will not experience complications related to urinary retention Outcome: Progressing   Problem: Pain Managment: Goal: General experience of comfort will improve and/or be controlled Outcome: Progressing

## 2023-05-05 NOTE — Discharge Summary (Signed)
Physician Discharge Summary  Patient ID: Tammy Marks MRN: [id number] DOB/AGE: [birth year] 58 y.o.  Admit date: 05/03/2023 Discharge date: 05/05/2023  Admission Diagnoses: Colon cancer metastasized to the liver Discharge Diagnoses:  Principal Problem:   Colon cancer metastasized to liver [facility])   Discharged Condition: good  Hospital Course: Patient was admitted to the med surg floor after surgery.  Diet was advanced as tolerated.  Patient began to have bowel function on postop day 1.  By postop day 2, she was tolerating a solid diet and pain was controlled with oral medications.  She was urinating without difficulty and ambulating without assistance.  Patient was felt to be in stable condition for discharge to home.   Consults: None  Significant Diagnostic Studies: labs: cbc, bmet  Treatments: IV hydration, analgesia: acetaminophen and Tramadol, insulin: regular, and surgery: robotic L hemicolectomy  Discharge Exam: Blood pressure (!) 153/78, pulse 93, temperature 98.4 F (36.9 C), temperature source Oral, resp. rate 18, height 5\' 5"  (1.651 m), weight 132.2 kg, last menstrual period 06/20/2020, SpO2 93%. General appearance: alert and cooperative GI: soft, nondistended Incision/Wound: clean, dry, intact  Disposition: Discharge disposition: 01-Home or Self Care        Allergies as of 05/05/2023       Reactions   Oxaliplatin Itching, Other (See Comments), Cough   Itchy lips, coughing, need to burp feeling - please see note from 03/28/2023        Medication List     STOP taking these medications    dexamethasone 4 MG tablet Commonly known as: DECADRON   EQ Gentle Laxative 5 MG EC tablet Generic drug: bisacodyl   metroNIDAZOLE 500 MG tablet Commonly known as: FLAGYL   neomycin 500 MG tablet Commonly known as: MYCIFRADIN       TAKE these medications    atorvastatin 20 MG tablet Commonly known as: LIPITOR Take 20 mg by mouth daily.   gabapentin 100  MG capsule Commonly known as: Neurontin Take 1 capsule po at bedtime. May increase to 2 capsules daily as needed and as tolerated.   glipiZIDE 10 MG 24 hr tablet Commonly known as: GLUCOTROL XL Take 10 mg by mouth daily.   Iron (Ferrous Sulfate) 325 (65 Fe) MG Tabs Take 325 mg by mouth daily.   lidocaine-prilocaine cream Commonly known as: EMLA Apply to affected area once   metFORMIN 500 MG 24 hr tablet Commonly known as: GLUCOPHAGE-XR Take 1,000 mg by mouth 2 (two) times daily.   ondansetron 8 MG tablet Commonly known as: Zofran Take 1 tablet (8 mg total) by mouth every 8 (eight) hours as needed for nausea or vomiting. Start on the third day after chemotherapy.   polyethylene glycol powder 17 GM/SCOOP powder Commonly known as: GLYCOLAX/MIRALAX Take 17 g by mouth daily as needed for mild constipation or moderate constipation.   potassium chloride SA 20 MEQ tablet Commonly known as: KLOR-CON M Take 1 tablet (20 mEq total) by mouth 2 (two) times daily. Take 1 tablet 3 times daily for 3 days, then twice daily What changed:  when to take this additional instructions   prochlorperazine 10 MG tablet Commonly known as: COMPAZINE Take 1 tablet (10 mg total) by mouth every 6 (six) hours as needed for nausea or vomiting.   traMADol 50 MG tablet Commonly known as: ULTRAM Take 1-2 tablets (50-100 mg total) by mouth every 6 (six) hours as needed for moderate pain (pain score 4-6).   Vitamin D3 50 MCG (2000 UT) Tabs Take 1,000  Units by mouth daily.        Follow-up Information     Romie Levee, MD. Schedule an appointment as soon as possible for a visit in 2 week(s).   Specialties: General Surgery, Colon and Rectal Surgery Why: as scheduled Contact information: [address] [place] [address] [phone]                 Signed: Vanita Panda 05/05/2023, 7:59 AM

## 2023-05-06 LAB — SURGICAL PATHOLOGY

## 2023-05-10 ENCOUNTER — Ambulatory Visit
Admission: RE | Admit: 2023-05-10 | Discharge: 2023-05-10 | Disposition: A | Discharge status: Home / Self Care | Payer: 59 | Source: Ambulatory Visit | Attending: Nurse Practitioner | Admitting: Nurse Practitioner

## 2023-05-10 DIAGNOSIS — Z1231 Encounter for screening mammogram for malignant neoplasm of breast: Secondary | ICD-10-CM

## 2023-05-13 NOTE — Patient Instructions (Signed)
SURGICAL WAITING ROOM VISITATION  Patients having surgery or a procedure may have no more than 2 support people in the waiting area - these visitors may rotate.    Children under the age of 44 must have an adult with them who is not the patient.  Due to an increase in RSV and influenza rates and associated hospitalizations, children ages 24 and under may not visit patients in [facility] hospitals.  Visitors with respiratory illnesses are discouraged from visiting and should remain at home.  If the patient needs to stay at the hospital during part of their recovery, the visitor guidelines for inpatient rooms apply. Pre-op nurse will coordinate an appropriate time for 1 support person to accompany patient in pre-op.  This support person may not rotate.    Please refer to the [facility] website for the visitor guidelines for Inpatients (after your surgery is over and you are in a regular room).    Your procedure is scheduled on: 05/22/23   Report to [facility] Main Entrance    Report to admitting at  6:15 AM   Call this number if you have problems the morning of surgery [phone]   Do not eat food or drink liquids :After Midnight          If you have questions, please contact your surgeon's office.     Oral Hygiene is also important to reduce your risk of infection.                                    Remember - BRUSH YOUR TEETH THE MORNING OF SURGERY WITH YOUR REGULAR TOOTHPASTE   Stop all vitamins and herbal supplements 7 days before surgery.   Take these medicines the morning of surgery with A SIP OF WATER: Atorvastin, Gabapentin  DO NOT TAKE ANY ORAL DIABETIC MEDICATIONS DAY OF YOUR SURGERY  How to Manage Your Diabetes Before and After Surgery  Why is it important to control my blood sugar before and after surgery? Improving blood sugar levels before and after surgery helps healing and can limit problems. A way of improving blood sugar control is eating a  healthy diet by:  Eating less sugar and carbohydrates  Increasing activity/exercise  Talking with your doctor about reaching your blood sugar goals High blood sugars (greater than 180 mg/dL) can raise your risk of infections and slow your recovery, so you will need to focus on controlling your diabetes during the weeks before surgery. Make sure that the doctor who takes care of your diabetes knows about your planned surgery including the date and location.  How do I manage my blood sugar before surgery? Check your blood sugar at least 4 times a day, starting 2 days before surgery, to make sure that the level is not too high or low. Check your blood sugar the morning of your surgery when you wake up and every 2 hours until you get to the Short Stay unit. If your blood sugar is less than 70 mg/dL, you will need to treat for low blood sugar: Do not take insulin. Treat a low blood sugar (less than 70 mg/dL) with  cup of clear juice (cranberry or apple), 4 glucose tablets, OR glucose gel. Recheck blood sugar in 15 minutes after treatment (to make sure it is greater than 70 mg/dL). If your blood sugar is not greater than 70 mg/dL on recheck, call [phone] for further  instructions. Report your blood sugar to the short stay nurse when you get to Short Stay.  If you are admitted to the hospital after surgery: Your blood sugar will be checked by the staff and you will probably be given insulin after surgery (instead of oral diabetes medicines) to make sure you have good blood sugar levels. The goal for blood sugar control after surgery is 80-180 mg/dL.   WHAT DO I DO ABOUT MY DIABETES MEDICATION?  Do not take oral diabetes medicines (pills) the morning of surgery.  THE DAY BEFORE SURGERY, take morning dose of Glipizide, no evening or afternoon dose. Take Metformin as prescribed.    THE MORNING OF SURGERY, do not take Glipizide or Metformin.  Reviewed and Endorsed by [facility] Patient  Education Committee, August 2015             You may not have any metal on your body including hair pins, jewelry, and body piercing             Do not wear make-up, lotions, powders, perfumes, or deodorant  Do not wear nail polish including gel and S&S, artificial/acrylic nails, or any other type of covering on natural nails including finger and toenails. If you have artificial nails, gel coating, etc. that needs to be removed by a nail salon please have this removed prior to surgery or surgery may need to be canceled/ delayed if the surgeon/ anesthesia feels like they are unable to be safely monitored.   Do not shave  48 hours prior to surgery.    Do not bring valuables to the hospital. [place] IS NOT             RESPONSIBLE   FOR VALUABLES.   Contacts, glasses, dentures or bridgework may not be worn into surgery.   Bring small overnight bag day of surgery.   DO NOT BRING YOUR HOME MEDICATIONS TO THE HOSPITAL. PHARMACY WILL DISPENSE MEDICATIONS LISTED ON YOUR MEDICATION LIST TO YOU DURING YOUR ADMISSION IN THE HOSPITAL!    Patients discharged on the day of surgery will not be allowed to drive home.  Someone NEEDS to stay with you for the first 24 hours after anesthesia.              Please read over the following fact sheets you were given: IF YOU HAVE QUESTIONS ABOUT YOUR PRE-OP INSTRUCTIONS PLEASE CALL [name]   If you received a COVID test during your pre-op visit  it is requested that you wear a mask when out in public, stay away from anyone that may not be feeling well and notify your surgeon if you develop symptoms. If you test positive for Covid or have been in contact with anyone that has tested positive in the last 10 days please notify you surgeon.    [place] - Preparing for Surgery Before surgery, you can play an important role.  Because skin is not sterile, your skin needs to be as free of germs as possible.  You can reduce the number of germs on your  skin by washing with CHG (chlorahexidine gluconate) soap before surgery.  CHG is an antiseptic cleaner which kills germs and bonds with the skin to continue killing germs even after washing. Please DO NOT use if you have an allergy to CHG or antibacterial soaps.  If your skin becomes reddened/irritated stop using the CHG and inform your nurse when you arrive at Short Stay. Do not shave (including legs and underarms) for  at least 48 hours prior to the first CHG shower.  You may shave your face/neck.  Please follow these instructions carefully:  1.  Shower with CHG Soap the night before surgery and the  morning of surgery.  2.  If you choose to wash your hair, wash your hair first as usual with your normal  shampoo.  3.  After you shampoo, rinse your hair and body thoroughly to remove the shampoo.                             4.  Use CHG as you would any other liquid soap.  You can apply chg directly to the skin and wash.  Gently with a scrungie or clean washcloth.  5.  Apply the CHG Soap to your body ONLY FROM THE NECK DOWN.   Do   not use on face/ open                           Wound or open sores. Avoid contact with eyes, ears mouth and   genitals (private parts).                       Wash face,  Genitals (private parts) with your normal soap.             6.  Wash thoroughly, paying special attention to the area where your    surgery  will be performed.  7.  Thoroughly rinse your body with warm water from the neck down.  8.  DO NOT shower/wash with your normal soap after using and rinsing off the CHG Soap.                9.  Pat yourself dry with a clean towel.            10.  Wear clean pajamas.            11.  Place clean sheets on your bed the night of your first shower and do not  sleep with pets. Day of Surgery : Do not apply any lotions/deodorants the morning of surgery.  Please wear clean clothes to the hospital/surgery center.  FAILURE TO FOLLOW THESE INSTRUCTIONS MAY RESULT IN THE  CANCELLATION OF YOUR SURGERY  PATIENT SIGNATURE_________________________________  NURSE SIGNATURE__________________________________  ________________________________________________________________________

## 2023-05-13 NOTE — Progress Notes (Signed)
 COVID Vaccine received:  []  No []  Yes Date of any COVID positive Test in last 90 days:  PCP - Camie Doing NP Cardiologist -   Chest x-ray -Chest CT 11/18/22 Epic EKG -  04/26/23 Epic Stress Test -  ECHO -  Cardiac Cath -   Bowel Prep - []  No  []   Yes ______  Pacemaker / ICD device []  No []  Yes   Spinal Cord Stimulator:[]  No []  Yes       History of Sleep Apnea? []  No []  Yes   CPAP used?- []  No []  Yes    Does the patient monitor blood sugar?          []  No []  Yes  []  N/A  Patient has: []  NO Hx DM   []  Pre-DM                 []  DM1  []   DM2 Does patient have a [employer] or [employer]? []  No []  Yes   Fasting Blood Sugar Ranges-  Checks Blood Sugar _____ times a day  GLP1 agonist / usual dose -  GLP1 instructions:  SGLT-2 inhibitors / usual dose -  SGLT-2 instructions:   Blood Thinner / Instructions: Aspirin Instructions:  Comments:   Activity level: Patient is able / unable to climb a flight of stairs without difficulty; []  No CP  []  No SOB, but would have ___   Patient can / can not perform ADLs without assistance.   Anesthesia review:   Patient denies shortness of breath, fever, cough and chest pain at PAT appointment.  Patient verbalized understanding and agreement to the Pre-Surgical Instructions that were given to them at this PAT appointment. Patient was also educated of the need to review these PAT instructions again prior to his/her surgery.I reviewed the appropriate phone numbers to call if they have any and questions or concerns.

## 2023-05-14 ENCOUNTER — Encounter (HOSPITAL_COMMUNITY)
Admission: RE | Admit: 2023-05-14 | Discharge: 2023-05-14 | Disposition: A | Discharge status: Home / Self Care | Payer: 59 | Source: Ambulatory Visit | Attending: Interventional Radiology | Admitting: Interventional Radiology

## 2023-05-14 ENCOUNTER — Encounter: Payer: Self-pay | Admitting: Nurse Practitioner

## 2023-05-14 ENCOUNTER — Encounter (HOSPITAL_COMMUNITY): Payer: Self-pay

## 2023-05-14 ENCOUNTER — Other Ambulatory Visit: Payer: Self-pay

## 2023-05-14 VITALS — Ht 65.0 in | Wt 260.0 lb

## 2023-05-14 DIAGNOSIS — I10 Essential (primary) hypertension: Secondary | ICD-10-CM

## 2023-05-14 DIAGNOSIS — E11628 Type 2 diabetes mellitus with other skin complications: Secondary | ICD-10-CM

## 2023-05-14 HISTORY — DX: Anemia, unspecified: D64.9

## 2023-05-14 NOTE — Progress Notes (Addendum)
 Patient interviewed over the phone. Will need labs DOS. Emailed instructions to patient. She has leftover CHG soap from last surgery to use for showers. Transferred call to admitting.   COVID Vaccine Completed:  Date of COVID positive in last 90 days: no  PCP - Camie Doing, NP Cardiologist -   CT- 11/18/22 Epic Chest x-ray - n/a EKG - 04/26/23 Epic Stress Test - n/a ECHO - n/a Cardiac Cath - n/a Pacemaker/ICD device last checked: n/a Spinal Cord Stimulator: n/a  Bowel Prep - no  Sleep Study - n/a CPAP -   Fasting Blood Sugar - 150-210 Checks Blood Sugar  not in past few days  Last dose of GLP1 agonist-  N/A GLP1 instructions:  Hold 7 days before surgery    Last dose of SGLT-2 inhibitors-  N/A SGLT-2 instructions:  Hold 3 days before surgery    Blood Thinner Instructions: n/a Aspirin Instructions: Last Dose:  Activity level: Can go up a flight of stairs and perform activities of daily living without stopping and without symptoms of chest pain or shortness of breath.   Anesthesia review:   Patient denies shortness of breath, fever, cough and chest pain at PAT appointment  Patient verbalized understanding of instructions that were given to them at the PAT appointment. Patient was also instructed that they will need to review over the PAT instructions again at home before surgery.

## 2023-05-16 ENCOUNTER — Other Ambulatory Visit: Payer: Self-pay | Admitting: *Deleted

## 2023-05-16 NOTE — Progress Notes (Signed)
 The proposed treatment discussed in conference is for discussion purpose only and is not a binding recommendation.  The patients have not been physically examined, or presented with their treatment options.  Therefore, final treatment plans cannot be decided.

## 2023-05-20 ENCOUNTER — Other Ambulatory Visit: Payer: Self-pay | Admitting: Radiology

## 2023-05-20 DIAGNOSIS — C189 Malignant neoplasm of colon, unspecified: Secondary | ICD-10-CM

## 2023-05-21 NOTE — H&P (Signed)
Chief Complaint: Metastatic colon cancer to liver; referred for image guided microwave ablation of liver tumor  Referring Provider(s): Feng,Y  Supervising Physician: Gilmer Mor  Patient Status: [facility] - Out-pt  History of Present Illness: Tammy Marks is a 58 y.o. female with past medical history significant for anemia, diabetes, and recently diagnosed colon cancer with metastatic disease to the liver.  She is status post left hemicolectomy for synchronous colon cancers on 05/03/2023.  She is known to IR team from Port-A-Cath placement and right liver mass (segment 7) biopsy on 12/03/2022 (path- met colon ca).  She also underwent consultation with Dr. Loreta Ave on 03/27/2023 to discuss treatment options for her metastatic liver tumor and was deemed an appropriate candidate for image guided microwave ablation of the segment 7 liver lesion.  She presents today for the procedure.  A second liver lesion in segment 8 will not be treatable with ablation.  This lesion is small on PET with no CT/ MRI/US correlate.   Patient is Full Code  Past Medical History:  Diagnosis Date   Anemia    Cancer (HCC)    Colon cancer (HCC) 10/30/2022   surgery 05/03/23   Diabetes mellitus without complication [facility])    Personal history of chemotherapy 2024   colon ca    Past Surgical History:  Procedure Laterality Date   CESAREAN SECTION     CHOLECYSTECTOMY     IR IMAGING GUIDED PORT INSERTION  12/03/2022   IR RADIOLOGIST EVAL & MGMT  03/27/2023   IR US LIVER BIOPSY  12/03/2022    Allergies: Oxaliplatin  Medications: Prior to Admission medications   Medication Sig Start Date End Date Taking? Authorizing Provider  atorvastatin (LIPITOR) 20 MG tablet Take 20 mg by mouth daily.    [provider]  Cholecalciferol (VITAMIN D3) 50 MCG (2000 UT) TABS Take 1,000 Units by mouth daily.    [provider]  gabapentin (NEURONTIN) 100 MG capsule Take 1 capsule po at bedtime. May increase to 2  capsules daily as needed and as tolerated. 04/22/23   Carlean Jews, NP  glipiZIDE (GLUCOTROL XL) 10 MG 24 hr tablet Take 10 mg by mouth daily.    [provider]  Iron, Ferrous Sulfate, 325 (65 Fe) MG TABS Take 325 mg by mouth daily. 02/13/19   Grayce Sessions, NP  lidocaine-prilocaine (EMLA) cream Apply to affected area once Patient not taking: Reported on 04/23/2023 12/20/22   Malachy Mood, MD  metFORMIN (GLUCOPHAGE-XR) 500 MG 24 hr tablet Take 1,000 mg by mouth 2 (two) times daily.    [provider]  ondansetron (ZOFRAN) 8 MG tablet Take 1 tablet (8 mg total) by mouth every 8 (eight) hours as needed for nausea or vomiting. Start on the third day after chemotherapy. Patient taking differently: Take 4 mg by mouth every 8 (eight) hours as needed for nausea or vomiting. Start on the third day after chemotherapy. 12/20/22   Malachy Mood, MD  polyethylene glycol powder (GLYCOLAX/MIRALAX) 17 GM/SCOOP powder Take 17 g by mouth daily as needed for mild constipation or moderate constipation. 03/21/23   [provider]  potassium chloride SA (KLOR-CON M) 20 MEQ tablet Take 1 tablet (20 mEq total) by mouth 2 (two) times daily. Take 1 tablet 3 times daily for 3 days, then twice daily Patient taking differently: Take 20 mEq by mouth daily. 01/29/23   Malachy Mood, MD  prochlorperazine (COMPAZINE) 10 MG tablet Take 1 tablet (10 mg total) by mouth every 6 (  six) hours as needed for nausea or vomiting. 12/20/22   Malachy Mood, MD  traMADol (ULTRAM) 50 MG tablet Take 1-2 tablets (50-100 mg total) by mouth every 6 (six) hours as needed for moderate pain (pain score 4-6). 05/05/23   Romie Levee, MD     Family History  Problem Relation Age of Onset   Cancer Father 54       gastric cancer    Social History   Socioeconomic History   Marital status: Single    Spouse name: Not on file   Number of children: 2   Years of education: Not on file   Highest education level: Not on file   Occupational History   Not on file  Tobacco Use   Smoking status: Never   Smokeless tobacco: Never  Vaping Use   Vaping status: Never Used  Substance and Sexual Activity   Alcohol use: Not Currently   Drug use: Never   Sexual activity: Not Currently  Other Topics Concern   Not on file  Social History Narrative   Not on file   Social Drivers of Health   Financial Resource Strain: Not on file  Food Insecurity: No Food Insecurity (05/03/2023)   Hunger Vital Sign    Worried About Running Out of Food in the Last Year: Never true    Ran Out of Food in the Last Year: Never true  Transportation Needs: No Transportation Needs (05/03/2023)   PRAPARE - Administrator, Civil Service (Medical): No    Lack of Transportation (Non-Medical): No  Physical Activity: Not on file  Stress: Not on file  Social Connections: Moderately Isolated (05/03/2023)   Social Connection and Isolation Panel [NHANES]    Frequency of Communication with Friends and Family: More than three times a week    Frequency of Social Gatherings with Friends and Family: Once a week    Attends Religious Services: More than 4 times per year    Active Member of [employer] or Organizations: No    Attends Engineer, structural: Never    Marital Status: Never married       Review of Systems; denies fever,HA,CP,dyspnea, cough, abd/back pain,N/V or bleeding  Vital Signs: temp 99.2,  BP 133/89  HR 81  R 18  O2 SATS 99% RA  LMP 06/20/2020 (Exact Date)   Advance Care Plan: no documents on file  Physical Exam: awake/alert; chest- CTA bilat; clean, intact rt chest wall port a cath; heart- RRR; abd-soft,obese,+BS,NT; ext with FROM  Imaging: MM 3D SCREENING MAMMOGRAM BILATERAL BREAST Result Date: 05/13/2023 CLINICAL DATA:  Screening. Technologist indicates best positioning/images possible. EXAM: DIGITAL SCREENING BILATERAL MAMMOGRAM WITH TOMOSYNTHESIS AND CAD TECHNIQUE: Bilateral screening digital craniocaudal and  mediolateral oblique mammograms were obtained. Bilateral screening digital breast tomosynthesis was performed. The images were evaluated with computer-aided detection. COMPARISON:  Previous exam(s). ACR Breast Density Category a: The breasts are almost entirely fatty. FINDINGS: There are no findings suspicious for malignancy. IMPRESSION: No mammographic evidence of malignancy. A result letter of this screening mammogram will be mailed directly to the patient. RECOMMENDATION: Screening mammogram in one year. (Code:SM-B-01Y) BI-RADS CATEGORY  1: Negative. Electronically Signed   By: Sherron Ales M.D.   On: 05/13/2023 16:25    Labs:  CBC: Recent Labs    04/18/23 0905 04/26/23 1418 05/04/23 0500 05/05/23 0216  WBC 7.6 8.7 10.2 8.4  HGB 9.3* 9.6* 8.1* 8.0*  HCT 28.8* 31.2* 25.8* 25.7*  PLT 348 373 348 333  COAGS: Recent Labs    12/03/22 0701  INR 1.1    BMP: Recent Labs    04/18/23 0905 04/26/23 1418 05/04/23 0500 05/05/23 0216  NA 138 136 135 137  K 3.8 3.5 4.4 3.8  CL 102 101 99 102  CO2 28 26 24 26   GLUCOSE 155* 245* 256* 210*  BUN 10 10 7 8   CALCIUM 9.3 8.8* 8.0* 8.4*  CREATININE 0.61 0.44 0.70 0.67  GFRNONAA >60 >60 >60 >60    LIVER FUNCTION TESTS: Recent Labs    02/27/23 1011 03/11/23 1046 03/28/23 1009 04/18/23 0905  BILITOT 0.4 0.3 0.4 0.4  AST 13* 13* 12* 13*  ALT 11 11 11 11   ALKPHOS 65 70 73 73  PROT 7.2 7.2 7.1 7.2  ALBUMIN 3.7 3.8 3.9 3.7    TUMOR MARKERS: Recent Labs    01/29/23 0917 02/27/23 1011 03/28/23 1009 04/18/23 0905  CEA 6.01* 3.22 2.04 1.64    Assessment and Plan: 58 y.o. female with past medical history significant for anemia, diabetes, and recently diagnosed colon cancer with metastatic disease to the liver.  She is status post left hemicolectomy for synchronous colon cancers on 05/03/2023.  She is known to IR team from Port-A-Cath placement and right liver mass (segment 7) biopsy on 12/03/2022 (path- met colon ca).  She also  underwent consultation with Dr. Loreta Ave on 03/27/2023 to discuss treatment options for her metastatic liver tumor and was deemed an appropriate candidate for image guided microwave ablation of the segment 7 liver lesion.  She presents today for the procedure.  A second liver lesion in segment 8 will not be treatable with ablation.  This lesion is small on PET with no CT/ MRI/US correlate.Risks and benefits of procedure was discussed with the patient  including, but not limited to bleeding, infection, damage to adjacent structures , anesthesia related complications.  All of the questions were answered and there is agreement to proceed.  Consent signed and in chart.  This procedure involves the use of CT and because of the nature of the planned procedure, it is possible that we will have prolonged use of CT.  Potential radiation risks to you include (but are not limited to) the following: - A slightly elevated risk for cancer  several years later in life. This risk is typically less than 0.5% percent. This risk is low in comparison to the normal incidence of human cancer, which is 33% for women and 50% for men according to the American Cancer Society. - Radiation induced injury can include skin redness, resembling a rash, tissue breakdown / ulcers and hair loss (which can be temporary or permanent).   The likelihood of either of these occurring depends on the difficulty of the procedure and whether you are sensitive to radiation due to previous procedures, disease, or genetic conditions.   IF your procedure requires a prolonged use of radiation, you will be notified and given written instructions for further action.  It is your responsibility to monitor the irradiated area for the 2 weeks following the procedure and to notify your physician if you are concerned that you have suffered a radiation induced injury.      Thank you for allowing our service to participate in Tammy Marks 's  care.  Electronically Signed: D. Jeananne Rama, PA-C   05/21/2023, 3:14 PM      I spent a total of    25 Minutes in face to face in clinical consultation, greater than 50% of which  was counseling/coordinating care for image guided microwave ablation of liver tumor(metastatic colon cancer)

## 2023-05-21 NOTE — Anesthesia Preprocedure Evaluation (Signed)
Anesthesia Evaluation  Patient identified by MRN, date of birth, ID band Patient awake    Reviewed: Allergy & Precautions, NPO status , Patient's Chart, lab work & pertinent test results  Airway Mallampati: IV  TM Distance: >3 FB Neck ROM: Full    Dental  (+) Teeth Intact, Dental Advisory Given   Pulmonary neg pulmonary ROS   Pulmonary exam normal breath sounds clear to auscultation       Cardiovascular hypertension (133/89 no home meds), Normal cardiovascular exam Rhythm:Regular Rate:Normal     Neuro/Psych negative neurological ROS  negative psych ROS   GI/Hepatic Neg liver ROS,,,CRC met to liver   Endo/Other  diabetes, Well Controlled, Type 2, Oral Hypoglycemic Agents  Class 3 obesityBMI 43  Renal/GU negative Renal ROS  negative genitourinary   Musculoskeletal negative musculoskeletal ROS (+)    Abdominal  (+) + obese  Peds  Hematology negative hematology ROS (+)   Anesthesia Other Findings   Reproductive/Obstetrics negative OB ROS                              Anesthesia Physical Anesthesia Plan  ASA: 3  Anesthesia Plan: General   Post-op Pain Management:    Induction: Intravenous  PONV Risk Score and Plan: 3 and Ondansetron, Dexamethasone, Midazolam and Treatment may vary due to age or medical condition  Airway Management Planned: Oral ETT  Additional Equipment: None  Intra-op Plan:   Post-operative Plan: Extubation in OR  Informed Consent: I have reviewed the patients History and Physical, chart, labs and discussed the procedure including the risks, benefits and alternatives for the proposed anesthesia with the patient or authorized representative who has indicated his/her understanding and acceptance.     Dental advisory given  Plan Discussed with: CRNA  Anesthesia Plan Comments: (Last airway note: Laryngoscope Size: Mac and 3 Grade View: Grade II Tube type:  Oral Tube size: 7.0 mm Number of attempts: 1 )        Anesthesia Quick Evaluation

## 2023-05-22 ENCOUNTER — Encounter (HOSPITAL_COMMUNITY)
Admission: RE | Disposition: A | Discharge status: Home / Self Care | Payer: Self-pay | Source: Home / Self Care | Attending: Interventional Radiology

## 2023-05-22 ENCOUNTER — Ambulatory Visit (HOSPITAL_BASED_OUTPATIENT_CLINIC_OR_DEPARTMENT_OTHER): Payer: 59 | Admitting: Anesthesiology

## 2023-05-22 ENCOUNTER — Ambulatory Visit (HOSPITAL_COMMUNITY): Payer: 59

## 2023-05-22 ENCOUNTER — Encounter (HOSPITAL_COMMUNITY): Payer: Self-pay

## 2023-05-22 ENCOUNTER — Ambulatory Visit (HOSPITAL_COMMUNITY): Payer: 59 | Admitting: Anesthesiology

## 2023-05-22 ENCOUNTER — Other Ambulatory Visit: Payer: Self-pay

## 2023-05-22 ENCOUNTER — Ambulatory Visit (HOSPITAL_COMMUNITY)
Admission: RE | Admit: 2023-05-22 | Discharge: 2023-05-22 | Disposition: A | Discharge status: Home / Self Care | Payer: 59 | Attending: Interventional Radiology | Admitting: Interventional Radiology

## 2023-05-22 ENCOUNTER — Ambulatory Visit (HOSPITAL_COMMUNITY)
Admission: RE | Admit: 2023-05-22 | Discharge: 2023-05-22 | Disposition: A | Discharge status: Home / Self Care | Payer: 59 | Source: Ambulatory Visit | Attending: Interventional Radiology | Admitting: Interventional Radiology

## 2023-05-22 ENCOUNTER — Encounter (HOSPITAL_COMMUNITY): Payer: Self-pay | Admitting: Interventional Radiology

## 2023-05-22 DIAGNOSIS — C787 Secondary malignant neoplasm of liver and intrahepatic bile duct: Secondary | ICD-10-CM | POA: Insufficient documentation

## 2023-05-22 DIAGNOSIS — Z7984 Long term (current) use of oral hypoglycemic drugs: Secondary | ICD-10-CM | POA: Insufficient documentation

## 2023-05-22 DIAGNOSIS — K769 Liver disease, unspecified: Secondary | ICD-10-CM

## 2023-05-22 DIAGNOSIS — E119 Type 2 diabetes mellitus without complications: Secondary | ICD-10-CM | POA: Insufficient documentation

## 2023-05-22 DIAGNOSIS — C189 Malignant neoplasm of colon, unspecified: Secondary | ICD-10-CM | POA: Insufficient documentation

## 2023-05-22 DIAGNOSIS — Z9049 Acquired absence of other specified parts of digestive tract: Secondary | ICD-10-CM | POA: Insufficient documentation

## 2023-05-22 DIAGNOSIS — K7689 Other specified diseases of liver: Secondary | ICD-10-CM

## 2023-05-22 DIAGNOSIS — I10 Essential (primary) hypertension: Secondary | ICD-10-CM

## 2023-05-22 DIAGNOSIS — E669 Obesity, unspecified: Secondary | ICD-10-CM | POA: Diagnosis not present

## 2023-05-22 DIAGNOSIS — E11628 Type 2 diabetes mellitus with other skin complications: Secondary | ICD-10-CM

## 2023-05-22 HISTORY — PX: RADIOLOGY WITH ANESTHESIA: SHX6223

## 2023-05-22 LAB — CBC WITH DIFFERENTIAL/PLATELET
Abs Immature Granulocytes: 0.03 10*3/uL (ref 0.00–0.07)
Basophils Absolute: 0.1 10*3/uL (ref 0.0–0.1)
Basophils Relative: 1 %
Eosinophils Absolute: 0.1 10*3/uL (ref 0.0–0.5)
Eosinophils Relative: 1 %
HCT: 30.1 % — ABNORMAL LOW (ref 36.0–46.0)
Hemoglobin: 9.2 g/dL — ABNORMAL LOW (ref 12.0–15.0)
Immature Granulocytes: 0 %
Lymphocytes Relative: 42 %
Lymphs Abs: 3.6 10*3/uL (ref 0.7–4.0)
MCH: 25.7 pg — ABNORMAL LOW (ref 26.0–34.0)
MCHC: 30.6 g/dL (ref 30.0–36.0)
MCV: 84.1 fL (ref 80.0–100.0)
Monocytes Absolute: 0.9 10*3/uL (ref 0.1–1.0)
Monocytes Relative: 10 %
Neutro Abs: 3.9 10*3/uL (ref 1.7–7.7)
Neutrophils Relative %: 46 %
Platelets: 581 10*3/uL — ABNORMAL HIGH (ref 150–400)
RBC: 3.58 MIL/uL — ABNORMAL LOW (ref 3.87–5.11)
RDW: 17.2 % — ABNORMAL HIGH (ref 11.5–15.5)
WBC: 8.6 10*3/uL (ref 4.0–10.5)
nRBC: 0 % (ref 0.0–0.2)

## 2023-05-22 LAB — COMPREHENSIVE METABOLIC PANEL
ALT: 8 U/L (ref 0–44)
AST: 19 U/L (ref 15–41)
Albumin: 3.4 g/dL — ABNORMAL LOW (ref 3.5–5.0)
Alkaline Phosphatase: 55 U/L (ref 38–126)
Anion gap: 14 (ref 5–15)
BUN: 12 mg/dL (ref 6–20)
CO2: 24 mmol/L (ref 22–32)
Calcium: 8.6 mg/dL — ABNORMAL LOW (ref 8.9–10.3)
Chloride: 97 mmol/L — ABNORMAL LOW (ref 98–111)
Creatinine, Ser: 0.75 mg/dL (ref 0.44–1.00)
GFR, Estimated: >60 mL/min (ref >60)
Glucose, Bld: 119 mg/dL — ABNORMAL HIGH (ref 70–99)
Potassium: 3.8 mmol/L (ref 3.5–5.1)
Sodium: 135 mmol/L (ref 135–145)
Total Bilirubin: 1 mg/dL (ref 0.0–1.2)
Total Protein: 7.9 g/dL (ref 6.5–8.1)

## 2023-05-22 LAB — GLUCOSE, CAPILLARY
Glucose-Capillary: 117 mg/dL — ABNORMAL HIGH (ref 70–99)
Glucose-Capillary: 100 mg/dL — ABNORMAL HIGH (ref 70–99)

## 2023-05-22 LAB — PROTIME-INR
INR: 1 (ref 0.8–1.2)
Prothrombin Time: 13.5 s (ref 11.4–15.2)

## 2023-05-22 SURGERY — CT WITH ANESTHESIA
Anesthesia: General

## 2023-05-22 MED ORDER — CHLORHEXIDINE GLUCONATE 0.12 % MT SOLN
15.0000 mL | Freq: Once | OROMUCOSAL | Status: AC
  Administered 2023-05-22: 15 mL via OROMUCOSAL

## 2023-05-22 MED ORDER — PROPOFOL 10 MG/ML IV BOLUS
INTRAVENOUS | Status: DC | PRN
  Administered 2023-05-22: 200 mg via INTRAVENOUS
  Administered 2023-05-22: 30 mg via INTRAVENOUS

## 2023-05-22 MED ORDER — LACTATED RINGERS IV SOLN: INTRAVENOUS | Status: DC | PRN

## 2023-05-22 MED ORDER — MIDAZOLAM HCL 5 MG/5ML IJ SOLN
INTRAMUSCULAR | Status: DC | PRN
  Administered 2023-05-22: 2 mg via INTRAVENOUS

## 2023-05-22 MED ORDER — CHLORHEXIDINE GLUCONATE CLOTH 2 % EX PADS
6.0000 | MEDICATED_PAD | Freq: Every day | CUTANEOUS | Status: DC
Start: 2023-05-22 — End: 2023-05-22

## 2023-05-22 MED ORDER — LACTATED RINGERS IV SOLN: INTRAVENOUS | Status: DC

## 2023-05-22 MED ORDER — ONDANSETRON HCL 4 MG/2ML IJ SOLN
INTRAMUSCULAR | Status: DC | PRN
  Administered 2023-05-22: 4 mg via INTRAVENOUS

## 2023-05-22 MED ORDER — SUGAMMADEX SODIUM 200 MG/2ML IV SOLN
INTRAVENOUS | Status: DC | PRN
  Administered 2023-05-22: 200 mg via INTRAVENOUS

## 2023-05-22 MED ORDER — FENTANYL CITRATE (PF) 100 MCG/2ML IJ SOLN
INTRAMUSCULAR | Status: AC
  Filled 2023-05-22: qty 2

## 2023-05-22 MED ORDER — LIDOCAINE HCL (CARDIAC) PF 100 MG/5ML IV SOSY
PREFILLED_SYRINGE | INTRAVENOUS | Status: DC | PRN
  Administered 2023-05-22: 60 mg via INTRAVENOUS

## 2023-05-22 MED ORDER — INSULIN ASPART 100 UNIT/ML IJ SOLN: 0.0000 [IU] | INTRAMUSCULAR | Status: DC | PRN

## 2023-05-22 MED ORDER — LACTATED RINGERS IV BOLUS
500.0000 mL | Freq: Once | INTRAVENOUS | Status: AC
  Administered 2023-05-22: 500 mL via INTRAVENOUS

## 2023-05-22 MED ORDER — SODIUM CHLORIDE 0.9 % IV SOLN
INTRAVENOUS | Status: DC
Start: 2023-05-22 — End: 2023-05-22

## 2023-05-22 MED ORDER — ORAL CARE MOUTH RINSE: 15.0000 mL | Freq: Once | OROMUCOSAL | Status: AC

## 2023-05-22 MED ORDER — FENTANYL CITRATE PF 50 MCG/ML IJ SOSY
PREFILLED_SYRINGE | INTRAMUSCULAR | Status: AC
  Filled 2023-05-22: qty 2

## 2023-05-22 MED ORDER — IOHEXOL 300 MG/ML  SOLN
100.0000 mL | Freq: Once | INTRAMUSCULAR | Status: AC | PRN
  Administered 2023-05-22: 100 mL via INTRAVENOUS

## 2023-05-22 MED ORDER — FENTANYL CITRATE (PF) 100 MCG/2ML IJ SOLN
INTRAMUSCULAR | Status: DC | PRN
  Administered 2023-05-22 (×4): 50 ug via INTRAVENOUS

## 2023-05-22 MED ORDER — ROCURONIUM BROMIDE 100 MG/10ML IV SOLN
INTRAVENOUS | Status: DC | PRN
  Administered 2023-05-22: 100 mg via INTRAVENOUS

## 2023-05-22 MED ORDER — SODIUM CHLORIDE 0.9 % IV SOLN
INTRAVENOUS | Status: AC
  Filled 2023-05-22: qty 2

## 2023-05-22 MED ORDER — FENTANYL CITRATE PF 50 MCG/ML IJ SOSY
25.0000 ug | PREFILLED_SYRINGE | INTRAMUSCULAR | Status: DC | PRN
  Administered 2023-05-22: 50 ug via INTRAVENOUS

## 2023-05-22 MED ORDER — ONDANSETRON HCL 4 MG/2ML IJ SOLN: 4.0000 mg | Freq: Once | INTRAMUSCULAR | Status: DC | PRN

## 2023-05-22 MED ORDER — DEXTROSE 5 % IV SOLN
INTRAVENOUS | Status: DC | PRN
  Administered 2023-05-22: 2 g via INTRAVENOUS

## 2023-05-22 MED ORDER — MIDAZOLAM HCL 2 MG/2ML IJ SOLN
INTRAMUSCULAR | Status: AC
  Filled 2023-05-22: qty 2

## 2023-05-22 MED ORDER — SODIUM CHLORIDE 0.9 % IV SOLN
2.0000 g | Freq: Once | INTRAVENOUS | Status: DC
  Filled 2023-05-22: qty 2

## 2023-05-22 NOTE — Anesthesia Postprocedure Evaluation (Signed)
Anesthesia Post Note  Patient: Tammy Marks  Procedure(s) Performed: CT MICROWAVE ABLATION WITH ANESTHESIA     Patient location during evaluation: PACU Anesthesia Type: General Level of consciousness: awake and alert, oriented and patient cooperative Pain management: pain level controlled Vital Signs Assessment: post-procedure vital signs reviewed and stable Respiratory status: spontaneous breathing, nonlabored ventilation and respiratory function stable Cardiovascular status: blood pressure returned to baseline and stable Postop Assessment: no apparent nausea or vomiting Anesthetic complications: no   No notable events documented.  Last Vitals:  Vitals:   05/22/23 1145 05/22/23 1200  BP: (!) 169/83 (!) 167/82  Pulse: 81 89  Resp: 20 17  Temp:    SpO2: 100% 94%    Last Pain:  Vitals:   05/22/23 1208  PainSc: 2                  Lannie Fields

## 2023-05-22 NOTE — Progress Notes (Signed)
Patient ID: Tammy Marks, female   DOB: 1965/11/27, 58 y.o.   MRN: [id number] Pt seen in PACU by Dr. Loreta Ave, s/p MWA seg 7 liver tumor earlier today; no new c/o; VSS/afebrile; puncture site RUQ abd clean and dry; foley dc'd and pt able to void; pt discharged home ; will plan f/u with Dr. Loreta Ave in IR clinic in 4 weeks

## 2023-05-22 NOTE — Procedures (Signed)
Interventional Radiology Procedure Note  Procedure:   Image guided tissue ablation microwave technology, CT and US guidance.   Anesthesia: GETA  Findings: The second, more superficial lesion ID'd on PET is not visible on [place] or CT. We could not target this for fiducial marking today. .  Complications: None  Recommendations:  -  Stable to PACU - Remove foley in PACU - ice prn - routine wound care - advance diet - anticipate DC in 2 hours when goals met after GETA.  Street ready criteria - Do not submerge for 7 days - Follow up with Dr. Loreta Ave in clinic in ~4 weeks  Signed,  Yvone Neu. Loreta Ave, DO, ABVM, RPVI

## 2023-05-22 NOTE — Anesthesia Procedure Notes (Signed)
Procedure Name: Intubation Date/Time: 05/22/2023 9:08 AM  Performed by: Micki Riley, CRNAPre-anesthesia Checklist: Patient identified, Emergency Drugs available, Suction available, Patient being monitored and Timeout performed Patient Re-evaluated:Patient Re-evaluated prior to induction Oxygen Delivery Method: Circle system utilized Preoxygenation: Pre-oxygenation with 100% oxygen Induction Type: IV induction Ventilation: Mask ventilation without difficulty Tube type: Oral Nasal Tubes: Right Tube size: 7.0 mm Number of attempts: 1 Placement Confirmation: ETT inserted through vocal cords under direct vision Secured at: 20 cm Tube secured with: Tape Dental Injury: Teeth and Oropharynx as per pre-operative assessment

## 2023-05-22 NOTE — Transfer of Care (Signed)
Immediate Anesthesia Transfer of Care Note  Patient: Tammy Marks  Procedure(s) Performed: CT MICROWAVE ABLATION WITH ANESTHESIA  Patient Location: PACU  Anesthesia Type:General  Level of Consciousness: awake  Airway & Oxygen Therapy: Patient Spontanous Breathing  Post-op Assessment: Report given to RN  Post vital signs: stable  Last Vitals:  Vitals Value Taken Time  BP 164/92 05/22/23 1137  Temp 36.9 C 05/22/23 1137  Pulse 88 05/22/23 1140  Resp 22 05/22/23 1140  SpO2 100 % 05/22/23 1140  Vitals shown include unfiled device data.  Last Pain:  Vitals:   05/22/23 0655  PainSc: 0-No pain      Patients Stated Pain Goal: 3 (05/22/23 1308)  Complications: No notable events documented.

## 2023-05-23 ENCOUNTER — Encounter (HOSPITAL_COMMUNITY): Payer: Self-pay | Admitting: Interventional Radiology

## 2023-05-29 ENCOUNTER — Other Ambulatory Visit: Payer: Self-pay

## 2023-05-30 ENCOUNTER — Telehealth: Payer: Self-pay | Admitting: Hematology

## 2023-05-30 NOTE — Telephone Encounter (Signed)
 Patient is aware scheduled appointment times/dates

## 2023-06-04 ENCOUNTER — Inpatient Hospital Stay: Payer: 59 | Attending: Hematology | Admitting: Hematology

## 2023-06-04 DIAGNOSIS — C186 Malignant neoplasm of descending colon: Secondary | ICD-10-CM

## 2023-06-04 NOTE — Progress Notes (Signed)
 Called twice today for her phone visit, no answers, left VM for her to call back and reschedule.   Malachy Mood  06/04/2023

## 2023-06-04 NOTE — Assessment & Plan Note (Addendum)
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  The left descending colon cancer was only 4 mm, removed by polypectomy. The largest 3 cm, fungating mass in the rectosigmoid junction is 15 cm from anal verge on colonoscopy, biopsy confirmed moderate differentiated adenocarcinoma -I reviewed her staging CT chest from November 18, 2022, which showed no definitive evidence of metastasis, but showed borderline enlarged thoracic adenopathy.  I will obtain a PET scan for further evaluation. -Her liver MRI unfortunately showed a 1.2 cm mass in the posterior dome of liver, segment 7, biopsy on 12/03/22 confirmed metastasis from colon cancer, I reviewed with her.  -Given her metastatic disease, I recommend chemotherapy first.  She started FOLFOX on 12/18/2022 and received 8 cycles before surgery -she underwent hemicolectomy on 05/03/23 and liver ablation on 05/22/23 by IR  -surgical path reviewed, ypT3N1b, surgical margins negative  -I recommend 2 more months adjuvant chemo FOLFOX

## 2023-06-05 ENCOUNTER — Other Ambulatory Visit: Payer: Self-pay

## 2023-06-12 NOTE — Assessment & Plan Note (Deleted)
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  The left descending colon cancer was only 4 mm, removed by polypectomy. The largest 3 cm, fungating mass in the rectosigmoid junction is 15 cm from anal verge on colonoscopy, biopsy confirmed moderate differentiated adenocarcinoma -I reviewed her staging CT chest from November 18, 2022, which showed no definitive evidence of metastasis, but showed borderline enlarged thoracic adenopathy.  I will obtain a PET scan for further evaluation. -Her liver MRI unfortunately showed a 1.2 cm mass in the posterior dome of liver, segment 7, biopsy on 12/03/22 confirmed metastasis from colon cancer, I reviewed with her.  -Given her metastatic disease, I recommend chemotherapy first.  She started FOLFOX on 12/18/2022 and received 8 cycles before surgery -she underwent hemicolectomy on 05/03/23 and liver ablation on 05/22/23 by IR  -surgical path reviewed, ypT3N1b, surgical margins negative  -I recommend 2 more months adjuvant chemo FOLFOX

## 2023-06-13 ENCOUNTER — Inpatient Hospital Stay: Attending: Hematology | Admitting: Hematology

## 2023-06-13 ENCOUNTER — Encounter: Payer: Self-pay | Admitting: Hematology

## 2023-06-13 ENCOUNTER — Telehealth: Payer: Self-pay | Admitting: Pharmacy Technician

## 2023-06-13 ENCOUNTER — Other Ambulatory Visit (HOSPITAL_COMMUNITY): Payer: Self-pay

## 2023-06-13 ENCOUNTER — Inpatient Hospital Stay: Payer: 59 | Attending: Hematology

## 2023-06-13 ENCOUNTER — Inpatient Hospital Stay: Payer: 59 | Admitting: Hematology

## 2023-06-13 ENCOUNTER — Telehealth: Payer: Self-pay | Admitting: Hematology

## 2023-06-13 ENCOUNTER — Telehealth: Payer: Self-pay | Admitting: Pharmacist

## 2023-06-13 DIAGNOSIS — C19 Malignant neoplasm of rectosigmoid junction: Secondary | ICD-10-CM | POA: Diagnosis not present

## 2023-06-13 DIAGNOSIS — Z79899 Other long term (current) drug therapy: Secondary | ICD-10-CM | POA: Insufficient documentation

## 2023-06-13 DIAGNOSIS — C186 Malignant neoplasm of descending colon: Secondary | ICD-10-CM

## 2023-06-13 DIAGNOSIS — C787 Secondary malignant neoplasm of liver and intrahepatic bile duct: Secondary | ICD-10-CM | POA: Diagnosis not present

## 2023-06-13 DIAGNOSIS — D649 Anemia, unspecified: Secondary | ICD-10-CM | POA: Insufficient documentation

## 2023-06-13 MED ORDER — CAPECITABINE 500 MG PO TABS: ORAL_TABLET | ORAL | 0 refills | Status: DC

## 2023-06-13 NOTE — Progress Notes (Signed)
 [facility]   Telephone:([phone]) [phone] Fax:([phone]) [phone]   Clinic Follow up Note   Patient Care Team: Early, Sung Amabile, NP as PCP - General (Nurse Practitioner) Malachy Mood, MD as Consulting Physician (Hematology and Oncology) Romie Levee, MD as Consulting Physician (General Surgery) Lynann Bologna, DO as Consulting Physician (Gastroenterology) 06/13/2023  I connected with Tammy Marks on 06/13/23 at  2:40 PM EST by telephone and verified that I am speaking with the correct person using two identifiers.   I discussed the limitations, risks, security and privacy concerns of performing an evaluation and management service by telephone and the availability of in person appointments. I also discussed with the patient that there may be a patient responsible charge related to this service. The patient expressed understanding and agreed to proceed.   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  The left descending colon cancer was only 4 mm, removed by polypectomy. The largest 3 cm, fungating mass in the rectosigmoid junction is 15 cm from anal verge on colonoscopy, biopsy confirmed moderate differentiated adenocarcinoma -I reviewed her staging CT chest from November 18, 2022, which showed no definitive evidence of metastasis, but showed borderline enlarged thoracic adenopathy.  I will obtain a PET scan for further evaluation. -Her liver MRI unfortunately showed a 1.2 cm mass in the posterior dome of liver, segment 7, biopsy on 12/03/22 confirmed metastasis from colon cancer, I reviewed with her.  -Given her metastatic disease, I recommend chemotherapy first.  She started FOLFOX on 12/18/2022  and received 8 cycles before surgery -she underwent hemicolectomy on 05/03/23 and liver ablation on 05/22/23 by IR  -surgical path reviewed, ypT3N1b, surgical margins negative  -I recommend 2 more months adjuvant chemo FOLFOX   Assessment and Plan    Metastatic colon cancer Following surgery and liver ablation, she has undergone significant chemotherapy, including FOLFOX, with two months remaining. Due to severe neuropathy, continuation of oxaliplatin is not feasible. Switching to Xeloda, an oral chemotherapy, was discussed as it is more tolerable and less likely to exacerbate neuropathy. Xeloda is associated with mild fatigue, occasional nausea, diarrhea, and potential skin inflammation, but is generally more tolerable than FOLFOX. She is at high risk for recurrence, necessitating close monitoring. Circulating tumor DNA testing will be conducted in a couple of months to assess for residual disease. Negative results would be a good sign, allowing completion of treatment and subsequent monitoring. - Prescribe Xeloda (capecitabine) for two months, to be taken twice a day for two weeks on, one week off.  Potential side effects, especially hand-foot syndrome, nausea, diarrhea, risks of infection, or discussed with her in detail, she agrees to proceed. - Order blood test prior to starting Xeloda. - Schedule CT scans every 3-6 months for monitoring. - Test circulating tumor DNA in a couple of months to assess for residual disease. - Schedule follow-up appointment during the off week of Xeloda treatment. - Coordinate with pharmacy for Xeloda delivery and patient education on administration.  Chemotherapy-induced peripheral neuropathy She reports severe neuropathy in both hands and feet, rated as 7/10 in severity, following chemotherapy. Gabapentin was initially effective but has since lost efficacy. The neuropathy does not affect her sleep but is bothersome during the day. Adjusting the  gabapentin dosage was  discussed, with potential alternative medications if needed. Lyrica or Cymbalta were mentioned as alternatives if gabapentin adjustment is insufficient. - Adjust gabapentin dosage: take one capsule in the morning, one in the afternoon, and three at night. Increase daytime dose if tolerated. - Consider alternative medications such as Lyrica or Cymbalta if gabapentin adjustment is insufficient. - Use Voltaren gel and Udderly Smooth body cream for skin inflammation if needed.      Plan -I reviewed her surgical pathology -I recommended 2 more months adjuvant therapy.  Due to significant neuropathy, oxaliplatin is not feasible.  I recommended Xeloda 2000 mg twice daily, 2 weeks on and 1 week off for 2 more months.  Prescription called in today, she will start on 3/17 -Follow-up in 2 weeks   SUMMARY OF ONCOLOGIC HISTORY: Oncology History  Cancer of left colon (HCC)  11/21/2022 Initial Diagnosis   Cancer of left colon (HCC)   12/18/2022 -  Chemotherapy   Patient is on Treatment Plan : COLORECTAL FOLFOX q14d     12/26/2022 PET scan   NM PET skull base to thigh  IMPRESSION: 1. Two hypermetabolic liver lesions consistent with metastatic disease. 2. Small hypermetabolic celiac axis and sigmoid mesocolon nodes suggesting metastatic adenopathy. 3. No findings for metastatic disease involving the chest or bony structures. 4. Diffuse hypermetabolism throughout the colon possibly related to recently eating or taking insulin making it difficult to identified the patient's sigmoid cancer.   05/03/2023 Cancer Staging   Staging form: Colon and Rectum, AJCC 8th Edition - Pathologic stage from 05/03/2023: Stage IVA (pT3, pN1b, pM1a) - Signed by Malachy Mood, MD on 06/04/2023 Histologic grading system: 4 grade system Histologic grade (G): G2 Residual tumor (R): R0     Discussed the use of AI scribe software for clinical note transcription with the patient, who gave verbal consent to proceed.  History of  Present Illness   Tammy Marks, a 58 year old female with a history of metastatic colon cancer, reports worsening neuropathy in her hands and feet following surgery and ablation. She describes the neuropathy as a 7 out of 10 in severity. She has been taking gabapentin, which initially provided relief but has since stopped working. She has tried increasing the dosage to three capsules in the evening, but this has not improved her symptoms. The neuropathy does not wake her up at night, but she feels it when she wakes up in the morning. She has tried massaging her hands and feet and taking B12 and vitamin D supplements to manage her symptoms. She has no issues with hand function or balance. She has been taking gabapentin only in the evening, as it makes her sleepy.         REVIEW OF SYSTEMS:   Constitutional: Denies fevers, chills or abnormal weight loss Eyes: Denies blurriness of vision Ears, nose, mouth, throat, and face: Denies mucositis or sore throat Respiratory: Denies cough, dyspnea or wheezes Cardiovascular: Denies palpitation, chest discomfort or lower extremity swelling Gastrointestinal:  Denies nausea, heartburn or change in bowel habits Skin: Denies abnormal skin rashes Lymphatics: Denies new lymphadenopathy or easy bruising Neurological:Denies numbness, tingling or new weaknesses Behavioral/Psych: Mood is stable, no new changes  All other systems were reviewed with the patient and are negative.  MEDICAL HISTORY:  Past Medical History:  Diagnosis Date   Anemia    Cancer (HCC)    Colon cancer (HCC) 10/30/2022   surgery 05/03/23   Diabetes mellitus without complication (HCC)    Personal history of chemotherapy  2024   colon ca    SURGICAL HISTORY: Past Surgical History:  Procedure Laterality Date   CESAREAN SECTION     CHOLECYSTECTOMY     IR IMAGING GUIDED PORT INSERTION  12/03/2022   IR RADIOLOGIST EVAL & MGMT  03/27/2023   IR US LIVER BIOPSY  12/03/2022   RADIOLOGY WITH  ANESTHESIA N/A 05/22/2023   Procedure: CT MICROWAVE ABLATION WITH ANESTHESIA;  Surgeon: Gilmer Mor, DO;  Location: [facility];  Service: Anesthesiology;  Laterality: N/A;    I have reviewed the social history and family history with the patient and they are unchanged from previous note.  ALLERGIES:  is allergic to oxaliplatin.  MEDICATIONS:  Current Outpatient Medications  Medication Sig Dispense Refill   capecitabine (XELODA) 500 MG tablet Take 4 tabs every 10-12 hours, take 30 mins after meals. Take for 14 days then off for 7 days 112 tablet 0   atorvastatin (LIPITOR) 20 MG tablet Take 20 mg by mouth daily.     Cholecalciferol (VITAMIN D3) 50 MCG (2000 UT) TABS Take 1,000 Units by mouth daily.     gabapentin (NEURONTIN) 100 MG capsule Take 1 capsule po at bedtime. May increase to 2 capsules daily as needed and as tolerated. 60 capsule 0   glipiZIDE (GLUCOTROL XL) 10 MG 24 hr tablet Take 10 mg by mouth daily.     Iron, Ferrous Sulfate, 325 (65 Fe) MG TABS Take 325 mg by mouth daily. 30 tablet 11   lidocaine-prilocaine (EMLA) cream Apply to affected area once (Patient not taking: Reported on 04/23/2023) 30 g 3   metFORMIN (GLUCOPHAGE-XR) 500 MG 24 hr tablet Take 1,000 mg by mouth 2 (two) times daily.     ondansetron (ZOFRAN) 8 MG tablet Take 1 tablet (8 mg total) by mouth every 8 (eight) hours as needed for nausea or vomiting. Start on the third day after chemotherapy. (Patient taking differently: Take 4 mg by mouth every 8 (eight) hours as needed for nausea or vomiting. Start on the third day after chemotherapy.) 30 tablet 1   polyethylene glycol powder (GLYCOLAX/MIRALAX) 17 GM/SCOOP powder Take 17 g by mouth daily as needed for mild constipation or moderate constipation.     potassium chloride SA (KLOR-CON M) 20 MEQ tablet Take 1 tablet (20 mEq total) by mouth 2 (two) times daily. Take 1 tablet 3 times daily for 3 days, then twice daily (Patient taking differently: Take 20 mEq by mouth daily.) 60  tablet 1   prochlorperazine (COMPAZINE) 10 MG tablet Take 1 tablet (10 mg total) by mouth every 6 (six) hours as needed for nausea or vomiting. 30 tablet 1   traMADol (ULTRAM) 50 MG tablet Take 1-2 tablets (50-100 mg total) by mouth every 6 (six) hours as needed for moderate pain (pain score 4-6). 30 tablet 0   No current facility-administered medications for this visit.    PHYSICAL EXAMINATION: Not performed   LABORATORY DATA:  I have reviewed the data as listed    Latest Ref Rng & Units 05/22/2023    7:00 AM 05/05/2023    2:16 AM 05/04/2023    5:00 AM  CBC  WBC 4.0 - 10.5 K/uL 8.6  8.4  10.2   Hemoglobin 12.0 - 15.0 g/dL 9.2  8.0  8.1   Hematocrit 36.0 - 46.0 % 30.1  25.7  25.8   Platelets 150 - 400 K/uL 581  333  348         Latest Ref Rng & Units 05/22/2023  7:00 AM 05/05/2023    2:16 AM 05/04/2023    5:00 AM  CMP  Glucose 70 - 99 mg/dL 161  096  045   BUN 6 - 20 mg/dL 12  8  7    Creatinine 0.44 - 1.00 mg/dL 4.09  8.11  9.14   Sodium 135 - 145 mmol/L 135  137  135   Potassium 3.5 - 5.1 mmol/L 3.8  3.8  4.4   Chloride 98 - 111 mmol/L 97  102  99   CO2 22 - 32 mmol/L 24  26  24    Calcium 8.9 - 10.3 mg/dL 8.6  8.4  8.0   Total Protein 6.5 - 8.1 g/dL 7.9     Total Bilirubin 0.0 - 1.2 mg/dL 1.0     Alkaline Phos 38 - 126 U/L 55     AST 15 - 41 U/L 19     ALT 0 - 44 U/L 8         RADIOGRAPHIC STUDIES: I have personally reviewed the radiological images as listed and agreed with the findings in the report. No results found.     I discussed the assessment and treatment plan with the patient. The patient was provided an opportunity to ask questions and all were answered. The patient agreed with the plan and demonstrated an understanding of the instructions.   The patient was advised to call back or seek an in-person evaluation if the symptoms worsen or if the condition fails to improve as anticipated.  I provided 25 minutes of non face-to-face telephone visit time  during this encounter, and > 50% was spent counseling as documented under my assessment & plan.     Malachy Mood, MD 06/13/23

## 2023-06-13 NOTE — Assessment & Plan Note (Signed)
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  The left descending colon cancer was only 4 mm, removed by polypectomy. The largest 3 cm, fungating mass in the rectosigmoid junction is 15 cm from anal verge on colonoscopy, biopsy confirmed moderate differentiated adenocarcinoma -I reviewed her staging CT chest from November 18, 2022, which showed no definitive evidence of metastasis, but showed borderline enlarged thoracic adenopathy.  I will obtain a PET scan for further evaluation. -Her liver MRI unfortunately showed a 1.2 cm mass in the posterior dome of liver, segment 7, biopsy on 12/03/22 confirmed metastasis from colon cancer, I reviewed with her.  -Given her metastatic disease, I recommend chemotherapy first.  She started FOLFOX on 12/18/2022 and received 8 cycles before surgery -she underwent hemicolectomy on 05/03/23 and liver ablation on 05/22/23 by IR  -surgical path reviewed, ypT3N1b, surgical margins negative  -I recommend 2 more months adjuvant chemo FOLFOX

## 2023-06-13 NOTE — Telephone Encounter (Signed)
 Left patient a voicemail in regards to scheduled appointment and left callback number for confirmation on this appointment; was able to reach patient's contact Tammy Marks) and he stated he would relay the message for her to call our office back in regards to scheduled appointment.

## 2023-06-13 NOTE — Telephone Encounter (Signed)
 Oral Oncology Patient Advocate Encounter  After completing a benefits investigation, prior authorization for capecitabine is not required at this time through [facility]  Patient must fill at [facility].     Omer Jack, CPhT-Adv Oncology Pharmacy Patient Advocate [facility]  Direct Number: [phone]  Fax: [phone]

## 2023-06-14 ENCOUNTER — Other Ambulatory Visit: Payer: Self-pay

## 2023-06-14 MED ORDER — CAPECITABINE 500 MG PO TABS: ORAL_TABLET | ORAL | 0 refills | Status: DC

## 2023-06-14 NOTE — Telephone Encounter (Signed)
 Oral Oncology Pharmacist Encounter  Received new prescription for Xeloda (capecitabine) for the treatment of metastatic colon cancer, planned duration 2 months.  CBC w/ Diff and CMP from 05/22/23 assessed, no relevant lab abnormalities requiring baseline dose adjustment required at this time. Prescription dose and frequency assessed for appropriateness.  Current medication list in Epic reviewed, DDIs with Xeloda identified: Category C DDI between Xeloda and Ondansetron due to risk of Qtc prolongation with fluorouracil products. Noted patient only taking PRN and PO route, risk higher with IV administration. No change in therapy warranted at this time.   Evaluated chart and no patient barriers to medication adherence noted.   Patient agreement for treatment documented in MD note on 06/13/23.  Patient's insurance requires that Xeloda be filled through [facility] - prescription has been redirected for dispensing.   Oral Oncology Clinic will continue to follow for insurance authorization, copayment issues, initial counseling and start date.  Sherry Ruffing, PharmD, BCPS, BCOP Hematology/Oncology Clinical Pharmacist [facility] Oral Chemotherapy Navigation Clinics [phone] 06/14/2023 8:56 AM

## 2023-06-16 ENCOUNTER — Other Ambulatory Visit: Payer: Self-pay

## 2023-06-17 ENCOUNTER — Inpatient Hospital Stay

## 2023-06-17 DIAGNOSIS — C186 Malignant neoplasm of descending colon: Secondary | ICD-10-CM

## 2023-06-17 DIAGNOSIS — Z95828 Presence of other vascular implants and grafts: Secondary | ICD-10-CM

## 2023-06-17 DIAGNOSIS — D649 Anemia, unspecified: Secondary | ICD-10-CM

## 2023-06-17 LAB — CBC WITH DIFFERENTIAL (CANCER CENTER ONLY)
Abs Immature Granulocytes: 0.02 10*3/uL (ref 0.00–0.07)
Basophils Absolute: 0 10*3/uL (ref 0.0–0.1)
Basophils Relative: 0 %
Eosinophils Absolute: 0.1 10*3/uL (ref 0.0–0.5)
Eosinophils Relative: 2 %
HCT: 29.8 % — ABNORMAL LOW (ref 36.0–46.0)
Hemoglobin: 9.4 g/dL — ABNORMAL LOW (ref 12.0–15.0)
Immature Granulocytes: 0 %
Lymphocytes Relative: 36 %
Lymphs Abs: 2.4 10*3/uL (ref 0.7–4.0)
MCH: 25.2 pg — ABNORMAL LOW (ref 26.0–34.0)
MCHC: 31.5 g/dL (ref 30.0–36.0)
MCV: 79.9 fL — ABNORMAL LOW (ref 80.0–100.0)
Monocytes Absolute: 0.5 10*3/uL (ref 0.1–1.0)
Monocytes Relative: 7 %
Neutro Abs: 3.8 10*3/uL (ref 1.7–7.7)
Neutrophils Relative %: 55 %
Platelet Count: 392 10*3/uL (ref 150–400)
RBC: 3.73 MIL/uL — ABNORMAL LOW (ref 3.87–5.11)
RDW: 16.5 % — ABNORMAL HIGH (ref 11.5–15.5)
WBC Count: 6.8 10*3/uL (ref 4.0–10.5)
nRBC: 0 % (ref 0.0–0.2)

## 2023-06-17 LAB — CMP (CANCER CENTER ONLY)
ALT: 11 U/L (ref 0–44)
AST: 15 U/L (ref 15–41)
Albumin: 3.8 g/dL (ref 3.5–5.0)
Alkaline Phosphatase: 70 U/L (ref 38–126)
Anion gap: 8 (ref 5–15)
BUN: 11 mg/dL (ref 6–20)
CO2: 28 mmol/L (ref 22–32)
Calcium: 9 mg/dL (ref 8.9–10.3)
Chloride: 103 mmol/L (ref 98–111)
Creatinine: 0.65 mg/dL (ref 0.44–1.00)
GFR, Estimated: >60 mL/min (ref >60)
Glucose, Bld: 117 mg/dL — ABNORMAL HIGH (ref 70–99)
Potassium: 3.7 mmol/L (ref 3.5–5.1)
Sodium: 139 mmol/L (ref 135–145)
Total Bilirubin: 0.3 mg/dL (ref 0.0–1.2)
Total Protein: 7.5 g/dL (ref 6.5–8.1)

## 2023-06-17 LAB — FERRITIN: Ferritin: 117 ng/mL (ref 11–307)

## 2023-06-17 LAB — CEA (ACCESS): CEA (CHCC): 4.25 ng/mL (ref 0.00–5.00)

## 2023-06-17 MED ORDER — SODIUM CHLORIDE 0.9% FLUSH
10.0000 mL | Freq: Once | INTRAVENOUS | Status: AC
Start: 2023-06-17 — End: 2023-06-17
  Administered 2023-06-17: 10 mL

## 2023-06-17 MED ORDER — HEPARIN SOD (PORK) LOCK FLUSH 100 UNIT/ML IV SOLN
500.0000 [IU] | Freq: Once | INTRAVENOUS | Status: AC
  Administered 2023-06-17: 500 [IU]

## 2023-06-18 NOTE — Telephone Encounter (Signed)
 Oral Chemotherapy Pharmacist Encounter  I spoke with patient for overview of: Xeloda (capecitabine) for the treatment of metastatic colon cancer, planned duration 2 months.   Counseled patient on administration, dosing, side effects, monitoring, drug-food interactions, safe handling, storage, and disposal.  Patient will take Xeloda 500mg  tablets, 4 tablets (2000mg ) by mouth in AM and 4 tabs (2000mg ) by mouth in PM, within 30 minutes of finishing meals, for 14 days on, 7 days off, repeated every 21 days.  Xeloda start date: 06/24/23  Adverse effects include but are not limited to: fatigue, decreased blood counts, GI upset, diarrhea, mouth sores, and hand-foot syndrome. Hand-foot syndrome: discussed use of cream such as Udderly Smooth Extra Care 20 or equivalent advanced care cream that has 20% urea content for advanced skin hydration while on Xeloda. Additionally discussed use of OTC Voltaren gel for HFS prophylaxis. Discussed recommended use of Voltaren gel is 1 finger tip application for front/backside of hands and then 1 fingertip application to bottoms of feet twice a day for up to 12 weeks.  Diarrhea: Patient will obtain Imodium (loperamide) to have on hand if they experience diarrhea. Patient knows to alert the office of 4 or more loose stools above baseline.   Reviewed with patient importance of keeping a medication schedule and plan for any missed doses. No barriers to medication adherence identified.  Medication reconciliation performed and medication/allergy list updated.  All questions answered.  Tammy Marks voiced understanding and appreciation.   Medication education handout placed in mail for patient. Patient knows to call the office with questions or concerns. Oral Chemotherapy Clinic phone number provided to patient.   Sherry Ruffing, PharmD, BCPS, BCOP Hematology/Oncology Clinical Pharmacist [facility] Oral Chemotherapy Navigation  Clinics [phone] 06/18/2023 2:13 PM

## 2023-06-18 NOTE — Telephone Encounter (Signed)
 Oral Chemotherapy Pharmacist Encounter   Attempted to reach patient to provide update and offer for initial counseling on oral medication: Xeloda (capecitabine).   No answer. Left voicemail for patient to call back to discuss details of medication acquisition and initial counseling session.   Per [facility], medication is scheduled to deliver to patient's home on 06/19/23.  Sherry Ruffing, PharmD, BCPS, BCOP Hematology/Oncology Clinical Pharmacist [facility] Oral Chemotherapy Navigation Clinics [phone] 06/18/2023 1:23 PM

## 2023-06-26 ENCOUNTER — Ambulatory Visit
Admission: RE | Admit: 2023-06-26 | Discharge: 2023-06-26 | Disposition: A | Discharge status: Home / Self Care | Payer: 59 | Source: Ambulatory Visit | Attending: Radiology | Admitting: Radiology

## 2023-06-26 DIAGNOSIS — C787 Secondary malignant neoplasm of liver and intrahepatic bile duct: Secondary | ICD-10-CM

## 2023-06-26 HISTORY — PX: IR RADIOLOGIST EVAL & MGMT: IMG5224

## 2023-06-26 NOTE — Progress Notes (Signed)
 Chief Complaint: Oligometastatic disease to liver, SP ablation of 1 lesion   Referring Physician(s): Feng,Yan   Early, Sung Amabile, NP as PCP - General (Nurse Practitioner) Malachy Mood, MD as Consulting Physician (Hematology and Oncology) Romie Levee, MD as Consulting Physician (General Surgery) Lynann Bologna, DO as Consulting Physician (Gastroenterology)   History of Present Illness: Tammy Marks is a 58 y.o. female presenting as scheduled follow up to VIR clinic today, SP CT guided ablation.  She has known oligometastatic disease of colorectal primary.    Tammy Marks joins [place] today by telemedicine visit.  We confirmed her identity with 2 identifiers.    History Her history per Dr. Latanya Maudlin note 11/06/22: 1. Left colon cancer and rectosigmoid adenocarcinoma, MMR normal  -Patient presented with mild hematochezia, and had a screening colonoscopy.  I discussed the findings with patient in detail. -She has 2 synchronized colorectal cancer.  The left descending colon cancer was only 4 mm, removed by polypectomy, but she likely still need a left hemicolectomy due to the invasive nature of her cancer. -The largest 3 cm, fungating mass in the rectosigmoid junction is 15 cm from anal verge on colonoscopy, biopsy confirmed moderate differentiated adenocarcinoma   She was referred for biopsy of a liver lesion identified on CT and MRI 11/01/22 and 11/18/22.  Liver mass Biopsy was done by me 12/03/22: FINAL MICROSCOPIC DIAGNOSIS:   A. LIVER, NEEDLE CORE BIOPSY:  -  Metastatic moderately differentiated adenocarcinoma morphologically  consistent with the clinical suspicion of colorectal metastasis, see  note.    Tammy Marks has completed 6 cycles of chemotherapy with Dr. Mosetta Putt, and has surgery scheduled with Dr. Maisie Fus in January.    PET CT was done 12/26/2022, showing 2 right liver lesions.   Correlate on CT 03/05/23    The second lesion on PET:    This lesion does not have a  correlate on CT 03/05/23, Does not have a correlate on the [place]  Does not have a correlate on the prior MRI       CEA 02/27/23 = 3.22, decreased from 6.01 and 27.14 of previous.    Interval History:   Tammy Marks underwent CT guided ablation with microwave of the deepest of 2 liver lesions, 05/22/23.  She was discharged on the same day.    Ablation 2/12 with previous correlate.    Tammy Marks says she is feeling fine with no new symptoms and no concerns at the site of our microwave probe placement.  She states "I can't really even tell you were there."  She is back to usual activities.    At the time of the ablation, we looked with [place] and CT to see if there was a correlate to the known PET avid lesion anteriorly - so that perhaps a fiducial marker could be placed - however, there was no obvious correlative lesion to target.  This lesion will need observation.    Past Medical History:  Diagnosis Date   Anemia    Cancer (HCC)    Colon cancer (HCC) 10/30/2022   surgery 05/03/23   Diabetes mellitus without complication [facility])    Personal history of chemotherapy 2024   colon ca    Past Surgical History:  Procedure Laterality Date   CESAREAN SECTION     CHOLECYSTECTOMY     IR IMAGING GUIDED PORT INSERTION  12/03/2022   IR RADIOLOGIST EVAL & MGMT  03/27/2023   IR US LIVER BIOPSY  12/03/2022   RADIOLOGY WITH  ANESTHESIA N/A 05/22/2023   Procedure: CT MICROWAVE ABLATION WITH ANESTHESIA;  Surgeon: Gilmer Mor, DO;  Location: [facility];  Service: Anesthesiology;  Laterality: N/A;    Allergies: Oxaliplatin  Medications: Prior to Admission medications   Medication Sig Start Date End Date Taking? Authorizing Provider  atorvastatin (LIPITOR) 20 MG tablet Take 20 mg by mouth daily.    [provider]  capecitabine (XELODA) 500 MG tablet Take 4 tablets (2000 mg total) by mouth 2 (two) times daily every 10-12 hours. Take within 30 minutes after meals. Take for 14 days on, then off for 7  days. Repeat every 21 days. 06/14/23   Malachy Mood, MD  Cholecalciferol (VITAMIN D3) 50 MCG (2000 UT) TABS Take 1,000 Units by mouth daily.    [provider]  gabapentin (NEURONTIN) 100 MG capsule Take 1 capsule po at bedtime. May increase to 2 capsules daily as needed and as tolerated. 04/22/23   Carlean Jews, NP  glipiZIDE (GLUCOTROL XL) 10 MG 24 hr tablet Take 10 mg by mouth daily.    [provider]  Iron, Ferrous Sulfate, 325 (65 Fe) MG TABS Take 325 mg by mouth daily. 02/13/19   Grayce Sessions, NP  lidocaine-prilocaine (EMLA) cream Apply to affected area once Patient not taking: Reported on 04/23/2023 12/20/22   Malachy Mood, MD  metFORMIN (GLUCOPHAGE-XR) 500 MG 24 hr tablet Take 1,000 mg by mouth 2 (two) times daily.    [provider]  ondansetron (ZOFRAN) 8 MG tablet Take 1 tablet (8 mg total) by mouth every 8 (eight) hours as needed for nausea or vomiting. Start on the third day after chemotherapy. Patient taking differently: Take 4 mg by mouth every 8 (eight) hours as needed for nausea or vomiting. Start on the third day after chemotherapy. 12/20/22   Malachy Mood, MD  polyethylene glycol powder (GLYCOLAX/MIRALAX) 17 GM/SCOOP powder Take 17 g by mouth daily as needed for mild constipation or moderate constipation. 03/21/23   [provider]  potassium chloride SA (KLOR-CON M) 20 MEQ tablet Take 1 tablet (20 mEq total) by mouth 2 (two) times daily. Take 1 tablet 3 times daily for 3 days, then twice daily Patient taking differently: Take 20 mEq by mouth daily. 01/29/23   Malachy Mood, MD  prochlorperazine (COMPAZINE) 10 MG tablet Take 1 tablet (10 mg total) by mouth every 6 (six) hours as needed for nausea or vomiting. 12/20/22   Malachy Mood, MD  traMADol (ULTRAM) 50 MG tablet Take 1-2 tablets (50-100 mg total) by mouth every 6 (six) hours as needed for moderate pain (pain score 4-6). 05/05/23   Romie Levee, MD     Family History  Problem Relation Age of Onset    Cancer Father 65       gastric cancer    Social History   Socioeconomic History   Marital status: Single    Spouse name: Not on file   Number of children: 2   Years of education: Not on file   Highest education level: Not on file  Occupational History   Not on file  Tobacco Use   Smoking status: Never   Smokeless tobacco: Never  Vaping Use   Vaping status: Never Used  Substance and Sexual Activity   Alcohol use: Not Currently   Drug use: Never   Sexual activity: Not Currently  Other Topics Concern   Not on file  Social History Narrative   Not on file   Social Drivers of Health  Financial Resource Strain: Not on file  Food Insecurity: No Food Insecurity (05/03/2023)   Hunger Vital Sign    Worried About Running Out of Food in the Last Year: Never true    Ran Out of Food in the Last Year: Never true  Transportation Needs: No Transportation Needs (05/03/2023)   PRAPARE - Administrator, Civil Service (Medical): No    Lack of Transportation (Non-Medical): No  Physical Activity: Not on file  Stress: Not on file  Social Connections: Moderately Isolated (05/03/2023)   Social Connection and Isolation Panel [NHANES]    Frequency of Communication with Friends and Family: More than three times a week    Frequency of Social Gatherings with Friends and Family: Once a week    Attends Religious Services: More than 4 times per year    Active Member of [employer] or Organizations: No    Attends Banker Meetings: Never    Marital Status: Never married       Review of Systems  Review of Systems: A 12 point ROS discussed and pertinent positives are indicated in the HPI above.  All other systems are negative.    Physical Exam No direct physical exam was performed (except for noted visual exam findings with Video Visits).    Vital Signs: LMP 06/20/2020 (Exact Date)   Imaging: No results found.  Labs:  CBC: Recent Labs    05/04/23 0500  05/05/23 0216 05/22/23 0700 06/17/23 1009  WBC 10.2 8.4 8.6 6.8  HGB 8.1* 8.0* 9.2* 9.4*  HCT 25.8* 25.7* 30.1* 29.8*  PLT 348 333 581* 392    COAGS: Recent Labs    12/03/22 0701 05/22/23 0700  INR 1.1 1.0    BMP: Recent Labs    05/04/23 0500 05/05/23 0216 05/22/23 0700 06/17/23 1009  NA 135 137 135 139  K 4.4 3.8 3.8 3.7  CL 99 102 97* 103  CO2 24 26 24 28   GLUCOSE 256* 210* 119* 117*  BUN 7 8 12 11   CALCIUM 8.0* 8.4* 8.6* 9.0  CREATININE 0.70 0.67 0.75 0.65  GFRNONAA >60 >60 >60 >60    LIVER FUNCTION TESTS: Recent Labs    03/28/23 1009 04/18/23 0905 05/22/23 0700 06/17/23 1009  BILITOT 0.4 0.4 1.0 0.3  AST 12* 13* 19 15  ALT 11 11 8 11   ALKPHOS 73 73 55 70  PROT 7.1 7.2 7.9 7.5  ALBUMIN 3.9 3.7 3.4* 3.8    TUMOR MARKERS: Recent Labs    02/27/23 1011 03/28/23 1009 04/18/23 0905 06/17/23 1009  CEA 3.22 2.04 1.64 4.25    Assessment and Plan:  Tammy Marks is 58 yo female with oligometastatic disease of colorectal primary, SP microwave ablation of right liver lesion 2/12.   She has recovered fully from the ablation.    I did discuss with Tammy Marks the strategy at this point, which is surveillance imaging.  Our first imaging is usually scheduled about 3 months post ablation.  So we will aim for the first imaging to be performed May of this year.  She understands.   She also has a second lesion which was PET avid at the anterior right liver, however, does not have any [place] or CT correlate.  This will need observation at this time.   Plan: - Contrast enhanced CT abdomen/pelvis in May, with office visit.  - CEA at next office visit.  - Continue current care   Electronically Signed: Gilmer Mor 06/26/2023, 3:49 PM   I  spent a total of    15 Minutes in remote  clinical consultation, greater than 50% of which was counseling/coordinating care for oligometastatic disease of the liver, SP ablation.    Visit type: Audio only (telephone). Audio (no  video) only due to patient's lack of internet/smartphone capability. Alternative for in-person consultation at [facility], [address], [place], [place]. This visit type was conducted due to national recommendations for restrictions regarding the COVID-19 Pandemic (e.g. social distancing).  This format is felt to be most appropriate for this patient at this time.  All issues noted in this document were discussed and addressed.

## 2023-06-30 NOTE — Assessment & Plan Note (Signed)
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  The left descending colon cancer was only 4 mm, removed by polypectomy. The largest 3 cm, fungating mass in the rectosigmoid junction is 15 cm from anal verge on colonoscopy, biopsy confirmed moderate differentiated adenocarcinoma -I reviewed her staging CT chest from November 18, 2022, which showed no definitive evidence of metastasis, but showed borderline enlarged thoracic adenopathy.  I will obtain a PET scan for further evaluation. -Her liver MRI unfortunately showed a 1.2 cm mass in the posterior dome of liver, segment 7, biopsy on 12/03/22 confirmed metastasis from colon cancer, I reviewed with her.  -Given her metastatic disease, I recommend chemotherapy first.  She started FOLFOX on 12/18/2022 and received 8 cycles before surgery -she underwent hemicolectomy on 05/03/23 and liver ablation on 05/22/23 by IR  -surgical path reviewed, ypT3N1b, surgical margins negative  -I recommend 2 more months adjuvant chemo FOLFOX.  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  She started Xeloda on June 17, 2023

## 2023-07-01 ENCOUNTER — Inpatient Hospital Stay

## 2023-07-01 ENCOUNTER — Encounter: Payer: Self-pay | Admitting: Hematology

## 2023-07-01 ENCOUNTER — Inpatient Hospital Stay (HOSPITAL_BASED_OUTPATIENT_CLINIC_OR_DEPARTMENT_OTHER): Admitting: Hematology

## 2023-07-01 VITALS — BP 138/85 | HR 86 | Temp 97.7°F | Resp 15 | Wt 267.7 lb

## 2023-07-01 DIAGNOSIS — D649 Anemia, unspecified: Secondary | ICD-10-CM

## 2023-07-01 DIAGNOSIS — Z95828 Presence of other vascular implants and grafts: Secondary | ICD-10-CM

## 2023-07-01 DIAGNOSIS — C186 Malignant neoplasm of descending colon: Secondary | ICD-10-CM

## 2023-07-01 DIAGNOSIS — K6389 Other specified diseases of intestine: Secondary | ICD-10-CM

## 2023-07-01 LAB — CMP (CANCER CENTER ONLY)
ALT: 15 U/L (ref 0–44)
AST: 16 U/L (ref 15–41)
Albumin: 4.1 g/dL (ref 3.5–5.0)
Alkaline Phosphatase: 77 U/L (ref 38–126)
Anion gap: 8 (ref 5–15)
BUN: 14 mg/dL (ref 6–20)
CO2: 27 mmol/L (ref 22–32)
Calcium: 9.8 mg/dL (ref 8.9–10.3)
Chloride: 100 mmol/L (ref 98–111)
Creatinine: 0.66 mg/dL (ref 0.44–1.00)
GFR, Estimated: >60 mL/min (ref >60)
Glucose, Bld: 121 mg/dL — ABNORMAL HIGH (ref 70–99)
Potassium: 4 mmol/L (ref 3.5–5.1)
Sodium: 135 mmol/L (ref 135–145)
Total Bilirubin: 0.3 mg/dL (ref 0.0–1.2)
Total Protein: 8.1 g/dL (ref 6.5–8.1)

## 2023-07-01 LAB — CBC WITH DIFFERENTIAL (CANCER CENTER ONLY)
Abs Immature Granulocytes: 0.02 10*3/uL (ref 0.00–0.07)
Basophils Absolute: 0 10*3/uL (ref 0.0–0.1)
Basophils Relative: 0 %
Eosinophils Absolute: 0.2 10*3/uL (ref 0.0–0.5)
Eosinophils Relative: 2 %
HCT: 31 % — ABNORMAL LOW (ref 36.0–46.0)
Hemoglobin: 9.7 g/dL — ABNORMAL LOW (ref 12.0–15.0)
Immature Granulocytes: 0 %
Lymphocytes Relative: 36 %
Lymphs Abs: 2.6 10*3/uL (ref 0.7–4.0)
MCH: 24.6 pg — ABNORMAL LOW (ref 26.0–34.0)
MCHC: 31.3 g/dL (ref 30.0–36.0)
MCV: 78.7 fL — ABNORMAL LOW (ref 80.0–100.0)
Monocytes Absolute: 0.6 10*3/uL (ref 0.1–1.0)
Monocytes Relative: 8 %
Neutro Abs: 3.8 10*3/uL (ref 1.7–7.7)
Neutrophils Relative %: 54 %
Platelet Count: 458 10*3/uL — ABNORMAL HIGH (ref 150–400)
RBC: 3.94 MIL/uL (ref 3.87–5.11)
RDW: 16.2 % — ABNORMAL HIGH (ref 11.5–15.5)
WBC Count: 7.2 10*3/uL (ref 4.0–10.5)
nRBC: 0 % (ref 0.0–0.2)

## 2023-07-01 MED ORDER — GABAPENTIN 100 MG PO CAPS: ORAL_CAPSULE | ORAL | 1 refills | Status: DC

## 2023-07-01 MED ORDER — HEPARIN SOD (PORK) LOCK FLUSH 100 UNIT/ML IV SOLN
500.0000 [IU] | Freq: Once | INTRAVENOUS | Status: AC
  Administered 2023-07-01: 500 [IU]

## 2023-07-01 MED ORDER — SODIUM CHLORIDE 0.9% FLUSH
10.0000 mL | Freq: Once | INTRAVENOUS | Status: AC
Start: 2023-07-01 — End: 2023-07-01
  Administered 2023-07-01: 10 mL

## 2023-07-01 NOTE — Progress Notes (Signed)
 [facility]   [phone] [phone]   Clinic Follow up Note   Patient Care Team: Early, Sung Amabile, NP as PCP - General (Nurse Practitioner) Malachy Mood, MD as Consulting Physician (Hematology and Oncology) Romie Levee, MD as Consulting Physician (General Surgery) Lynann Bologna, DO as Consulting Physician (Gastroenterology)  Date of Service:  07/01/2023  CHIEF COMPLAINT: f/u of colon cancer  CURRENT THERAPY:  Adjuvant Xeloda  Oncology History   Cancer of left colon (HCC) --cTxN0M1, stage IV with oligo liver metastasis, MMR normal, 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  The left descending colon cancer was only 4 mm, removed by polypectomy. The largest 3 cm, fungating mass in the rectosigmoid junction is 15 cm from anal verge on colonoscopy, biopsy confirmed moderate differentiated adenocarcinoma -I reviewed her staging CT chest from November 18, 2022, which showed no definitive evidence of metastasis, but showed borderline enlarged thoracic adenopathy.  I will obtain a PET scan for further evaluation. -Her liver MRI unfortunately showed a 1.2 cm mass in the posterior dome of liver, segment 7, biopsy on 12/03/22 confirmed metastasis from colon cancer, I reviewed with her.  -Given her metastatic disease, I recommend chemotherapy first.  She started FOLFOX on 12/18/2022 and received 8 cycles before surgery -she underwent hemicolectomy on 05/03/23 and liver ablation on 05/22/23 by IR  -surgical path reviewed, ypT3N1b, surgical margins negative  -I recommend 2 more months adjuvant chemo FOLFOX.  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  She started Xeloda on June 17, 2023    Assessment and Plan    Metastatic colon cancer Currently undergoing treatment with capecitabine for metastatic colon cancer. She is in the second week of her first cycle and has not  reported significant side effects such as hand-foot syndrome. Tolerating treatment well with no appetite or gastrointestinal issues. Post-surgical recovery is progressing well without complications. Plan to complete three cycles of capecitabine, followed by surveillance with CT scans every 3-6 months and Signatera blood tests for circulating tumor DNA every three months to monitor for recurrence. - Continue capecitabine as per current cycle schedule - Monitor for side effects such as hand-foot syndrome and gastrointestinal issues - Schedule follow-up visit on July 29, 2023 - Plan for CT scan every 3-6 months post-treatment - Consider Signatera blood test for circulating tumor DNA after completion of capecitabine treatment  Post-surgical recovery Recovering from recent surgery with a bikini cut incision. The incision is healing well without complications. - Monitor incision site for signs of infection or complications  Anemia Mild anemia with hemoglobin level of 9.7 g/dL, likely due to mild iron deficiency, indicated by small red blood cell size and slightly elevated platelet count. No reported bleeding. - Monitor blood counts and iron levels at next visit  Peripheral neuropathy Experiencing peripheral neuropathy with numbness and tingling in the fingers. Symptoms do not impair function. Previous gabapentin use was ineffective; a flexible dosing schedule is proposed to manage symptoms while minimizing drowsiness. - Prescribe gabapentin 100 mg with a flexible dosing schedule - Advise to adjust dosing based on drowsiness, with potential dosing of one tablet in the morning, one in the afternoon, and two to three in the evening - Provide one refill for gabapentin      Plan -She is tolerating Xeloda well, will continue this cycle 1 for one more week -Start cycle 2 Xeloda in 2 weeks -Lab and follow-up in 4  weeks before cycle 3 (last cycle ) Xeloda  -Will order CT scan on next visit and obtain  Signatera on next visit   SUMMARY OF ONCOLOGIC HISTORY: Oncology History  Cancer of left colon (HCC)  11/21/2022 Initial Diagnosis   Cancer of left colon (HCC)   12/18/2022 -  Chemotherapy   Patient is on Treatment Plan : COLORECTAL FOLFOX q14d     12/26/2022 PET scan   NM PET skull base to thigh  IMPRESSION: 1. Two hypermetabolic liver lesions consistent with metastatic disease. 2. Small hypermetabolic celiac axis and sigmoid mesocolon nodes suggesting metastatic adenopathy. 3. No findings for metastatic disease involving the chest or bony structures. 4. Diffuse hypermetabolism throughout the colon possibly related to recently eating or taking insulin making it difficult to identified the patient's sigmoid cancer.   05/03/2023 Cancer Staging   Staging form: Colon and Rectum, AJCC 8th Edition - Pathologic stage from 05/03/2023: Stage IVA (pT3, pN1b, pM1a) - Signed by Malachy Mood, MD on 06/04/2023 Histologic grading system: 4 grade system Histologic grade (G): G2 Residual tumor (R): R0      Discussed the use of AI scribe software for clinical note transcription with the patient, who gave verbal consent to proceed.  History of Present Illness   The patient, a 58 year old with metastatic colon cancer, presents for follow-up after starting oral chemotherapy with Xeloda. She is currently in the second week of her first cycle and reports no adverse effects. She has not noticed any peeling or cracking of the skin on her hands or feet, which are known side effects of Xeloda. Her appetite remains unaffected and she has not experienced any gastrointestinal issues.  The patient also reports neuropathy, describing a constant numbing, tingling sensation in her fingers. She denies any functional impairment or dropping of objects due to this sensation. She has previously tried gabapentin for this issue and is interested in restarting this medication.  In addition, the patient has a history of anemia,  which is currently mild with a hemoglobin level of 9.7. She denies any recent bleeding. She also has a slightly elevated platelet count and small red blood cell size, suggesting mild iron deficiency.         All other systems were reviewed with the patient and are negative.  MEDICAL HISTORY:  Past Medical History:  Diagnosis Date   Anemia    Cancer (HCC)    Colon cancer (HCC) 10/30/2022   surgery 05/03/23   Diabetes mellitus without complication [facility])    Personal history of chemotherapy 2024   colon ca    SURGICAL HISTORY: Past Surgical History:  Procedure Laterality Date   CESAREAN SECTION     CHOLECYSTECTOMY     IR IMAGING GUIDED PORT INSERTION  12/03/2022   IR RADIOLOGIST EVAL & MGMT  03/27/2023   IR RADIOLOGIST EVAL & MGMT  06/26/2023   IR US LIVER BIOPSY  12/03/2022   RADIOLOGY WITH ANESTHESIA N/A 05/22/2023   Procedure: CT MICROWAVE ABLATION WITH ANESTHESIA;  Surgeon: Gilmer Mor, DO;  Location: [facility];  Service: Anesthesiology;  Laterality: N/A;    I have reviewed the social history and family history with the patient and they are unchanged from previous note.  ALLERGIES:  is allergic to oxaliplatin.  MEDICATIONS:  Current Outpatient Medications  Medication Sig Dispense Refill   atorvastatin (LIPITOR) 20 MG tablet Take 20 mg by mouth daily.     capecitabine (XELODA) 500 MG tablet Take 4 tablets (2000 mg total) by mouth 2 (two) times  daily every 10-12 hours. Take within 30 minutes after meals. Take for 14 days on, then off for 7 days. Repeat every 21 days. 112 tablet 0   Cholecalciferol (VITAMIN D3) 50 MCG (2000 UT) TABS Take 1,000 Units by mouth daily.     gabapentin (NEURONTIN) 100 MG capsule Take 1 capsule po once or twice during daytime, and 2-3 capsules at bedtime 90 capsule 1   glipiZIDE (GLUCOTROL XL) 10 MG 24 hr tablet Take 10 mg by mouth daily.     Iron, Ferrous Sulfate, 325 (65 Fe) MG TABS Take 325 mg by mouth daily. 30 tablet 11   lidocaine-prilocaine (EMLA)  cream Apply to affected area once (Patient not taking: Reported on 04/23/2023) 30 g 3   metFORMIN (GLUCOPHAGE-XR) 500 MG 24 hr tablet Take 1,000 mg by mouth 2 (two) times daily.     ondansetron (ZOFRAN) 8 MG tablet Take 1 tablet (8 mg total) by mouth every 8 (eight) hours as needed for nausea or vomiting. Start on the third day after chemotherapy. (Patient taking differently: Take 4 mg by mouth every 8 (eight) hours as needed for nausea or vomiting. Start on the third day after chemotherapy.) 30 tablet 1   polyethylene glycol powder (GLYCOLAX/MIRALAX) 17 GM/SCOOP powder Take 17 g by mouth daily as needed for mild constipation or moderate constipation.     potassium chloride SA (KLOR-CON M) 20 MEQ tablet Take 1 tablet (20 mEq total) by mouth 2 (two) times daily. Take 1 tablet 3 times daily for 3 days, then twice daily (Patient taking differently: Take 20 mEq by mouth daily.) 60 tablet 1   prochlorperazine (COMPAZINE) 10 MG tablet Take 1 tablet (10 mg total) by mouth every 6 (six) hours as needed for nausea or vomiting. 30 tablet 1   traMADol (ULTRAM) 50 MG tablet Take 1-2 tablets (50-100 mg total) by mouth every 6 (six) hours as needed for moderate pain (pain score 4-6). 30 tablet 0   No current facility-administered medications for this visit.    PHYSICAL EXAMINATION: ECOG PERFORMANCE STATUS: 1 - Symptomatic but completely ambulatory  Vitals:   07/01/23 1308 07/01/23 1309  BP: (!) 151/82 138/85  Pulse: 86   Resp: 15   Temp: 97.7 F (36.5 C)   SpO2: 100%    Wt Readings from Last 3 Encounters:  07/01/23 267 lb 11.2 oz (121.4 kg)  05/22/23 260 lb (117.9 kg)  05/14/23 260 lb (117.9 kg)     GENERAL:alert, no distress and comfortable SKIN: skin color, texture, turgor are normal, no rashes or significant lesions EYES: normal, Conjunctiva are pink and non-injected, sclera clear NECK: supple, thyroid normal size, non-tender, without nodularity LYMPH:  no palpable lymphadenopathy in the  cervical, axillary  LUNGS: clear to auscultation and percussion with normal breathing effort HEART: regular rate & rhythm and no murmurs and no lower extremity edema ABDOMEN:abdomen soft, non-tender and normal bowel sounds Musculoskeletal:no cyanosis of digits and no clubbing  NEURO: alert & oriented x 3 with fluent speech, no focal motor/sensory deficits         LABORATORY DATA:  I have reviewed the data as listed    Latest Ref Rng & Units 07/01/2023   12:35 PM 06/17/2023   10:09 AM 05/22/2023    7:00 AM  CBC  WBC 4.0 - 10.5 K/uL 7.2  6.8  8.6   Hemoglobin 12.0 - 15.0 g/dL 9.7  9.4  9.2   Hematocrit 36.0 - 46.0 % 31.0  29.8  30.1   Platelets  150 - 400 K/uL 458  392  581         Latest Ref Rng & Units 07/01/2023   12:35 PM 06/17/2023   10:09 AM 05/22/2023    7:00 AM  CMP  Glucose 70 - 99 mg/dL 098  119  147   BUN 6 - 20 mg/dL 14  11  12    Creatinine 0.44 - 1.00 mg/dL 8.29  5.62  1.30   Sodium 135 - 145 mmol/L 135  139  135   Potassium 3.5 - 5.1 mmol/L 4.0  3.7  3.8   Chloride 98 - 111 mmol/L 100  103  97   CO2 22 - 32 mmol/L 27  28  24    Calcium 8.9 - 10.3 mg/dL 9.8  9.0  8.6   Total Protein 6.5 - 8.1 g/dL 8.1  7.5  7.9   Total Bilirubin 0.0 - 1.2 mg/dL 0.3  0.3  1.0   Alkaline Phos 38 - 126 U/L 77  70  55   AST 15 - 41 U/L 16  15  19    ALT 0 - 44 U/L 15  11  8        RADIOGRAPHIC STUDIES: I have personally reviewed the radiological images as listed and agreed with the findings in the report. No results found.    No orders of the defined types were placed in this encounter.  All questions were answered. The patient knows to call the clinic with any problems, questions or concerns. No barriers to learning was detected. The total time spent in the appointment was 25 minutes.     Malachy Mood, MD 07/01/2023

## 2023-07-02 ENCOUNTER — Other Ambulatory Visit: Payer: Self-pay

## 2023-07-02 DIAGNOSIS — C186 Malignant neoplasm of descending colon: Secondary | ICD-10-CM

## 2023-07-02 NOTE — Progress Notes (Signed)
 Per Dr. Georga Bora request, this CMA successfully ordered Signatera request on line and placed kit in the lab to be drawn by flush room on 4/21.

## 2023-07-03 ENCOUNTER — Other Ambulatory Visit: Payer: Self-pay | Admitting: Hematology

## 2023-07-03 DIAGNOSIS — C186 Malignant neoplasm of descending colon: Secondary | ICD-10-CM

## 2023-07-05 ENCOUNTER — Other Ambulatory Visit: Payer: Self-pay | Admitting: Hematology

## 2023-07-05 ENCOUNTER — Other Ambulatory Visit: Payer: Self-pay

## 2023-07-05 DIAGNOSIS — C186 Malignant neoplasm of descending colon: Secondary | ICD-10-CM

## 2023-07-09 ENCOUNTER — Telehealth: Payer: Self-pay | Admitting: *Deleted

## 2023-07-09 NOTE — Telephone Encounter (Addendum)
 On 07/05/2023: FMLA form completed by this nurse.  Provider unable to sign due to being out of office.  Form received today signed by provider.  Faythe Ghee called for status check.  Reported will be returned today to [employer] FMLA.  Requested copy be emailed to [email or web address].  Successfully returned via fax [phone]).  Copy to [facility] H.I.M bin for itenms to be scanned.  Emailed form to patient's work e-mail as requested in addition to mailing form to address on file.   [address] [place] [facility] [address] Process completed.  No further instructions received or actions performed by this nurse.Marland Kitchen

## 2023-07-16 ENCOUNTER — Encounter: Payer: Self-pay | Admitting: Hematology

## 2023-07-19 ENCOUNTER — Other Ambulatory Visit: Payer: Self-pay | Admitting: Hematology

## 2023-07-19 DIAGNOSIS — C186 Malignant neoplasm of descending colon: Secondary | ICD-10-CM

## 2023-07-28 NOTE — Assessment & Plan Note (Signed)
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  The left descending colon cancer was only 4 mm, removed by polypectomy. The largest 3 cm, fungating mass in the rectosigmoid junction is 15 cm from anal verge on colonoscopy, biopsy confirmed moderate differentiated adenocarcinoma -I reviewed her staging CT chest from November 18, 2022, which showed no definitive evidence of metastasis, but showed borderline enlarged thoracic adenopathy.  I will obtain a PET scan for further evaluation. -Her liver MRI unfortunately showed a 1.2 cm mass in the posterior dome of liver, segment 7, biopsy on 12/03/22 confirmed metastasis from colon cancer, I reviewed with her.  -Given her metastatic disease, I recommend chemotherapy first.  She started FOLFOX on 12/18/2022 and received 8 cycles before surgery -she underwent hemicolectomy on 05/03/23 and liver ablation on 05/22/23 by IR  -surgical path reviewed, ypT3N1b, surgical margins negative  -I recommend 2 more months adjuvant chemo FOLFOX.  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  She started Xeloda on June 17, 2023

## 2023-07-29 ENCOUNTER — Encounter: Payer: Self-pay | Admitting: Hematology

## 2023-07-29 ENCOUNTER — Inpatient Hospital Stay

## 2023-07-29 ENCOUNTER — Inpatient Hospital Stay: Attending: Hematology

## 2023-07-29 ENCOUNTER — Inpatient Hospital Stay (HOSPITAL_BASED_OUTPATIENT_CLINIC_OR_DEPARTMENT_OTHER): Admitting: Hematology

## 2023-07-29 VITALS — BP 124/86 | HR 109 | Temp 98.0°F | Resp 20 | Ht 65.0 in | Wt 270.9 lb

## 2023-07-29 DIAGNOSIS — D649 Anemia, unspecified: Secondary | ICD-10-CM

## 2023-07-29 DIAGNOSIS — Z5189 Encounter for other specified aftercare: Secondary | ICD-10-CM | POA: Insufficient documentation

## 2023-07-29 DIAGNOSIS — C787 Secondary malignant neoplasm of liver and intrahepatic bile duct: Secondary | ICD-10-CM | POA: Insufficient documentation

## 2023-07-29 DIAGNOSIS — Z95828 Presence of other vascular implants and grafts: Secondary | ICD-10-CM

## 2023-07-29 DIAGNOSIS — K6389 Other specified diseases of intestine: Secondary | ICD-10-CM

## 2023-07-29 DIAGNOSIS — C186 Malignant neoplasm of descending colon: Secondary | ICD-10-CM

## 2023-07-29 LAB — CBC WITH DIFFERENTIAL (CANCER CENTER ONLY)
Abs Immature Granulocytes: 0.02 10*3/uL (ref 0.00–0.07)
Basophils Absolute: 0.1 10*3/uL (ref 0.0–0.1)
Basophils Relative: 1 %
Eosinophils Absolute: 0.1 10*3/uL (ref 0.0–0.5)
Eosinophils Relative: 1 %
HCT: 31 % — ABNORMAL LOW (ref 36.0–46.0)
Hemoglobin: 10.1 g/dL — ABNORMAL LOW (ref 12.0–15.0)
Immature Granulocytes: 0 %
Lymphocytes Relative: 37 %
Lymphs Abs: 3.1 10*3/uL (ref 0.7–4.0)
MCH: 24.7 pg — ABNORMAL LOW (ref 26.0–34.0)
MCHC: 32.6 g/dL (ref 30.0–36.0)
MCV: 75.8 fL — ABNORMAL LOW (ref 80.0–100.0)
Monocytes Absolute: 0.6 10*3/uL (ref 0.1–1.0)
Monocytes Relative: 7 %
Neutro Abs: 4.5 10*3/uL (ref 1.7–7.7)
Neutrophils Relative %: 54 %
Platelet Count: 402 10*3/uL — ABNORMAL HIGH (ref 150–400)
RBC: 4.09 MIL/uL (ref 3.87–5.11)
RDW: 18.9 % — ABNORMAL HIGH (ref 11.5–15.5)
WBC Count: 8.3 10*3/uL (ref 4.0–10.5)
nRBC: 0 % (ref 0.0–0.2)

## 2023-07-29 LAB — CMP (CANCER CENTER ONLY)
ALT: 18 U/L (ref 0–44)
AST: 16 U/L (ref 15–41)
Albumin: 4.1 g/dL (ref 3.5–5.0)
Alkaline Phosphatase: 81 U/L (ref 38–126)
Anion gap: 8 (ref 5–15)
BUN: 12 mg/dL (ref 6–20)
CO2: 28 mmol/L (ref 22–32)
Calcium: 9.9 mg/dL (ref 8.9–10.3)
Chloride: 101 mmol/L (ref 98–111)
Creatinine: 0.66 mg/dL (ref 0.44–1.00)
GFR, Estimated: >60 mL/min (ref >60)
Glucose, Bld: 109 mg/dL — ABNORMAL HIGH (ref 70–99)
Potassium: 3.9 mmol/L (ref 3.5–5.1)
Sodium: 137 mmol/L (ref 135–145)
Total Bilirubin: 0.3 mg/dL (ref 0.0–1.2)
Total Protein: 8 g/dL (ref 6.5–8.1)

## 2023-07-29 LAB — GENETIC SCREENING ORDER

## 2023-07-29 MED ORDER — ALTEPLASE 2 MG IJ SOLR
2.0000 mg | Freq: Once | INTRAMUSCULAR | Status: AC
  Administered 2023-07-29: 2 mg
  Filled 2023-07-29: qty 2

## 2023-07-29 NOTE — Progress Notes (Signed)
 [facility]   Telephone:(336) [phone] Fax:(336) [phone]   Clinic Follow up Note   Patient Care Team: Early, Adriane Albe, NP as PCP - General (Nurse Practitioner) Sonja Poole, MD as Consulting Physician (Hematology and Oncology) Joyce Nixon, MD as Consulting Physician (General Surgery) Renaye Carp, DO as Consulting Physician (Gastroenterology)  Date of Service:  07/29/2023  CHIEF COMPLAINT: f/u of colon cancer  CURRENT THERAPY:  Adjuvant Xeloda   Oncology History   Cancer of left colon (HCC) --cTxN0M1, stage IV with oligo liver metastasis, MMR normal, 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  The left descending colon cancer was only 4 mm, removed by polypectomy. The largest 3 cm, fungating mass in the rectosigmoid junction is 15 cm from anal verge on colonoscopy, biopsy confirmed moderate differentiated adenocarcinoma -I reviewed her staging CT chest from November 18, 2022, which showed no definitive evidence of metastasis, but showed borderline enlarged thoracic adenopathy.  I will obtain a PET scan for further evaluation. -Her liver MRI unfortunately showed a 1.2 cm mass in the posterior dome of liver, segment 7, biopsy on 12/03/22 confirmed metastasis from colon cancer, I reviewed with her.  -Given her metastatic disease, I recommend chemotherapy first.  She started FOLFOX on 12/18/2022 and received 8 cycles before surgery -she underwent hemicolectomy on 05/03/23 and liver ablation on 05/22/23 by IR  -surgical path reviewed, ypT3N1b, surgical margins negative  -I recommend 2 more months adjuvant chemo FOLFOX.  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  She started Xeloda  on June 17, 2023   Assessment & Plan Metastatic colon cancer Metastatic colon cancer, currently undergoing oral chemotherapy post-surgery. She has completed two cycles without significant side effects, such  as skin peeling, mucositis, abdominal pain, or diarrhea. Notable skin hyperpigmentation, a common side effect, is present. The last chemotherapy dose was administered yesterday. One additional cycle of chemotherapy is planned. Post-chemotherapy surveillance will include biannual CT scans and quarterly circulating tumor DNA tests. Routine hematologic and renal function assessments are necessary. Port complications require medication to facilitate blood return. - Complete one more cycle of oral chemotherapy. - Order CT scan in early June, approximately six weeks from today. - Conduct circulating tumor DNA test every three months. - Perform routine blood counts and kidney function tests regularly. - Schedule port flush every six to eight weeks. - Monitor for symptoms such as abdominal bloating, pain, nausea, abnormal bowel movements, and bleeding. - Advise on recovery time post-chemotherapy, including potential fatigue and skin color changes.  Plan - She is clinically doing well, will start the last cycle of Xeloda  next Monday, April 28 -She had lab drawn for Signatera, we will call her with results. - Follow-up in 6 weeks with lab and CT scan 1 week before   SUMMARY OF ONCOLOGIC HISTORY: Oncology History  Cancer of left colon (HCC)  11/21/2022 Initial Diagnosis   Cancer of left colon (HCC)   12/18/2022 -  Chemotherapy   Patient is on Treatment Plan : COLORECTAL FOLFOX q14d     12/26/2022 PET scan   NM PET skull base to thigh  IMPRESSION: 1. Two hypermetabolic liver lesions consistent with metastatic disease. 2. Small hypermetabolic celiac axis and sigmoid mesocolon nodes suggesting metastatic adenopathy. 3. No findings for metastatic disease involving the chest or bony structures. 4. Diffuse hypermetabolism throughout the colon possibly related to recently eating or taking insulin  making it difficult to identified the patient's sigmoid cancer.  05/03/2023 Cancer Staging   Staging form:  Colon and Rectum, AJCC 8th Edition - Pathologic stage from 05/03/2023: Stage IVA (pT3, pN1b, pM1a) - Signed by Sonja Covington, MD on 06/04/2023 Histologic grading system: 4 grade system Histologic grade (G): G2 Residual tumor (R): R0      Discussed the use of AI scribe software for clinical note transcription with the patient, who gave verbal consent to proceed.  History of Present Illness The patient, a 58 year old with metastatic colon cancer, presents for a routine follow-up. She reports no new symptoms or issues since her last visit. She has been taking oral chemotherapy medication, which she tolerates well with no reported side effects such as skin peeling, mouth sores, stomach pain, or diarrhea. She notes that her skin has darkened slightly, which is a common side effect of the medication. She took her last dose of the medication yesterday and is due for a week off. She has completed two cycles of this medication since her surgery. She has not yet refilled her prescription for the next cycle. She also has a port for medication administration, which has recently been having issues with blood return.     All other systems were reviewed with the patient and are negative.  MEDICAL HISTORY:  Past Medical History:  Diagnosis Date   Anemia    Cancer (HCC)    Colon cancer (HCC) 10/30/2022   surgery 05/03/23   Diabetes mellitus without complication [facility])    Personal history of chemotherapy 2024   colon ca    SURGICAL HISTORY: Past Surgical History:  Procedure Laterality Date   CESAREAN SECTION     CHOLECYSTECTOMY     IR IMAGING GUIDED PORT INSERTION  12/03/2022   IR RADIOLOGIST EVAL & MGMT  03/27/2023   IR RADIOLOGIST EVAL & MGMT  06/26/2023   IR US  LIVER BIOPSY  12/03/2022   RADIOLOGY WITH ANESTHESIA N/A 05/22/2023   Procedure: CT MICROWAVE ABLATION WITH ANESTHESIA;  Surgeon: Myrlene Asper, DO;  Location: [facility];  Service: Anesthesiology;  Laterality: N/A;    I have reviewed the social  history and family history with the patient and they are unchanged from previous note.  ALLERGIES:  is allergic to oxaliplatin .  MEDICATIONS:  Current Outpatient Medications  Medication Sig Dispense Refill   atorvastatin  (LIPITOR) 20 MG tablet Take 20 mg by mouth daily.     capecitabine  (XELODA ) 500 MG tablet TAKE 4 TABLETS BY MOUTH TWICE  DAILY (EVERY 10 TO 12 HOURS)  TAKE WITHIN 30 MINUTES AFTER  MEALS FOR 14 DAYS ON, THEN 7  DAYS OFF. REPEAT EVERY 21 DAYS 112 tablet 0   gabapentin  (NEURONTIN ) 100 MG capsule Take 1 capsule po once or twice during daytime, and 2-3 capsules at bedtime 90 capsule 1   glipiZIDE  (GLUCOTROL  XL) 10 MG 24 hr tablet Take 10 mg by mouth daily.     Iron , Ferrous Sulfate , 325 (65 Fe) MG TABS Take 325 mg by mouth daily. 30 tablet 11   metFORMIN  (GLUCOPHAGE -XR) 500 MG 24 hr tablet Take 1,000 mg by mouth 2 (two) times daily.     potassium chloride  SA (KLOR-CON  M) 20 MEQ tablet Take 1 tablet (20 mEq total) by mouth 2 (two) times daily. Take 1 tablet 3 times daily for 3 days, then twice daily (Patient taking differently: Take 20 mEq by mouth daily.) 60 tablet 1   Cholecalciferol (VITAMIN D3) 50 MCG (2000 UT) TABS Take 1,000 Units by mouth daily.     lidocaine -prilocaine  (EMLA ) cream Apply  to affected area once (Patient not taking: Reported on 04/23/2023) 30 g 3   ondansetron  (ZOFRAN ) 8 MG tablet Take 1 tablet (8 mg total) by mouth every 8 (eight) hours as needed for nausea or vomiting. Start on the third day after chemotherapy. (Patient taking differently: Take 4 mg by mouth every 8 (eight) hours as needed for nausea or vomiting. Start on the third day after chemotherapy.) 30 tablet 1   polyethylene glycol powder (GLYCOLAX /MIRALAX ) 17 GM/SCOOP powder Take 17 g by mouth daily as needed for mild constipation or moderate constipation.     prochlorperazine  (COMPAZINE ) 10 MG tablet Take 1 tablet (10 mg total) by mouth every 6 (six) hours as needed for nausea or vomiting. 30 tablet 1    traMADol  (ULTRAM ) 50 MG tablet Take 1-2 tablets (50-100 mg total) by mouth every 6 (six) hours as needed for moderate pain (pain score 4-6). 30 tablet 0   No current facility-administered medications for this visit.    PHYSICAL EXAMINATION: ECOG PERFORMANCE STATUS: 1 - Symptomatic but completely ambulatory  Vitals:   07/29/23 1344  BP: 124/86  Pulse: (!) 109  Resp: 20  Temp: 98 F (36.7 C)  SpO2: 99%   Wt Readings from Last 3 Encounters:  07/29/23 270 lb 14.4 oz (122.9 kg)  07/01/23 267 lb 11.2 oz (121.4 kg)  05/22/23 260 lb (117.9 kg)     GENERAL:alert, no distress and comfortable SKIN: skin color, texture, turgor are normal, no rashes or significant lesions EYES: normal, Conjunctiva are pink and non-injected, sclera clear NECK: supple, thyroid normal size, non-tender, without nodularity LYMPH:  no palpable lymphadenopathy in the cervical, axillary  LUNGS: clear to auscultation and percussion with normal breathing effort HEART: regular rate & rhythm and no murmurs and no lower extremity edema ABDOMEN:abdomen soft, non-tender and normal bowel sounds Musculoskeletal:no cyanosis of digits and no clubbing  NEURO: alert & oriented x 3 with fluent speech, no focal motor/sensory deficits  Physical Exam    LABORATORY DATA:  I have reviewed the data as listed    Latest Ref Rng & Units 07/01/2023   12:35 PM 06/17/2023   10:09 AM 05/22/2023    7:00 AM  CBC  WBC 4.0 - 10.5 K/uL 7.2  6.8  8.6   Hemoglobin 12.0 - 15.0 g/dL 9.7  9.4  9.2   Hematocrit 36.0 - 46.0 % 31.0  29.8  30.1   Platelets 150 - 400 K/uL 458  392  581         Latest Ref Rng & Units 07/01/2023   12:35 PM 06/17/2023   10:09 AM 05/22/2023    7:00 AM  CMP  Glucose 70 - 99 mg/dL 956  213  086   BUN 6 - 20 mg/dL 14  11  12    Creatinine 0.44 - 1.00 mg/dL 5.78  4.69  6.29   Sodium 135 - 145 mmol/L 135  139  135   Potassium 3.5 - 5.1 mmol/L 4.0  3.7  3.8   Chloride 98 - 111 mmol/L 100  103  97   CO2 22 - 32  mmol/L 27  28  24    Calcium  8.9 - 10.3 mg/dL 9.8  9.0  8.6   Total Protein 6.5 - 8.1 g/dL 8.1  7.5  7.9   Total Bilirubin 0.0 - 1.2 mg/dL 0.3  0.3  1.0   Alkaline Phos 38 - 126 U/L 77  70  55   AST 15 - 41 U/L 16  15  19   ALT 0 - 44 U/L 15  11  8        RADIOGRAPHIC STUDIES: I have personally reviewed the radiological images as listed and agreed with the findings in the report. No results found.    Orders Placed This Encounter  Procedures   CT CHEST ABDOMEN PELVIS W CONTRAST    Standing Status:   Future    Expected Date:   09/02/2023    Expiration Date:   07/28/2024    If indicated for the ordered procedure, I authorize the administration of contrast media per Radiology protocol:   Yes    Does the patient have a contrast media/X-ray dye allergy?:   No    Is patient pregnant?:   No    Preferred imaging location?:   [facility]    If indicated for the ordered procedure, I authorize the administration of oral contrast media per Radiology protocol:   Yes   All questions were answered. The patient knows to call the clinic with any problems, questions or concerns. No barriers to learning was detected. The total time spent in the appointment was 25 minutes.     Sonja Ruth, MD 07/29/2023

## 2023-07-30 ENCOUNTER — Other Ambulatory Visit: Payer: Self-pay

## 2023-07-30 LAB — FERRITIN: Ferritin: 135 ng/mL (ref 11–307)

## 2023-07-30 LAB — CEA (ACCESS): CEA (CHCC): 1.81 ng/mL (ref 0.00–5.00)

## 2023-08-13 LAB — SIGNATERA
SIGNATERA MTM READOUT: 0.85 MTM/ml — AB
SIGNATERA TEST RESULT: POSITIVE — AB

## 2023-08-14 ENCOUNTER — Encounter (HOSPITAL_COMMUNITY): Payer: Self-pay

## 2023-08-16 ENCOUNTER — Telehealth: Payer: Self-pay | Admitting: Hematology

## 2023-08-17 ENCOUNTER — Other Ambulatory Visit: Payer: Self-pay | Admitting: Hematology

## 2023-08-17 DIAGNOSIS — C186 Malignant neoplasm of descending colon: Secondary | ICD-10-CM

## 2023-08-28 ENCOUNTER — Other Ambulatory Visit: Payer: Self-pay | Admitting: Hematology

## 2023-08-28 DIAGNOSIS — C186 Malignant neoplasm of descending colon: Secondary | ICD-10-CM

## 2023-08-28 NOTE — Telephone Encounter (Signed)
 Pt started last dose of Capecitabine  on 08/05/2023 per Dr. Candise Chambers last office note.

## 2023-08-30 ENCOUNTER — Other Ambulatory Visit: Payer: Self-pay

## 2023-08-30 ENCOUNTER — Encounter: Payer: Self-pay | Admitting: Hematology

## 2023-08-30 ENCOUNTER — Other Ambulatory Visit

## 2023-09-03 ENCOUNTER — Ambulatory Visit (HOSPITAL_COMMUNITY)
Admission: RE | Admit: 2023-09-03 | Discharge: 2023-09-03 | Disposition: A | Discharge status: Home / Self Care | Source: Ambulatory Visit | Attending: Hematology | Admitting: Hematology

## 2023-09-03 ENCOUNTER — Inpatient Hospital Stay: Attending: Hematology

## 2023-09-03 ENCOUNTER — Telehealth: Payer: Self-pay

## 2023-09-03 ENCOUNTER — Other Ambulatory Visit: Payer: Self-pay

## 2023-09-03 ENCOUNTER — Other Ambulatory Visit: Payer: Self-pay | Admitting: Hematology

## 2023-09-03 DIAGNOSIS — C186 Malignant neoplasm of descending colon: Secondary | ICD-10-CM | POA: Insufficient documentation

## 2023-09-03 DIAGNOSIS — D649 Anemia, unspecified: Secondary | ICD-10-CM

## 2023-09-03 DIAGNOSIS — D500 Iron deficiency anemia secondary to blood loss (chronic): Secondary | ICD-10-CM

## 2023-09-03 DIAGNOSIS — K6389 Other specified diseases of intestine: Secondary | ICD-10-CM

## 2023-09-03 DIAGNOSIS — Z95828 Presence of other vascular implants and grafts: Secondary | ICD-10-CM

## 2023-09-03 LAB — CMP (CANCER CENTER ONLY)
ALT: 13 U/L (ref 0–44)
AST: 15 U/L (ref 15–41)
Albumin: 4 g/dL (ref 3.5–5.0)
Alkaline Phosphatase: 82 U/L (ref 38–126)
Anion gap: 9 (ref 5–15)
BUN: 16 mg/dL (ref 6–20)
CO2: 28 mmol/L (ref 22–32)
Calcium: 9.6 mg/dL (ref 8.9–10.3)
Chloride: 102 mmol/L (ref 98–111)
Creatinine: 0.7 mg/dL (ref 0.44–1.00)
GFR, Estimated: >60 mL/min (ref >60)
Glucose, Bld: 135 mg/dL — ABNORMAL HIGH (ref 70–99)
Potassium: 4 mmol/L (ref 3.5–5.1)
Sodium: 139 mmol/L (ref 135–145)
Total Bilirubin: 0.3 mg/dL (ref 0.0–1.2)
Total Protein: 7.7 g/dL (ref 6.5–8.1)

## 2023-09-03 LAB — CBC WITH DIFFERENTIAL (CANCER CENTER ONLY)
Abs Immature Granulocytes: 0.03 10*3/uL (ref 0.00–0.07)
Basophils Absolute: 0 10*3/uL (ref 0.0–0.1)
Basophils Relative: 1 %
Eosinophils Absolute: 0.1 10*3/uL (ref 0.0–0.5)
Eosinophils Relative: 2 %
HCT: 30.3 % — ABNORMAL LOW (ref 36.0–46.0)
Hemoglobin: 9.8 g/dL — ABNORMAL LOW (ref 12.0–15.0)
Immature Granulocytes: 0 %
Lymphocytes Relative: 41 %
Lymphs Abs: 2.9 10*3/uL (ref 0.7–4.0)
MCH: 25.1 pg — ABNORMAL LOW (ref 26.0–34.0)
MCHC: 32.3 g/dL (ref 30.0–36.0)
MCV: 77.7 fL — ABNORMAL LOW (ref 80.0–100.0)
Monocytes Absolute: 0.6 10*3/uL (ref 0.1–1.0)
Monocytes Relative: 9 %
Neutro Abs: 3.3 10*3/uL (ref 1.7–7.7)
Neutrophils Relative %: 47 %
Platelet Count: 349 10*3/uL (ref 150–400)
RBC: 3.9 MIL/uL (ref 3.87–5.11)
RDW: 21.6 % — ABNORMAL HIGH (ref 11.5–15.5)
WBC Count: 7 10*3/uL (ref 4.0–10.5)
nRBC: 0 % (ref 0.0–0.2)

## 2023-09-03 LAB — FERRITIN: Ferritin: 98 ng/mL (ref 11–307)

## 2023-09-03 LAB — CEA (ACCESS): CEA (CHCC): 2.21 ng/mL (ref 0.00–5.00)

## 2023-09-03 MED ORDER — IOHEXOL 9 MG/ML PO SOLN
ORAL | Status: AC
Start: 2023-09-03 — End: ?
  Filled 2023-09-03: qty 1000

## 2023-09-03 MED ORDER — HEPARIN SOD (PORK) LOCK FLUSH 100 UNIT/ML IV SOLN
500.0000 [IU] | Freq: Once | INTRAVENOUS | Status: AC
  Administered 2023-09-03: 500 [IU] via INTRAVENOUS

## 2023-09-03 MED ORDER — IOHEXOL 9 MG/ML PO SOLN
500.0000 mL | ORAL | Status: AC
  Administered 2023-09-03 (×2): 500 mL via ORAL

## 2023-09-03 MED ORDER — HEPARIN SOD (PORK) LOCK FLUSH 100 UNIT/ML IV SOLN
INTRAVENOUS | Status: AC
  Filled 2023-09-03: qty 5

## 2023-09-03 MED ORDER — IOHEXOL 300 MG/ML  SOLN
100.0000 mL | Freq: Once | INTRAMUSCULAR | Status: AC | PRN
  Administered 2023-09-03: 100 mL via INTRAVENOUS

## 2023-09-03 MED ORDER — HEPARIN SOD (PORK) LOCK FLUSH 100 UNIT/ML IV SOLN: 500.0000 [IU] | Freq: Once | INTRAVENOUS | Status: DC

## 2023-09-03 MED ORDER — SODIUM CHLORIDE 0.9% FLUSH
3.0000 mL | Freq: Once | INTRAVENOUS | Status: AC | PRN
  Administered 2023-09-03: 3 mL

## 2023-09-03 NOTE — Telephone Encounter (Signed)
 Contacted [facility] to cancel capecitabine , per Dr. Candise Chambers request.  [facility] Pharmacist canceled on their end.

## 2023-09-04 ENCOUNTER — Other Ambulatory Visit: Payer: Self-pay

## 2023-09-04 ENCOUNTER — Encounter: Payer: Self-pay | Admitting: Hematology

## 2023-09-05 ENCOUNTER — Inpatient Hospital Stay: Admitting: Hematology

## 2023-09-06 ENCOUNTER — Inpatient Hospital Stay: Admitting: Hematology

## 2023-09-06 ENCOUNTER — Encounter: Payer: Self-pay | Admitting: Hematology

## 2023-09-06 DIAGNOSIS — C186 Malignant neoplasm of descending colon: Secondary | ICD-10-CM

## 2023-09-06 NOTE — Progress Notes (Signed)
 [facility]   Telephone:(336) [phone] Fax:(336) [phone]   Clinic Follow up Note   Patient Care Team: Early, Adriane Albe, NP as PCP - General (Nurse Practitioner) Sonja Higgston, MD as Consulting Physician (Hematology and Oncology) Joyce Nixon, MD as Consulting Physician (General Surgery) Renaye Carp, DO as Consulting Physician (Gastroenterology) 09/06/2023  I connected with Tammy Marks on 09/06/23 at  8:00 AM EDT by telephone and verified that I am speaking with the correct person using two identifiers.   I discussed the limitations, risks, security and privacy concerns of performing an evaluation and management service by telephone and the availability of in person appointments. I also discussed with the patient that there may be a patient responsible charge related to this service. The patient expressed understanding and agreed to proceed.   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  The left descending colon cancer was only 4 mm, removed by polypectomy. The largest 3 cm, fungating mass in the rectosigmoid junction is 15 cm from anal verge on colonoscopy, biopsy confirmed moderate differentiated adenocarcinoma -I reviewed her staging CT chest from November 18, 2022, which showed no definitive evidence of metastasis, but showed borderline enlarged thoracic adenopathy.  I will obtain a PET scan for further evaluation. -Her liver MRI unfortunately showed a 1.2 cm mass in the posterior dome of liver, segment 7, biopsy on 12/03/22 confirmed metastasis from colon cancer, I reviewed with her.  -Given her metastatic disease, I recommend chemotherapy first.  She started FOLFOX on 12/18/2022 and received 8 cycles  before surgery -she underwent hemicolectomy on 05/03/23 and liver ablation on 05/22/23 by IR  -surgical path reviewed, ypT3N1b, surgical margins negative  -I recommend 2 more months adjuvant chemo FOLFOX.  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  She started Xeloda  on June 17, 2023 and completed in early May 2025 -Signatera was positive on 08/13/2023 and CT 09/03/2023 showed enlarged liver met. Will obtain PET -will refer her to surgery for liver resection    Assessment & Plan Metastatic colon cancer with liver involvement Metastatic colon cancer with liver involvement shows progression, as indicated by an increase in liver lesion size on recent CT scan and a positive Signatera blood test, suggesting residual cancer. Differential diagnosis includes cancer recurrence versus post-ablation tissue changes. She experiences mild, intermittent right lower abdominal pain, likely due to surgical scar tissue. A PET scan is planned to evaluate liver lesion activity and rule out further metastasis. If isolated liver involvement is confirmed, surgical resection by a specialized liver surgeon is preferred over chemotherapy, given the tumor size and recent chemotherapy completion. She is informed of the high recurrence risk post-surgery, with plans for close monitoring using Signatera and imaging every 3 to 6 months. - Order PET scan to evaluate liver lesion and check for additional metastatic sites. - Refer to Dr. Leighton Punches or Dr. Cherlynn Cornfield for surgical evaluation if PET scan shows isolated liver involvement. - Monitor with Signatera and repeat imaging every 3 to 6 months. - Defer chemotherapy pending surgical evaluation.  Plan - I discussed her Signatera and CT scan results, which is high risk for worsening liver metastasis - PET scan in 1  to 2 weeks - Referral to Dr. Leighton Punches or Dr. Cherlynn Cornfield for liver surgery - Follow-up in a month   SUMMARY OF  ONCOLOGIC HISTORY: Oncology History  Cancer of left colon (HCC)  11/21/2022 Initial Diagnosis   Cancer of left colon (HCC)   12/18/2022 -  Chemotherapy   Patient is on Treatment Plan : COLORECTAL FOLFOX q14d     12/26/2022 PET scan   NM PET skull base to thigh  IMPRESSION: 1. Two hypermetabolic liver lesions consistent with metastatic disease. 2. Small hypermetabolic celiac axis and sigmoid mesocolon nodes suggesting metastatic adenopathy. 3. No findings for metastatic disease involving the chest or bony structures. 4. Diffuse hypermetabolism throughout the colon possibly related to recently eating or taking insulin  making it difficult to identified the patient's sigmoid cancer.   05/03/2023 Cancer Staging   Staging form: Colon and Rectum, AJCC 8th Edition - Pathologic stage from 05/03/2023: Stage IVA (pT3, pN1b, pM1a) - Signed by Sonja [place], MD on 06/04/2023 Histologic grading system: 4 grade system Histologic grade (G): G2 Residual tumor (R): R0     Discussed the use of AI scribe software for clinical note transcription with the patient, who gave verbal consent to proceed.  History of Present Illness Tammy Marks is a 58 year old female with metastatic colon cancer who presents for a phone visit to review a CT scan.  A recent CT scan reveals an enlarged liver lesion. She has a history of liver cancer treated with ablation. A Signatera blood test is positive for circulating tumor DNA. She completed oral chemotherapy earlier this month.  She experiences mild, non-constant pain in the lower right abdomen, noticeable when rising from bed, and does not use pain medication. She feels fine overall with no new symptoms since her last visit three to four weeks ago.     REVIEW OF SYSTEMS:   Constitutional: Denies fevers, chills or abnormal weight loss Eyes: Denies blurriness of vision Ears, nose, mouth, throat, and face: Denies mucositis or sore throat Respiratory: Denies cough,  dyspnea or wheezes Cardiovascular: Denies palpitation, chest discomfort or lower extremity swelling Gastrointestinal:  Denies nausea, heartburn or change in bowel habits Skin: Denies abnormal skin rashes Lymphatics: Denies new lymphadenopathy or easy bruising Neurological:Denies numbness, tingling or new weaknesses Behavioral/Psych: Mood is stable, no new changes  All other systems were reviewed with the patient and are negative.  MEDICAL HISTORY:  Past Medical History:  Diagnosis Date   Anemia    Cancer (HCC)    Colon cancer (HCC) 10/30/2022   surgery 05/03/23   Diabetes mellitus without complication [facility])    Personal history of chemotherapy 2024   colon ca    SURGICAL HISTORY: Past Surgical History:  Procedure Laterality Date   CESAREAN SECTION     CHOLECYSTECTOMY     IR IMAGING GUIDED PORT INSERTION  12/03/2022   IR RADIOLOGIST EVAL & MGMT  03/27/2023   IR RADIOLOGIST EVAL & MGMT  06/26/2023   IR US  LIVER BIOPSY  12/03/2022   RADIOLOGY WITH ANESTHESIA N/A 05/22/2023   Procedure: CT MICROWAVE ABLATION WITH ANESTHESIA;  Surgeon: Myrlene Asper, DO;  Location: [facility];  Service: Anesthesiology;  Laterality: N/A;    I have reviewed the social history and family history with the patient and they are unchanged from previous note.  ALLERGIES:  is allergic to oxaliplatin .  MEDICATIONS:  Current Outpatient Medications  Medication Sig Dispense Refill   atorvastatin  (LIPITOR) 20 MG tablet Take 20 mg by mouth daily.  gabapentin  (NEURONTIN ) 100 MG capsule Take 1 capsule po once or twice during daytime, and 2-3 capsules at bedtime 90 capsule 1   glipiZIDE  (GLUCOTROL  XL) 10 MG 24 hr tablet Take 10 mg by mouth daily.     Iron , Ferrous Sulfate , 325 (65 Fe) MG TABS Take 325 mg by mouth daily. 30 tablet 11   metFORMIN  (GLUCOPHAGE -XR) 500 MG 24 hr tablet Take 1,000 mg by mouth 2 (two) times daily.     potassium chloride  SA (KLOR-CON  M) 20 MEQ tablet Take 1 tablet (20 mEq total) by mouth 2  (two) times daily. Take 1 tablet 3 times daily for 3 days, then twice daily (Patient taking differently: Take 20 mEq by mouth daily.) 60 tablet 1   No current facility-administered medications for this visit.    PHYSICAL EXAMINATION: Not performed   LABORATORY DATA:  I have reviewed the data as listed    Latest Ref Rng & Units 09/03/2023   10:39 AM 07/29/2023    3:13 PM 07/01/2023   12:35 PM  CBC  WBC 4.0 - 10.5 K/uL 7.0  8.3  7.2   Hemoglobin 12.0 - 15.0 g/dL 9.8  16.1  9.7   Hematocrit 36.0 - 46.0 % 30.3  31.0  31.0   Platelets 150 - 400 K/uL 349  402  458         Latest Ref Rng & Units 09/03/2023   10:39 AM 07/29/2023    3:13 PM 07/01/2023   12:35 PM  CMP  Glucose 70 - 99 mg/dL 096  045  409   BUN 6 - 20 mg/dL 16  12  14    Creatinine 0.44 - 1.00 mg/dL 8.11  9.14  7.82   Sodium 135 - 145 mmol/L 139  137  135   Potassium 3.5 - 5.1 mmol/L 4.0  3.9  4.0   Chloride 98 - 111 mmol/L 102  101  100   CO2 22 - 32 mmol/L 28  28  27    Calcium  8.9 - 10.3 mg/dL 9.6  9.9  9.8   Total Protein 6.5 - 8.1 g/dL 7.7  8.0  8.1   Total Bilirubin 0.0 - 1.2 mg/dL 0.3  0.3  0.3   Alkaline Phos 38 - 126 U/L 82  81  77   AST 15 - 41 U/L 15  16  16    ALT 0 - 44 U/L 13  18  15        RADIOGRAPHIC STUDIES: I have personally reviewed the radiological images as listed and agreed with the findings in the report. No results found.     I discussed the assessment and treatment plan with the patient. The patient was provided an opportunity to ask questions and all were answered. The patient agreed with the plan and demonstrated an understanding of the instructions.   The patient was advised to call back or seek an in-person evaluation if the symptoms worsen or if the condition fails to improve as anticipated.  I provided 25 minutes of non face-to-face telephone visit time during this encounter, including review of chart and various tests results, discussions about plan of care and coordination of care  plan.    Sonja [place], MD 09/06/23

## 2023-09-06 NOTE — Assessment & Plan Note (Signed)
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  The left descending colon cancer was only 4 mm, removed by polypectomy. The largest 3 cm, fungating mass in the rectosigmoid junction is 15 cm from anal verge on colonoscopy, biopsy confirmed moderate differentiated adenocarcinoma -I reviewed her staging CT chest from November 18, 2022, which showed no definitive evidence of metastasis, but showed borderline enlarged thoracic adenopathy.  I will obtain a PET scan for further evaluation. -Her liver MRI unfortunately showed a 1.2 cm mass in the posterior dome of liver, segment 7, biopsy on 12/03/22 confirmed metastasis from colon cancer, I reviewed with her.  -Given her metastatic disease, I recommend chemotherapy first.  She started FOLFOX on 12/18/2022 and received 8 cycles before surgery -she underwent hemicolectomy on 05/03/23 and liver ablation on 05/22/23 by IR  -surgical path reviewed, ypT3N1b, surgical margins negative  -I recommend 2 more months adjuvant chemo FOLFOX.  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  She started Xeloda  on June 17, 2023 and completed in early May 2025 -Signatera was positive on 08/13/2023 and CT 09/03/2023 showed enlarged liver met. Will obtain PET -will refer her to surgery for liver resection

## 2023-09-07 ENCOUNTER — Other Ambulatory Visit: Payer: Self-pay

## 2023-09-09 ENCOUNTER — Ambulatory Visit: Admitting: Hematology

## 2023-09-09 ENCOUNTER — Encounter: Payer: Self-pay | Admitting: Hematology

## 2023-09-10 ENCOUNTER — Other Ambulatory Visit: Payer: Self-pay

## 2023-09-13 ENCOUNTER — Other Ambulatory Visit: Payer: Self-pay | Admitting: Diagnostic Radiology

## 2023-09-13 DIAGNOSIS — C228 Malignant neoplasm of liver, primary, unspecified as to type: Secondary | ICD-10-CM

## 2023-09-17 ENCOUNTER — Encounter (HOSPITAL_COMMUNITY)
Admission: RE | Admit: 2023-09-17 | Discharge: 2023-09-17 | Disposition: A | Discharge status: Home / Self Care | Source: Ambulatory Visit | Attending: Hematology | Admitting: Hematology

## 2023-09-17 DIAGNOSIS — C186 Malignant neoplasm of descending colon: Secondary | ICD-10-CM | POA: Insufficient documentation

## 2023-09-17 LAB — GLUCOSE, CAPILLARY: Glucose-Capillary: 188 mg/dL — ABNORMAL HIGH (ref 70–99)

## 2023-09-17 MED ORDER — FLUDEOXYGLUCOSE F - 18 (FDG) INJECTION
13.5000 | Freq: Once | INTRAVENOUS | Status: AC
  Administered 2023-09-17: 13.5 via INTRAVENOUS

## 2023-09-18 ENCOUNTER — Other Ambulatory Visit: Payer: Self-pay

## 2023-10-03 ENCOUNTER — Encounter: Payer: Self-pay | Admitting: Hematology

## 2023-10-03 ENCOUNTER — Inpatient Hospital Stay: Attending: Hematology

## 2023-10-03 ENCOUNTER — Inpatient Hospital Stay (HOSPITAL_BASED_OUTPATIENT_CLINIC_OR_DEPARTMENT_OTHER): Admitting: Hematology

## 2023-10-03 ENCOUNTER — Ambulatory Visit: Payer: Self-pay | Admitting: Surgery

## 2023-10-03 VITALS — BP 138/78 | HR 125 | Temp 98.2°F | Resp 18 | Ht 65.0 in | Wt 279.8 lb

## 2023-10-03 DIAGNOSIS — C787 Secondary malignant neoplasm of liver and intrahepatic bile duct: Secondary | ICD-10-CM | POA: Insufficient documentation

## 2023-10-03 DIAGNOSIS — C186 Malignant neoplasm of descending colon: Secondary | ICD-10-CM

## 2023-10-03 DIAGNOSIS — C19 Malignant neoplasm of rectosigmoid junction: Secondary | ICD-10-CM | POA: Insufficient documentation

## 2023-10-03 DIAGNOSIS — D649 Anemia, unspecified: Secondary | ICD-10-CM | POA: Insufficient documentation

## 2023-10-03 DIAGNOSIS — Z95828 Presence of other vascular implants and grafts: Secondary | ICD-10-CM

## 2023-10-03 DIAGNOSIS — K6389 Other specified diseases of intestine: Secondary | ICD-10-CM

## 2023-10-03 DIAGNOSIS — C189 Malignant neoplasm of colon, unspecified: Secondary | ICD-10-CM

## 2023-10-03 LAB — CBC WITH DIFFERENTIAL (CANCER CENTER ONLY)
Abs Immature Granulocytes: 0.02 10*3/uL (ref 0.00–0.07)
Basophils Absolute: 0.1 10*3/uL (ref 0.0–0.1)
Basophils Relative: 1 %
Eosinophils Absolute: 0.1 10*3/uL (ref 0.0–0.5)
Eosinophils Relative: 1 %
HCT: 30.8 % — ABNORMAL LOW (ref 36.0–46.0)
Hemoglobin: 10.1 g/dL — ABNORMAL LOW (ref 12.0–15.0)
Immature Granulocytes: 0 %
Lymphocytes Relative: 43 %
Lymphs Abs: 3 10*3/uL (ref 0.7–4.0)
MCH: 25.6 pg — ABNORMAL LOW (ref 26.0–34.0)
MCHC: 32.8 g/dL (ref 30.0–36.0)
MCV: 78.2 fL — ABNORMAL LOW (ref 80.0–100.0)
Monocytes Absolute: 0.6 10*3/uL (ref 0.1–1.0)
Monocytes Relative: 9 %
Neutro Abs: 3.2 10*3/uL (ref 1.7–7.7)
Neutrophils Relative %: 46 %
Platelet Count: 392 10*3/uL (ref 150–400)
RBC: 3.94 MIL/uL (ref 3.87–5.11)
RDW: 17.8 % — ABNORMAL HIGH (ref 11.5–15.5)
WBC Count: 6.9 10*3/uL (ref 4.0–10.5)
nRBC: 0 % (ref 0.0–0.2)

## 2023-10-03 LAB — CMP (CANCER CENTER ONLY)
ALT: 14 U/L (ref 0–44)
AST: 15 U/L (ref 15–41)
Albumin: 4.2 g/dL (ref 3.5–5.0)
Alkaline Phosphatase: 91 U/L (ref 38–126)
Anion gap: 9 (ref 5–15)
BUN: 12 mg/dL (ref 6–20)
CO2: 28 mmol/L (ref 22–32)
Calcium: 9.9 mg/dL (ref 8.9–10.3)
Chloride: 102 mmol/L (ref 98–111)
Creatinine: 0.75 mg/dL (ref 0.44–1.00)
GFR, Estimated: >60 mL/min (ref >60)
Glucose, Bld: 146 mg/dL — ABNORMAL HIGH (ref 70–99)
Potassium: 4.1 mmol/L (ref 3.5–5.1)
Sodium: 139 mmol/L (ref 135–145)
Total Bilirubin: 0.4 mg/dL (ref 0.0–1.2)
Total Protein: 8.1 g/dL (ref 6.5–8.1)

## 2023-10-03 LAB — CEA (ACCESS): CEA (CHCC): 4.78 ng/mL (ref 0.00–5.00)

## 2023-10-03 LAB — FERRITIN: Ferritin: 107 ng/mL (ref 11–307)

## 2023-10-03 MED ORDER — SODIUM CHLORIDE 0.9% FLUSH
10.0000 mL | Freq: Once | INTRAVENOUS | Status: AC
  Administered 2023-10-03: 10 mL

## 2023-10-03 MED ORDER — HEPARIN SOD (PORK) LOCK FLUSH 100 UNIT/ML IV SOLN
500.0000 [IU] | Freq: Once | INTRAVENOUS | Status: AC
  Administered 2023-10-03: 500 [IU]

## 2023-10-03 NOTE — H&P (View-Only) (Signed)
 History of Present Illness: Tammy Marks is a 58 y.o. 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 She was first diagnosed with a rectosigmoid cancer on a screening colonoscopy in June 2024. Staging workup showed a lesion in segment 7 of the liver that was suspicious for metastatic lesion. She underwent an US -guided biopsy of this lesion on 12/03/22, which confirmed metastatic adenocarcinoma. A follow up PET on 12/26/22 showed metabolic uptake in the known segment 7 lesion, as well as an additional hypermetabolic lesion in the anterior right liver in segment 8. She underwent 8 cycles neoadjuvant FOLFOX, and follow up CT scan showed a decrease in size of the known segment 7 metastatic lesion; the segment 8 lesion was not readily visible on CT. She underwent a left hemicolectomy on 05/03/23 by Dr. Debby, followed by percutaneous ablation of the liver met on 05/22/23 by IR. At that time, CT and ultrasound did not demonstrate any evidence of the second hypermetabolic lesion in segment 8. Two more months chemotherapy were completed with Xeloda  (she was not able to tolerate further oxaliplatin  due to neuropathy), which she finished in early May. She had a follow up CT scan on 5/27, which showed interval growth of the segment 7 liver lesion to 4.4cm (previously 14mm prior to ablation). She had a follow up PET on 6/10 which showed uptake in the segment 7 liver lesion, and uptake in the anterior lesion in segment 8. There was no other evidence of metastatic disease. Her CEA has normalized (now 2.2, previously 6 prior to treatment) but her ctDNA is elevated. Today she says she is feeling well. She denies any recent abdominal pain or loss of appetite.   In addition to her colon resection, previous abdominal surgeries include a C section and lap cholecystectomy. She has no known liver disease and does not drink alcohol. Her LFTs are normal. She currently works from home and lives alone.        Review of Systems: A complete review of systems was obtained from the patient.  I have reviewed this information and discussed as appropriate with the patient.  See HPI as well for other ROS.       Medical History: Past Medical History Past Medical History: Diagnosis Date  Diabetes mellitus without complication (CMS/HHS-HCC)    History of cancer         Problem List There is no problem list on file for this patient.     Past Surgical History Past Surgical History: Procedure Laterality Date  CESAREAN SECTION       x2  CHOLECYSTECTOMY      COLON SURGERY      HERNIA REPAIR          Allergies No Known Allergies    Medications Ordered Prior to Encounter Current Outpatient Medications on File Prior to Visit Medication Sig Dispense Refill  atorvastatin  (LIPITOR) 20 MG tablet        glipiZIDE  (GLUCOTROL  XL) 10 MG XL tablet Take 10 mg by mouth once daily      metFORMIN  (GLUCOPHAGE -XR) 500 MG XR tablet TAKE 2 TABLETS BY MOUTH WITH A MEAL TWICE DAILY        No current facility-administered medications on file prior to visit.      Family History Family History Problem Relation Age of Onset  Skin cancer Mother    High blood pressure (Hypertension) Mother    Obesity Father    High blood pressure (Hypertension) Father  Coronary Artery Disease (Blocked arteries around heart) Father    Diabetes Father        Tobacco Use History Social History    Tobacco Use Smoking Status Never Smokeless Tobacco Never      Social History Social History    Socioeconomic History  Marital status: Single Tobacco Use  Smoking status: Never  Smokeless tobacco: Never Vaping Use  Vaping status: Never Used Substance and Sexual Activity  Alcohol use: Not Currently  Drug use: Never    Social Drivers of Health    Food Insecurity: No Food Insecurity (05/03/2023)   Received from [facility]   Hunger Vital Sign    Within the past 12 months, you worried that your food  would run out before you got the money to buy more.: Never true    Within the past 12 months, the food you bought just didn't last and you didn't have money to get more.: Never true Transportation Needs: No Transportation Needs (05/03/2023)   Received from [facility] - Transportation    Lack of Transportation (Medical): No    Lack of Transportation (Non-Medical): No Social Connections: Moderately Isolated (05/03/2023)   Received from [facility]   Social Connection and Isolation Panel    In a typical week, how many times do you talk on the phone with family, friends, or neighbors?: More than three times a week    How often do you get together with friends or relatives?: Once a week    How often do you attend church or religious services?: More than 4 times per year    Do you belong to any clubs or organizations such as church groups, unions, fraternal or athletic groups, or school groups?: No    How often do you attend meetings of the clubs or organizations you belong to?: Never    Are you married, widowed, divorced, separated, never married, or living with a partner?: Never married Housing Stability: Unknown (05/21/2023)   Housing Stability Vital Sign    Homeless in the Last Year: No      Objective:     Vitals:   10/03/23 1135 BP: 132/80 Pulse: 102 Temp: 36.7 C (98 F) SpO2: 98% Weight: (!) 125.6 kg (277 lb) PainSc: 0-No pain   Body mass index is 46.1 kg/m.   Physical Exam Vitals reviewed.  Constitutional:      General: She is not in acute distress.    Appearance: Normal appearance.  HENT:     Head: Normocephalic and atraumatic.    Cardiovascular:     Rate and Rhythm: Normal rate and regular rhythm.  Pulmonary:     Effort: Pulmonary effort is normal. No respiratory distress.     Breath sounds: Normal breath sounds. No wheezing.  Abdominal:     General: There is no distension.     Palpations: Abdomen is soft.     Tenderness: There is no abdominal  tenderness.     Comments: Multiple well-healed port site surgical scars.    Skin:    General: Skin is warm and dry.    Neurological:     General: No focal deficit present.     Mental Status: She is alert and oriented to person, place, and time.              Assessment and Plan:   Assessment Diagnoses and all orders for this visit:   Metastatic colon cancer to liver (CMS/HHS-HCC)       58 yo female  with stage IV colon cancer with oligometastatic disease to the liver. She has undergone resection of the primary, as well as ablation of the biopsy proven segment 7 liver lesion. There appears to be persistent disease at the segment 7 lesion, as ctDNA remains elevated with no other known sites of disease outside the liver. There is also a suspicious PET-avid lesion anteriorly in the periphery of segment 8. Both of these lesions are technically amenable to resection. There has been some recent growth but this has occurred after cessation of systemic FOLFOX. She also had initial radiographic response to systemic treatment, and her CEA normalized and remains normal. She is young with no other known sites of disease so I recommended proceeding with hepatic metastasectomy. The goal would cure of her disease but I did discuss she is risk for recurrence. We reviewed the benefits and risks of open partial hepatectomy, including the risks of bleeding and the small risk of post-hepatectomy liver failure (I do not anticipate resecting enough parenchyma for this to be a significant risk). We also reviewed the anticipated recovery course. We also reviewed avoiding fatty foods as she has hepatomegaly and steatosis, which can lead to increased bleeding during liver resection. She expressed understanding and agrees to proceed with surgery. She will be scheduled for surgery tentatively on July 8. Her case will also be reviewed again at multidisciplinary tumor board.   Leonor Dawn, MD [facility]  Surgery General, Hepatobiliary and Pancreatic Surgery 10/03/23 12:13 PM

## 2023-10-03 NOTE — H&P (Signed)
 History of Present Illness: Tammy Marks is a 58 y.o. 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 She was first diagnosed with a rectosigmoid cancer on a screening colonoscopy in June 2024. Staging workup showed a lesion in segment 7 of the liver that was suspicious for metastatic lesion. She underwent an US -guided biopsy of this lesion on 12/03/22, which confirmed metastatic adenocarcinoma. A follow up PET on 12/26/22 showed metabolic uptake in the known segment 7 lesion, as well as an additional hypermetabolic lesion in the anterior right liver in segment 8. She underwent 8 cycles neoadjuvant FOLFOX, and follow up CT scan showed a decrease in size of the known segment 7 metastatic lesion; the segment 8 lesion was not readily visible on CT. She underwent a left hemicolectomy on 05/03/23 by Dr. Debby, followed by percutaneous ablation of the liver met on 05/22/23 by IR. At that time, CT and ultrasound did not demonstrate any evidence of the second hypermetabolic lesion in segment 8. Two more months chemotherapy were completed with Xeloda  (she was not able to tolerate further oxaliplatin  due to neuropathy), which she finished in early May. She had a follow up CT scan on 5/27, which showed interval growth of the segment 7 liver lesion to 4.4cm (previously 14mm prior to ablation). She had a follow up PET on 6/10 which showed uptake in the segment 7 liver lesion, and uptake in the anterior lesion in segment 8. There was no other evidence of metastatic disease. Her CEA has normalized (now 2.2, previously 6 prior to treatment) but her ctDNA is elevated. Today she says she is feeling well. She denies any recent abdominal pain or loss of appetite.   In addition to her colon resection, previous abdominal surgeries include a C section and lap cholecystectomy. She has no known liver disease and does not drink alcohol. Her LFTs are normal. She currently works from home and lives alone.        Review of Systems: A complete review of systems was obtained from the patient.  I have reviewed this information and discussed as appropriate with the patient.  See HPI as well for other ROS.       Medical History: Past Medical History Past Medical History: Diagnosis Date  Diabetes mellitus without complication (CMS/HHS-HCC)    History of cancer         Problem List There is no problem list on file for this patient.     Past Surgical History Past Surgical History: Procedure Laterality Date  CESAREAN SECTION       x2  CHOLECYSTECTOMY      COLON SURGERY      HERNIA REPAIR          Allergies No Known Allergies    Medications Ordered Prior to Encounter Current Outpatient Medications on File Prior to Visit Medication Sig Dispense Refill  atorvastatin  (LIPITOR) 20 MG tablet        glipiZIDE  (GLUCOTROL  XL) 10 MG XL tablet Take 10 mg by mouth once daily      metFORMIN  (GLUCOPHAGE -XR) 500 MG XR tablet TAKE 2 TABLETS BY MOUTH WITH A MEAL TWICE DAILY        No current facility-administered medications on file prior to visit.      Family History Family History Problem Relation Age of Onset  Skin cancer Mother    High blood pressure (Hypertension) Mother    Obesity Father    High blood pressure (Hypertension) Father  Coronary Artery Disease (Blocked arteries around heart) Father    Diabetes Father        Tobacco Use History Social History    Tobacco Use Smoking Status Never Smokeless Tobacco Never      Social History Social History    Socioeconomic History  Marital status: Single Tobacco Use  Smoking status: Never  Smokeless tobacco: Never Vaping Use  Vaping status: Never Used Substance and Sexual Activity  Alcohol use: Not Currently  Drug use: Never    Social Drivers of Health    Food Insecurity: No Food Insecurity (05/03/2023)   Received from [facility]   Hunger Vital Sign    Within the past 12 months, you worried that your food  would run out before you got the money to buy more.: Never true    Within the past 12 months, the food you bought just didn't last and you didn't have money to get more.: Never true Transportation Needs: No Transportation Needs (05/03/2023)   Received from [facility] - Transportation    Lack of Transportation (Medical): No    Lack of Transportation (Non-Medical): No Social Connections: Moderately Isolated (05/03/2023)   Received from [facility]   Social Connection and Isolation Panel    In a typical week, how many times do you talk on the phone with family, friends, or neighbors?: More than three times a week    How often do you get together with friends or relatives?: Once a week    How often do you attend church or religious services?: More than 4 times per year    Do you belong to any clubs or organizations such as church groups, unions, fraternal or athletic groups, or school groups?: No    How often do you attend meetings of the clubs or organizations you belong to?: Never    Are you married, widowed, divorced, separated, never married, or living with a partner?: Never married Housing Stability: Unknown (05/21/2023)   Housing Stability Vital Sign    Homeless in the Last Year: No      Objective:     Vitals:   10/03/23 1135 BP: 132/80 Pulse: 102 Temp: 36.7 C (98 F) SpO2: 98% Weight: (!) 125.6 kg (277 lb) PainSc: 0-No pain   Body mass index is 46.1 kg/m.   Physical Exam Vitals reviewed.  Constitutional:      General: She is not in acute distress.    Appearance: Normal appearance.  HENT:     Head: Normocephalic and atraumatic.    Cardiovascular:     Rate and Rhythm: Normal rate and regular rhythm.  Pulmonary:     Effort: Pulmonary effort is normal. No respiratory distress.     Breath sounds: Normal breath sounds. No wheezing.  Abdominal:     General: There is no distension.     Palpations: Abdomen is soft.     Tenderness: There is no abdominal  tenderness.     Comments: Multiple well-healed port site surgical scars.    Skin:    General: Skin is warm and dry.    Neurological:     General: No focal deficit present.     Mental Status: She is alert and oriented to person, place, and time.              Assessment and Plan:   Assessment Diagnoses and all orders for this visit:   Metastatic colon cancer to liver (CMS/HHS-HCC)       58 yo female  with stage IV colon cancer with oligometastatic disease to the liver. She has undergone resection of the primary, as well as ablation of the biopsy proven segment 7 liver lesion. There appears to be persistent disease at the segment 7 lesion, as ctDNA remains elevated with no other known sites of disease outside the liver. There is also a suspicious PET-avid lesion anteriorly in the periphery of segment 8. Both of these lesions are technically amenable to resection. There has been some recent growth but this has occurred after cessation of systemic FOLFOX. She also had initial radiographic response to systemic treatment, and her CEA normalized and remains normal. She is young with no other known sites of disease so I recommended proceeding with hepatic metastasectomy. The goal would cure of her disease but I did discuss she is risk for recurrence. We reviewed the benefits and risks of open partial hepatectomy, including the risks of bleeding and the small risk of post-hepatectomy liver failure (I do not anticipate resecting enough parenchyma for this to be a significant risk). We also reviewed the anticipated recovery course. We also reviewed avoiding fatty foods as she has hepatomegaly and steatosis, which can lead to increased bleeding during liver resection. She expressed understanding and agrees to proceed with surgery. She will be scheduled for surgery tentatively on July 8. Her case will also be reviewed again at multidisciplinary tumor board.   Leonor Dawn, MD [facility]  Surgery General, Hepatobiliary and Pancreatic Surgery 10/03/23 12:13 PM

## 2023-10-03 NOTE — Progress Notes (Signed)
 [facility]   [phone] [phone]   Clinic Follow up Note   Patient Care Team: Early, [name], NP as PCP - General (Nurse Practitioner) Lanny Callander, MD as Consulting Physician (Hematology and Oncology) Debby Hila, MD as Consulting Physician (General Surgery) Kriss Estefana DEL, DO as Consulting Physician (Gastroenterology)  Date of Service:  10/03/2023  CHIEF COMPLAINT: f/u of colon cancer  CURRENT THERAPY:  Pending surgery  Oncology History   Cancer of left colon (HCC) --cTxN0M1, stage IV with oligo liver metastasis, MMR normal, 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  The left descending colon cancer was only 4 mm, removed by polypectomy. The largest 3 cm, fungating mass in the rectosigmoid junction is 15 cm from anal verge on colonoscopy, biopsy confirmed moderate differentiated adenocarcinoma -I reviewed her staging CT chest from November 18, 2022, which showed no definitive evidence of metastasis, but showed borderline enlarged thoracic adenopathy.  I will obtain a PET scan for further evaluation. -Her liver MRI unfortunately showed a 1.2 cm mass in the posterior dome of liver, segment 7, biopsy on 12/03/22 confirmed metastasis from colon cancer, I reviewed with her.  -Given her metastatic disease, I recommend chemotherapy first.  She started FOLFOX on 12/18/2022 and received 8 cycles before surgery -she underwent hemicolectomy on 05/03/23 and liver ablation on 05/22/23 by IR  -surgical path reviewed, ypT3N1b, surgical margins negative  -I recommend 2 more months adjuvant chemo FOLFOX.  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  She started Xeloda  on June 17, 2023 and completed in early May 2025 -Signatera was positive on 08/13/2023 and CT 09/03/2023 showed enlarged liver met. PET 09/17/2023 showed 2 hypermetabolic liver lesion, consistent with metastasis.   There is also a mild hypermetabolism at the surgical site in the sigmoid colon, I recommended repeating colonoscopy. -will refer her to surgery for liver resection   Assessment & Plan Metastatic colon cancer Metastatic colon cancer with two liver metastases, classified as stage IV. The primary tumor is resolved, but previous treatments, including surgery and chemotherapy, did not completely eradicate the cancer. The prognosis is favorable due to the limited number of metastases, with a cure rate of approximately 20%. - Proceed with scheduled surgery on July 8th to remove liver metastases. - Repeat Signatera test one month post-surgery to assess tumor marker levels. - Perform CT scan two to three months post-surgery to monitor for recurrence. - If Signatera or scan is positive, consider restarting chemotherapy. - Send a message to Dr. Kriss to schedule a partial colonoscopy before surgery if possible.  Peripheral neuropathy Peripheral neuropathy in the feet, likely a residual effect from chemotherapy. Neuropathy in the hands has resolved.  Mild anemia Mild anemia with monitored iron  levels. She is not consistently taking iron  supplements. - Continue taking iron  supplements as previously prescribed.  Plan - Patient was seen by surgeon Dr. Dasie today, plan to have liver resection on July 8 - Will reach out to her GI Dr. Kriss for repeated colonoscopy due to slightly abnormal PET scan - Follow-up in 6 weeks, we will repeat Signatera on next visit.    SUMMARY OF ONCOLOGIC HISTORY: Oncology History  Cancer of left colon (HCC)  11/21/2022 Initial Diagnosis   Cancer of left colon (HCC)   12/18/2022 -  Chemotherapy   Patient is on Treatment Plan : COLORECTAL FOLFOX q14d     12/26/2022 PET scan   NM PET skull  base to thigh  IMPRESSION: 1. Two hypermetabolic liver lesions consistent with metastatic disease. 2. Small hypermetabolic celiac axis and sigmoid mesocolon nodes suggesting  metastatic adenopathy. 3. No findings for metastatic disease involving the chest or bony structures. 4. Diffuse hypermetabolism throughout the colon possibly related to recently eating or taking insulin  making it difficult to identified the patient's sigmoid cancer.   05/03/2023 Cancer Staging   Staging form: Colon and Rectum, AJCC 8th Edition - Pathologic stage from 05/03/2023: Stage IVA (pT3, pN1b, pM1a) - Signed by Lanny Callander, MD on 06/04/2023 Histologic grading system: 4 grade system Histologic grade (G): G2 Residual tumor (R): R0      Discussed the use of AI scribe software for clinical note transcription with the patient, who gave verbal consent to proceed.  History of Present Illness Tammy Marks is a 58 year old female with metastatic colon cancer who presents for follow-up.  She is scheduled for surgery on July 8th to address two metastatic liver lesions. The primary colon cancer has been treated, but metastasis to the liver persists. She completed her last course of oral chemotherapy in early May.  She experiences residual neuropathy in her feet, though neuropathy in her hands has resolved. She is not currently taking iron  supplements regularly, having missed a few days last week. She still has a port in place from previous treatments.  A PET scan indicated a small asymmetric area of uptake distal to the suture line in the colon.     All other systems were reviewed with the patient and are negative.  MEDICAL HISTORY:  Past Medical History:  Diagnosis Date   Anemia    Cancer (HCC)    Colon cancer (HCC) 10/30/2022   surgery 05/03/23   Diabetes mellitus without complication [facility])    Personal history of chemotherapy 2024   colon ca    SURGICAL HISTORY: Past Surgical History:  Procedure Laterality Date   CESAREAN SECTION     CHOLECYSTECTOMY     IR IMAGING GUIDED PORT INSERTION  12/03/2022   IR RADIOLOGIST EVAL & MGMT  03/27/2023   IR RADIOLOGIST EVAL & MGMT   06/26/2023   IR US  LIVER BIOPSY  12/03/2022   RADIOLOGY WITH ANESTHESIA N/A 05/22/2023   Procedure: CT MICROWAVE ABLATION WITH ANESTHESIA;  Surgeon: Alona Corners, DO;  Location: [facility];  Service: Anesthesiology;  Laterality: N/A;    I have reviewed the social history and family history with the patient and they are unchanged from previous note.  ALLERGIES:  is allergic to oxaliplatin .  MEDICATIONS:  Current Outpatient Medications  Medication Sig Dispense Refill   atorvastatin  (LIPITOR) 20 MG tablet Take 20 mg by mouth daily.     gabapentin  (NEURONTIN ) 100 MG capsule Take 1 capsule po once or twice during daytime, and 2-3 capsules at bedtime 90 capsule 1   glipiZIDE  (GLUCOTROL  XL) 10 MG 24 hr tablet Take 10 mg by mouth daily.     Iron , Ferrous Sulfate , 325 (65 Fe) MG TABS Take 325 mg by mouth daily. 30 tablet 11   metFORMIN  (GLUCOPHAGE -XR) 500 MG 24 hr tablet Take 1,000 mg by mouth 2 (two) times daily.     potassium chloride  SA (KLOR-CON  M) 20 MEQ tablet Take 1 tablet (20 mEq total) by mouth 2 (two) times daily. Take 1 tablet 3 times daily for 3 days, then twice daily (Patient taking differently: Take 20 mEq by mouth daily.) 60 tablet 1   No current facility-administered medications for this visit.  PHYSICAL EXAMINATION: ECOG PERFORMANCE STATUS: 1 - Symptomatic but completely ambulatory  Vitals:   10/03/23 1318  BP: 138/78  Pulse: (!) 125  Resp: 18  Temp: 98.2 F (36.8 C)  SpO2: 99%   Wt Readings from Last 3 Encounters:  10/03/23 279 lb 12.8 oz (126.9 kg)  07/29/23 270 lb 14.4 oz (122.9 kg)  07/01/23 267 lb 11.2 oz (121.4 kg)     GENERAL:alert, no distress and comfortable SKIN: skin color, texture, turgor are normal, no rashes or significant lesions EYES: normal, Conjunctiva are pink and non-injected, sclera clear Musculoskeletal:no cyanosis of digits and no clubbing  NEURO: alert & oriented x 3 with fluent speech, no focal motor/sensory deficits  Physical  Exam    LABORATORY DATA:  I have reviewed the data as listed    Latest Ref Rng & Units 10/03/2023   12:48 PM 09/03/2023   10:39 AM 07/29/2023    3:13 PM  CBC  WBC 4.0 - 10.5 K/uL 6.9  7.0  8.3   Hemoglobin 12.0 - 15.0 g/dL 89.8  9.8  89.8   Hematocrit 36.0 - 46.0 % 30.8  30.3  31.0   Platelets 150 - 400 K/uL 392  349  402         Latest Ref Rng & Units 10/03/2023   12:48 PM 09/03/2023   10:39 AM 07/29/2023    3:13 PM  CMP  Glucose 70 - 99 mg/dL 853  864  890   BUN 6 - 20 mg/dL 12  16  12    Creatinine 0.44 - 1.00 mg/dL 9.24  9.29  9.33   Sodium 135 - 145 mmol/L 139  139  137   Potassium 3.5 - 5.1 mmol/L 4.1  4.0  3.9   Chloride 98 - 111 mmol/L 102  102  101   CO2 22 - 32 mmol/L 28  28  28    Calcium  8.9 - 10.3 mg/dL 9.9  9.6  9.9   Total Protein 6.5 - 8.1 g/dL 8.1  7.7  8.0   Total Bilirubin 0.0 - 1.2 mg/dL 0.4  0.3  0.3   Alkaline Phos 38 - 126 U/L 91  82  81   AST 15 - 41 U/L 15  15  16    ALT 0 - 44 U/L 14  13  18        RADIOGRAPHIC STUDIES: I have personally reviewed the radiological images as listed and agreed with the findings in the report. No results found.    No orders of the defined types were placed in this encounter.  All questions were answered. The patient knows to call the clinic with any problems, questions or concerns. No barriers to learning was detected. The total time spent in the appointment was 25 minutes, including review of chart and various tests results, discussions about plan of care and coordination of care plan     Onita Mattock, MD 10/03/2023

## 2023-10-03 NOTE — Assessment & Plan Note (Addendum)
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  The left descending colon cancer was only 4 mm, removed by polypectomy. The largest 3 cm, fungating mass in the rectosigmoid junction is 15 cm from anal verge on colonoscopy, biopsy confirmed moderate differentiated adenocarcinoma -I reviewed her staging CT chest from November 18, 2022, which showed no definitive evidence of metastasis, but showed borderline enlarged thoracic adenopathy.  I will obtain a PET scan for further evaluation. -Her liver MRI unfortunately showed a 1.2 cm mass in the posterior dome of liver, segment 7, biopsy on 12/03/22 confirmed metastasis from colon cancer, I reviewed with her.  -Given her metastatic disease, I recommend chemotherapy first.  She started FOLFOX on 12/18/2022 and received 8 cycles before surgery -she underwent hemicolectomy on 05/03/23 and liver ablation on 05/22/23 by IR  -surgical path reviewed, ypT3N1b, surgical margins negative  -I recommend 2 more months adjuvant chemo FOLFOX.  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  She started Xeloda  on June 17, 2023 and completed in early May 2025 -Signatera was positive on 08/13/2023 and CT 09/03/2023 showed enlarged liver met. PET 09/17/2023 showed 2 hypermetabolic liver lesion, consistent with metastasis.  There is also a mild hypermetabolism at the surgical site in the sigmoid colon, I recommended repeating colonoscopy. -will refer her to surgery for liver resection

## 2023-10-04 ENCOUNTER — Other Ambulatory Visit: Payer: Self-pay

## 2023-10-05 ENCOUNTER — Other Ambulatory Visit: Payer: Self-pay

## 2023-10-07 ENCOUNTER — Other Ambulatory Visit: Payer: Self-pay

## 2023-10-07 NOTE — Progress Notes (Signed)
 Per Dr. Lanny, draw next Signatera when pt returns to clinic which will be 11/18/2023.  Updated appt note for lab on 11/18/2023.  Standing order for Signatera already entered in EPIC.  Requisition and Signatera Kit given to [employer] in [employer].

## 2023-10-08 NOTE — Progress Notes (Signed)
 Surgical Instructions   Your procedure is scheduled on Tuesday, July 8th, 2025. Report to [facility] Main Entrance A at 11:30 A.M., then check in with the Admitting office. Any questions or running late day of surgery: call [phone]  Questions prior to your surgery date: call [phone], Monday-Friday, 8am-4pm. If you experience any cold or flu symptoms such as cough, fever, chills, shortness of breath, etc. between now and your scheduled surgery, please notify us  at the above number.     Remember:  Do not eat after midnight the night before your surgery   You may drink clear liquids until 10:30 the morning of your surgery.   Clear liquids allowed are: Water, Non-Citrus Juices (without pulp), Carbonated Beverages, Clear Tea (no milk, honey, etc.), Black Coffee Only (NO MILK, CREAM OR POWDERED CREAMER of any kind), and Gatorade.  Patient Instructions  The night before surgery:  No food after midnight. ONLY clear liquids after midnight Drink TWO (2) 12 oz G2 given to you in your pre admission testing appointment. This drink was given to you during your hospital  pre-op appointment visit.   The day of surgery (if you have diabetes): Drink ONE (1) 12 oz G2 given to you in your pre admission testing appointment by 10:30 the morning of surgery. Drink in one sitting. Do not sip.  This drink was given to you during your hospital  pre-op appointment visit.  Nothing else to drink after completing the  12 oz bottle of G2.         If you have questions, please contact your surgeon's office.     Take these medicines the morning of surgery with A SIP OF WATER: Atorvastatin  (Lipitor) Gabapentin  (Neurontin )   May take these medicines IF NEEDED: None.    One week prior to surgery, STOP taking any Aspirin (unless otherwise instructed by your surgeon) Aleve, Naproxen, Ibuprofen, Motrin, Advil, Goody's, BC's, all herbal medications, fish oil, and non-prescription vitamins.   WHAT  DO I DO ABOUT MY DIABETES MEDICATION?   Do not take oral diabetes medicines (pills) the morning of surgery.  Do NOT take Glipizide  (Glucotrol  XL) or Metformin  (Glucophage -XR) the morning of surgery.      HOW TO MANAGE YOUR DIABETES BEFORE AND AFTER SURGERY  Why is it important to control my blood sugar before and after surgery? Improving blood sugar levels before and after surgery helps healing and can limit problems. A way of improving blood sugar control is eating a healthy diet by:  Eating less sugar and carbohydrates  Increasing activity/exercise  Talking with your doctor about reaching your blood sugar goals High blood sugars (greater than 180 mg/dL) can raise your risk of infections and slow your recovery, so you will need to focus on controlling your diabetes during the weeks before surgery. Make sure that the doctor who takes care of your diabetes knows about your planned surgery including the date and location.  How do I manage my blood sugar before surgery? Check your blood sugar at least 4 times a day, starting 2 days before surgery, to make sure that the level is not too high or low.  Check your blood sugar the morning of your surgery when you wake up and every 2 hours until you get to the Short Stay unit.  If your blood sugar is less than 70 mg/dL, you will need to treat for low blood sugar: Do not take insulin . Treat a low blood sugar (less than 70 mg/dL) with  cup of  clear juice (cranberry or apple), 4 glucose tablets, OR glucose gel. Recheck blood sugar in 15 minutes after treatment (to make sure it is greater than 70 mg/dL). If your blood sugar is not greater than 70 mg/dL on recheck, call [phone] for further instructions. Report your blood sugar to the short stay nurse when you get to Short Stay.  If you are admitted to the hospital after surgery: Your blood sugar will be checked by the staff and you will probably be given insulin  after surgery (instead of oral  diabetes medicines) to make sure you have good blood sugar levels. The goal for blood sugar control after surgery is 80-180 mg/dL.                      Do NOT Smoke (Tobacco/Vaping) for 24 hours prior to your procedure.  If you use a CPAP at night, you may bring your mask/headgear for your overnight stay.   You will be asked to remove any contacts, glasses, piercing's, hearing aid's, dentures/partials prior to surgery. Please bring cases for these items if needed.    Patients discharged the day of surgery will not be allowed to drive home, and someone needs to stay with them for 24 hours.  SURGICAL WAITING ROOM VISITATION Patients may have no more than 2 support people in the waiting area - these visitors may rotate.   Pre-op nurse will coordinate an appropriate time for 1 ADULT support person, who may not rotate, to accompany patient in pre-op.  Children under the age of 55 must have an adult with them who is not the patient and must remain in the main waiting area with an adult.  If the patient needs to stay at the hospital during part of their recovery, the visitor guidelines for inpatient rooms apply.  Please refer to the [facility] website for the visitor guidelines for any additional information.   If you received a COVID test during your pre-op visit  it is requested that you wear a mask when out in public, stay away from anyone that may not be feeling well and notify your surgeon if you develop symptoms. If you have been in contact with anyone that has tested positive in the last 10 days please notify you surgeon.      Pre-operative CHG Bathing Instructions   You can play a key role in reducing the risk of infection after surgery. Your skin needs to be as free of germs as possible. You can reduce the number of germs on your skin by washing with CHG (chlorhexidine  gluconate) soap before surgery. CHG is an antiseptic soap that kills germs and continues to kill germs even after  washing.   DO NOT use if you have an allergy to chlorhexidine /CHG or antibacterial soaps. If your skin becomes reddened or irritated, stop using the CHG and notify one of our RNs at [phone].              TAKE A SHOWER THE NIGHT BEFORE SURGERY AND THE DAY OF SURGERY    Please keep in mind the following:  DO NOT shave, including legs and underarms, 48 hours prior to surgery.   You may shave your face before/day of surgery.  Place clean sheets on your bed the night before surgery Use a clean washcloth (not used since being washed) for each shower. DO NOT sleep with pet's night before surgery.  CHG Shower Instructions:  Wash your face and private area with normal soap.  If you choose to wash your hair, wash first with your normal shampoo.  After you use shampoo/soap, rinse your hair and body thoroughly to remove shampoo/soap residue.  Turn the water OFF and apply half the bottle of CHG soap to a CLEAN washcloth.  Apply CHG soap ONLY FROM YOUR NECK DOWN TO YOUR TOES (washing for 3-5 minutes)  DO NOT use CHG soap on face, private areas, open wounds, or sores.  Pay special attention to the area where your surgery is being performed.  If you are having back surgery, having someone wash your back for you may be helpful. Wait 2 minutes after CHG soap is applied, then you may rinse off the CHG soap.  Pat dry with a clean towel  Put on clean pajamas    Additional instructions for the day of surgery: DO NOT APPLY any lotions, deodorants, cologne, or perfumes.   Do not wear jewelry or makeup Do not wear nail polish, gel polish, artificial nails, or any other type of covering on natural nails (fingers and toes) Do not bring valuables to the hospital. [facility] is not responsible for valuables/personal belongings. Put on clean/comfortable clothes.  Please brush your teeth.  Ask your nurse before applying any prescription medications to the skin.

## 2023-10-09 ENCOUNTER — Other Ambulatory Visit: Payer: Self-pay

## 2023-10-09 ENCOUNTER — Inpatient Hospital Stay (HOSPITAL_COMMUNITY)
Admission: RE | Admit: 2023-10-09 | Discharge: 2023-10-09 | Disposition: A | Discharge status: Home / Self Care | Source: Ambulatory Visit

## 2023-10-09 ENCOUNTER — Encounter (HOSPITAL_COMMUNITY): Payer: Self-pay

## 2023-10-09 ENCOUNTER — Telehealth: Payer: Self-pay

## 2023-10-09 NOTE — Telephone Encounter (Signed)
 Spoke with Cloretta w/Dr. Herminia office per Dr. Demetra request.  Stated Dr. Lanny is requesting for Dr. Kriss to give her a call on her cell phone regarding this mutual pt.  Stated the pt is scheduled for liver surgery on Tuesday 10/15/2023 and Dr. Lanny would like to speak with Dr. Kriss before the pt has surgery.  Provided Sharron Dr Demetra cellphone number.  Cloretta stated she will send the message to Dr. Kriss.

## 2023-10-10 ENCOUNTER — Encounter (HOSPITAL_COMMUNITY): Payer: Self-pay

## 2023-10-10 ENCOUNTER — Other Ambulatory Visit: Payer: Self-pay

## 2023-10-10 ENCOUNTER — Encounter (HOSPITAL_COMMUNITY)
Admission: RE | Admit: 2023-10-10 | Discharge: 2023-10-10 | Disposition: A | Discharge status: Home / Self Care | Source: Ambulatory Visit | Attending: Surgery | Admitting: Surgery

## 2023-10-10 VITALS — BP 133/78 | HR 93 | Temp 98.2°F | Resp 18 | Ht 65.0 in | Wt 280.4 lb

## 2023-10-10 DIAGNOSIS — C189 Malignant neoplasm of colon, unspecified: Secondary | ICD-10-CM

## 2023-10-10 DIAGNOSIS — Z01812 Encounter for preprocedural laboratory examination: Secondary | ICD-10-CM | POA: Diagnosis present

## 2023-10-10 DIAGNOSIS — E11628 Type 2 diabetes mellitus with other skin complications: Secondary | ICD-10-CM | POA: Insufficient documentation

## 2023-10-10 LAB — GLUCOSE, CAPILLARY: Glucose-Capillary: 159 mg/dL — ABNORMAL HIGH (ref 70–99)

## 2023-10-10 NOTE — Progress Notes (Signed)
 PCP - Camie Doing, NP Oncologist: Dr. Onita Keys Cardiologist -   PPM/ICD - Denies Device Orders - na Rep Notified - na  Chest x-ray/CT: 09/03/2023 EKG - 04/26/2023 Stress Test -  ECHO -  Cardiac Cath -   Sleep Study - denies CPAP - na  Type II diabetic.  Blood sugar 159 at PAT appointment.  A1C drawn at PAT appointment.  Fasting Blood Sugar : 140-160 Checks Blood Sugar two to three times per week.   Last dose of GLP1 agonist-  denies GLP1 instructions: na  Blood Thinner Instructions: denies Aspirin Instructions:denies  ERAS Protcol - G2 x 3 until 1030  Anesthesia review: No  Patient denies shortness of breath, fever, cough and chest pain at PAT appointment   All instructions explained to the patient, with a verbal understanding of the material. Patient agrees to go over the instructions while at home for a better understanding. Patient also instructed to self quarantine after being tested for COVID-19. The opportunity to ask questions was provided.

## 2023-10-11 ENCOUNTER — Encounter: Payer: Self-pay | Admitting: Hematology

## 2023-10-11 LAB — HEMOGLOBIN A1C
Hgb A1c MFr Bld: 8.1 % — ABNORMAL HIGH (ref 4.8–5.6)
Mean Plasma Glucose: 186 mg/dL

## 2023-10-11 NOTE — Progress Notes (Signed)
 The proposed treatment discussed in conference is for discussion purpose only and is not a binding recommendation.  The patients have not been physically examined, or presented with their treatment options.  Therefore, final treatment plans cannot be decided.

## 2023-10-14 NOTE — Anesthesia Preprocedure Evaluation (Signed)
 Anesthesia Evaluation  Patient identified by MRN, date of birth, ID band Patient awake    Reviewed: Allergy & Precautions, NPO status , Patient's Chart, lab work & pertinent test results  Airway Mallampati: III  TM Distance: >3 FB     Dental no notable dental hx. (+) Teeth Intact, Dental Advisory Given   Pulmonary neg pulmonary ROS   Pulmonary exam normal breath sounds clear to auscultation       Cardiovascular hypertension (133/89 no home meds), Normal cardiovascular exam Rhythm:Regular Rate:Normal     Neuro/Psych negative neurological ROS  negative psych ROS   GI/Hepatic Secondary neoplasm Liver Colon Ca mets to liver S/P left hemicolectomy   Endo/Other  diabetes, Well Controlled, Type 2, Oral Hypoglycemic Agents  Class 3 obesityBMI 43 HLD  Renal/GU negative Renal ROS  negative genitourinary   Musculoskeletal negative musculoskeletal ROS (+)    Abdominal  (+) + obese  Peds  Hematology  (+) Blood dyscrasia, anemia   Anesthesia Other Findings   Reproductive/Obstetrics negative OB ROS                              Anesthesia Physical Anesthesia Plan  ASA: 3  Anesthesia Plan: General   Post-op Pain Management: Precedex , Tylenol  PO (pre-op)*, Dilaudid  IV and Regional block*   Induction: Intravenous  PONV Risk Score and Plan: 4 or greater and Ondansetron , Dexamethasone , Midazolam  and Treatment may vary due to age or medical condition  Airway Management Planned: Oral ETT  Additional Equipment: Arterial line  Intra-op Plan:   Post-operative Plan: Extubation in OR  Informed Consent: I have reviewed the patients History and Physical, chart, labs and discussed the procedure including the risks, benefits and alternatives for the proposed anesthesia with the patient or authorized representative who has indicated his/her understanding and acceptance.     Dental advisory given  Plan  Discussed with: CRNA and Anesthesiologist  Anesthesia Plan Comments: (2 x large bore IV, +/- CVL Thoracic epidural for Post op pain mgmt.  Prior airway note: Laryngoscope Size: Mac and 3 Grade View: Grade II Tube type: Oral Tube size: 7.0 mm Number of attempts: 1 )        Anesthesia Quick Evaluation

## 2023-10-15 ENCOUNTER — Encounter (HOSPITAL_COMMUNITY)
Admission: RE | Disposition: A | Discharge status: Home / Self Care | Payer: Self-pay | Source: Home / Self Care | Attending: Surgery

## 2023-10-15 ENCOUNTER — Encounter (HOSPITAL_COMMUNITY): Payer: Self-pay | Admitting: Surgery

## 2023-10-15 ENCOUNTER — Other Ambulatory Visit: Payer: Self-pay

## 2023-10-15 ENCOUNTER — Inpatient Hospital Stay (HOSPITAL_COMMUNITY): Admitting: Anesthesiology

## 2023-10-15 ENCOUNTER — Inpatient Hospital Stay (HOSPITAL_COMMUNITY)
Admission: RE | Admit: 2023-10-15 | Discharge: 2023-10-20 | DRG: 406 | Disposition: A | Discharge status: Home / Self Care | Attending: Surgery | Admitting: Surgery

## 2023-10-15 ENCOUNTER — Encounter (HOSPITAL_COMMUNITY): Admitting: Anesthesiology

## 2023-10-15 DIAGNOSIS — D62 Acute posthemorrhagic anemia: Secondary | ICD-10-CM | POA: Diagnosis not present

## 2023-10-15 DIAGNOSIS — Z9049 Acquired absence of other specified parts of digestive tract: Secondary | ICD-10-CM

## 2023-10-15 DIAGNOSIS — C189 Malignant neoplasm of colon, unspecified: Secondary | ICD-10-CM | POA: Diagnosis present

## 2023-10-15 DIAGNOSIS — C787 Secondary malignant neoplasm of liver and intrahepatic bile duct: Secondary | ICD-10-CM

## 2023-10-15 DIAGNOSIS — I10 Essential (primary) hypertension: Secondary | ICD-10-CM | POA: Diagnosis not present

## 2023-10-15 DIAGNOSIS — Z85048 Personal history of other malignant neoplasm of rectum, rectosigmoid junction, and anus: Secondary | ICD-10-CM | POA: Diagnosis not present

## 2023-10-15 DIAGNOSIS — E11628 Type 2 diabetes mellitus with other skin complications: Secondary | ICD-10-CM

## 2023-10-15 DIAGNOSIS — Z808 Family history of malignant neoplasm of other organs or systems: Secondary | ICD-10-CM

## 2023-10-15 DIAGNOSIS — Z6841 Body Mass Index (BMI) 40.0 and over, adult: Secondary | ICD-10-CM

## 2023-10-15 DIAGNOSIS — Z9221 Personal history of antineoplastic chemotherapy: Secondary | ICD-10-CM | POA: Diagnosis not present

## 2023-10-15 DIAGNOSIS — E1140 Type 2 diabetes mellitus with diabetic neuropathy, unspecified: Secondary | ICD-10-CM | POA: Diagnosis present

## 2023-10-15 DIAGNOSIS — Z8249 Family history of ischemic heart disease and other diseases of the circulatory system: Secondary | ICD-10-CM | POA: Diagnosis not present

## 2023-10-15 DIAGNOSIS — Z833 Family history of diabetes mellitus: Secondary | ICD-10-CM

## 2023-10-15 DIAGNOSIS — Z7984 Long term (current) use of oral hypoglycemic drugs: Secondary | ICD-10-CM | POA: Diagnosis not present

## 2023-10-15 DIAGNOSIS — E785 Hyperlipidemia, unspecified: Secondary | ICD-10-CM | POA: Diagnosis not present

## 2023-10-15 DIAGNOSIS — E66813 Obesity, class 3: Secondary | ICD-10-CM | POA: Diagnosis present

## 2023-10-15 DIAGNOSIS — C801 Malignant (primary) neoplasm, unspecified: Principal | ICD-10-CM | POA: Diagnosis present

## 2023-10-15 HISTORY — PX: OPERATIVE ULTRASOUND: SHX5996

## 2023-10-15 HISTORY — PX: OPEN PARTIAL HEPATECTOMY [83]: SHX5987

## 2023-10-15 HISTORY — PX: LAPAROSCOPY: SHX197

## 2023-10-15 LAB — POCT I-STAT 7, (LYTES, BLD GAS, ICA,H+H)
Acid-base deficit: 1 mmol/L (ref 0.0–2.0)
Acid-base deficit: 4 mmol/L — ABNORMAL HIGH (ref 0.0–2.0)
Bicarbonate: 24.1 mmol/L (ref 20.0–28.0)
Bicarbonate: 22.1 mmol/L (ref 20.0–28.0)
Calcium, Ion: 1.13 mmol/L — ABNORMAL LOW (ref 1.15–1.40)
Calcium, Ion: 1.19 mmol/L (ref 1.15–1.40)
HCT: 33 % — ABNORMAL LOW (ref 36.0–46.0)
HCT: 33 % — ABNORMAL LOW (ref 36.0–46.0)
Hemoglobin: 11.2 g/dL — ABNORMAL LOW (ref 12.0–15.0)
Hemoglobin: 11.2 g/dL — ABNORMAL LOW (ref 12.0–15.0)
O2 Saturation: 100 %
O2 Saturation: 100 %
Patient temperature: 35.5
Potassium: 4.1 mmol/L (ref 3.5–5.1)
Potassium: 4.2 mmol/L (ref 3.5–5.1)
Sodium: 138 mmol/L (ref 135–145)
Sodium: 138 mmol/L (ref 135–145)
TCO2: 25 mmol/L (ref 22–32)
TCO2: 23 mmol/L (ref 22–32)
pCO2 arterial: 36.6 mmHg (ref 32–48)
pCO2 arterial: 42.8 mmHg (ref 32–48)
pH, Arterial: 7.42 (ref 7.35–7.45)
pH, Arterial: 7.321 — ABNORMAL LOW (ref 7.35–7.45)
pO2, Arterial: 181 mmHg — ABNORMAL HIGH (ref 83–108)
pO2, Arterial: 278 mmHg — ABNORMAL HIGH (ref 83–108)

## 2023-10-15 LAB — PREPARE RBC (CROSSMATCH)

## 2023-10-15 LAB — CBC
HCT: 35.4 % — ABNORMAL LOW (ref 36.0–46.0)
Hemoglobin: 11.8 g/dL — ABNORMAL LOW (ref 12.0–15.0)
MCH: 27.7 pg (ref 26.0–34.0)
MCHC: 33.3 g/dL (ref 30.0–36.0)
MCV: 83.1 fL (ref 80.0–100.0)
Platelets: 296 K/uL (ref 150–400)
RBC: 4.26 MIL/uL (ref 3.87–5.11)
RDW: 15.5 % (ref 11.5–15.5)
WBC: 21.9 K/uL — ABNORMAL HIGH (ref 4.0–10.5)
nRBC: 0 % (ref 0.0–0.2)

## 2023-10-15 LAB — COMPREHENSIVE METABOLIC PANEL WITH GFR
ALT: 343 U/L — ABNORMAL HIGH (ref 0–44)
AST: 559 U/L — ABNORMAL HIGH (ref 15–41)
Albumin: 2.7 g/dL — ABNORMAL LOW (ref 3.5–5.0)
Alkaline Phosphatase: 54 U/L (ref 38–126)
Anion gap: 14 (ref 5–15)
BUN: 12 mg/dL (ref 6–20)
CO2: 21 mmol/L — ABNORMAL LOW (ref 22–32)
Calcium: 8.9 mg/dL (ref 8.9–10.3)
Chloride: 101 mmol/L (ref 98–111)
Creatinine, Ser: 0.89 mg/dL (ref 0.44–1.00)
GFR, Estimated: >60 mL/min (ref >60)
Glucose, Bld: 332 mg/dL — ABNORMAL HIGH (ref 70–99)
Potassium: 3.9 mmol/L (ref 3.5–5.1)
Sodium: 136 mmol/L (ref 135–145)
Total Bilirubin: 0.7 mg/dL (ref 0.0–1.2)
Total Protein: 5.7 g/dL — ABNORMAL LOW (ref 6.5–8.1)

## 2023-10-15 LAB — GLUCOSE, CAPILLARY
Glucose-Capillary: 120 mg/dL — ABNORMAL HIGH (ref 70–99)
Glucose-Capillary: 270 mg/dL — ABNORMAL HIGH (ref 70–99)
Glucose-Capillary: 311 mg/dL — ABNORMAL HIGH (ref 70–99)
Glucose-Capillary: 313 mg/dL — ABNORMAL HIGH (ref 70–99)

## 2023-10-15 LAB — MRSA NEXT GEN BY PCR, NASAL: MRSA by PCR Next Gen: NOT DETECTED

## 2023-10-15 SURGERY — HEPATECTOMY, PARTIAL, OPEN
Anesthesia: General | Site: Abdomen

## 2023-10-15 MED ORDER — ONDANSETRON HCL 4 MG/2ML IJ SOLN
INTRAMUSCULAR | Status: DC | PRN
  Administered 2023-10-15: 4 mg via INTRAVENOUS

## 2023-10-15 MED ORDER — SUGAMMADEX SODIUM 200 MG/2ML IV SOLN
INTRAVENOUS | Status: DC | PRN
  Administered 2023-10-15: 400 mg via INTRAVENOUS

## 2023-10-15 MED ORDER — PHENYLEPHRINE HCL-NACL 20-0.9 MG/250ML-% IV SOLN
INTRAVENOUS | Status: DC | PRN
  Administered 2023-10-15: 25 ug/min via INTRAVENOUS

## 2023-10-15 MED ORDER — ATORVASTATIN CALCIUM 10 MG PO TABS
20.0000 mg | ORAL_TABLET | Freq: Every day | ORAL | Status: DC
  Administered 2023-10-16 – 2023-10-20 (×5): 20 mg via ORAL
  Filled 2023-10-15 (×5): qty 2

## 2023-10-15 MED ORDER — 0.9 % SODIUM CHLORIDE (POUR BTL) OPTIME
TOPICAL | Status: DC | PRN
  Administered 2023-10-15: 1000 mL

## 2023-10-15 MED ORDER — CHLORHEXIDINE GLUCONATE 0.12 % MT SOLN
OROMUCOSAL | Status: AC
  Administered 2023-10-15: 15 mL via OROMUCOSAL
  Filled 2023-10-15: qty 15

## 2023-10-15 MED ORDER — PROPOFOL 10 MG/ML IV BOLUS
INTRAVENOUS | Status: AC
  Filled 2023-10-15: qty 20

## 2023-10-15 MED ORDER — GABAPENTIN 300 MG PO CAPS: 300.0000 mg | ORAL_CAPSULE | Freq: Once | ORAL | Status: AC

## 2023-10-15 MED ORDER — ACETAMINOPHEN 500 MG PO TABS
1000.0000 mg | ORAL_TABLET | Freq: Three times a day (TID) | ORAL | Status: DC
  Administered 2023-10-15 – 2023-10-20 (×15): 1000 mg via ORAL
  Filled 2023-10-15 (×15): qty 2

## 2023-10-15 MED ORDER — INSULIN ASPART 100 UNIT/ML IJ SOLN
0.0000 [IU] | INTRAMUSCULAR | Status: DC
  Administered 2023-10-15: 6 [IU] via SUBCUTANEOUS
  Administered 2023-10-15 – 2023-10-16 (×2): 15 [IU] via SUBCUTANEOUS
  Administered 2023-10-16: 7 [IU] via SUBCUTANEOUS
  Administered 2023-10-16: 4 [IU] via SUBCUTANEOUS

## 2023-10-15 MED ORDER — LIDOCAINE 2% (20 MG/ML) 5 ML SYRINGE
INTRAMUSCULAR | Status: DC | PRN
  Administered 2023-10-15: 80 mg via INTRAVENOUS

## 2023-10-15 MED ORDER — METHOCARBAMOL 1000 MG/10ML IJ SOLN
1000.0000 mg | Freq: Three times a day (TID) | INTRAMUSCULAR | Status: DC
  Administered 2023-10-15 – 2023-10-17 (×6): 1000 mg via INTRAVENOUS
  Filled 2023-10-15 (×6): qty 10

## 2023-10-15 MED ORDER — CALCIUM CHLORIDE 10 % IV SOLN
INTRAVENOUS | Status: DC | PRN
  Administered 2023-10-15 (×5): 200 mg via INTRAVENOUS

## 2023-10-15 MED ORDER — SODIUM CHLORIDE 0.9 % IR SOLN
Status: DC | PRN
  Administered 2023-10-15: 1000 mL

## 2023-10-15 MED ORDER — HEMOSTATIC AGENTS (NO CHARGE) OPTIME
TOPICAL | Status: DC | PRN
  Administered 2023-10-15 (×2): 1

## 2023-10-15 MED ORDER — HYDROMORPHONE HCL 1 MG/ML IJ SOLN
INTRAMUSCULAR | Status: DC | PRN
  Administered 2023-10-15: .5 mg via INTRAVENOUS

## 2023-10-15 MED ORDER — ACETAMINOPHEN 500 MG PO TABS: 1000.0000 mg | ORAL_TABLET | Freq: Once | ORAL | Status: AC

## 2023-10-15 MED ORDER — FENTANYL CITRATE (PF) 250 MCG/5ML IJ SOLN
INTRAMUSCULAR | Status: DC | PRN
  Administered 2023-10-15: 100 ug via INTRAVENOUS
  Administered 2023-10-15: 25 ug via INTRAVENOUS
  Administered 2023-10-15 (×2): 50 ug via INTRAVENOUS
  Administered 2023-10-15: 25 ug via INTRAVENOUS

## 2023-10-15 MED ORDER — ONDANSETRON HCL 4 MG/2ML IJ SOLN
INTRAMUSCULAR | Status: AC
  Filled 2023-10-15: qty 2

## 2023-10-15 MED ORDER — CHLORHEXIDINE GLUCONATE CLOTH 2 % EX PADS
6.0000 | MEDICATED_PAD | Freq: Every day | CUTANEOUS | Status: DC
  Administered 2023-10-15 – 2023-10-20 (×6): 6 via TOPICAL

## 2023-10-15 MED ORDER — MIDAZOLAM HCL 2 MG/2ML IJ SOLN
INTRAMUSCULAR | Status: AC
  Filled 2023-10-15: qty 2

## 2023-10-15 MED ORDER — FENTANYL CITRATE (PF) 250 MCG/5ML IJ SOLN
INTRAMUSCULAR | Status: AC
  Filled 2023-10-15: qty 5

## 2023-10-15 MED ORDER — SODIUM CHLORIDE 0.9% IV SOLUTION: Freq: Once | INTRAVENOUS | Status: AC

## 2023-10-15 MED ORDER — ENSURE PRE-SURGERY PO LIQD: 296.0000 mL | Freq: Once | ORAL | Status: DC

## 2023-10-15 MED ORDER — CEFAZOLIN SODIUM-DEXTROSE 3-4 GM/150ML-% IV SOLN
3.0000 g | INTRAVENOUS | Status: AC
  Administered 2023-10-15: 3 g via INTRAVENOUS

## 2023-10-15 MED ORDER — HYDROMORPHONE HCL 1 MG/ML IJ SOLN
INTRAMUSCULAR | Status: AC
Start: 2023-10-15 — End: 2023-10-15
  Filled 2023-10-15: qty 0.5

## 2023-10-15 MED ORDER — DIPHENHYDRAMINE HCL 50 MG/ML IJ SOLN: 25.0000 mg | Freq: Four times a day (QID) | INTRAMUSCULAR | Status: DC | PRN

## 2023-10-15 MED ORDER — HYDROMORPHONE 1 MG/ML IV SOLN
INTRAVENOUS | Status: DC
  Administered 2023-10-15: 30 mg via INTRAVENOUS
  Administered 2023-10-15: 1 mg via INTRAVENOUS
  Administered 2023-10-16: 0.3 mg via INTRAVENOUS

## 2023-10-15 MED ORDER — DEXAMETHASONE SODIUM PHOSPHATE 10 MG/ML IJ SOLN
INTRAMUSCULAR | Status: AC
  Filled 2023-10-15: qty 1

## 2023-10-15 MED ORDER — CHLORHEXIDINE GLUCONATE 0.12 % MT SOLN: 15.0000 mL | Freq: Once | OROMUCOSAL | Status: AC

## 2023-10-15 MED ORDER — BUPIVACAINE HCL (PF) 0.25 % IJ SOLN
INTRAMUSCULAR | Status: AC
  Filled 2023-10-15: qty 30

## 2023-10-15 MED ORDER — KETOROLAC TROMETHAMINE 15 MG/ML IJ SOLN
15.0000 mg | Freq: Three times a day (TID) | INTRAMUSCULAR | Status: DC
  Administered 2023-10-15 – 2023-10-16 (×3): 15 mg via INTRAVENOUS
  Filled 2023-10-15 (×3): qty 1

## 2023-10-15 MED ORDER — ONDANSETRON 4 MG PO TBDP: 4.0000 mg | ORAL_TABLET | Freq: Four times a day (QID) | ORAL | Status: DC | PRN

## 2023-10-15 MED ORDER — ONDANSETRON HCL 4 MG/2ML IJ SOLN: 4.0000 mg | Freq: Four times a day (QID) | INTRAMUSCULAR | Status: DC | PRN

## 2023-10-15 MED ORDER — SODIUM CHLORIDE 0.9% IV SOLUTION: Freq: Once | INTRAVENOUS | Status: DC

## 2023-10-15 MED ORDER — CEFAZOLIN SODIUM-DEXTROSE 3-4 GM/150ML-% IV SOLN
INTRAVENOUS | Status: AC
  Filled 2023-10-15: qty 150

## 2023-10-15 MED ORDER — SODIUM CHLORIDE 0.9 % IV SOLN: INTRAVENOUS | Status: DC | PRN

## 2023-10-15 MED ORDER — DROPERIDOL 2.5 MG/ML IJ SOLN: 0.6250 mg | Freq: Once | INTRAMUSCULAR | Status: DC | PRN

## 2023-10-15 MED ORDER — DEXAMETHASONE SODIUM PHOSPHATE 10 MG/ML IJ SOLN
INTRAMUSCULAR | Status: DC | PRN
  Administered 2023-10-15: 5 mg via INTRAVENOUS

## 2023-10-15 MED ORDER — ROCURONIUM BROMIDE 10 MG/ML (PF) SYRINGE
PREFILLED_SYRINGE | INTRAVENOUS | Status: AC
  Filled 2023-10-15: qty 10

## 2023-10-15 MED ORDER — POLYETHYLENE GLYCOL 3350 17 G PO PACK: 17.0000 g | PACK | Freq: Every day | ORAL | Status: DC | PRN

## 2023-10-15 MED ORDER — ROCURONIUM BROMIDE 10 MG/ML (PF) SYRINGE
PREFILLED_SYRINGE | INTRAVENOUS | Status: DC | PRN
  Administered 2023-10-15 (×2): 20 mg via INTRAVENOUS
  Administered 2023-10-15: 100 mg via INTRAVENOUS
  Administered 2023-10-15: 20 mg via INTRAVENOUS

## 2023-10-15 MED ORDER — LACTATED RINGERS IV SOLN
INTRAVENOUS | Status: DC
Start: 2023-10-15 — End: 2023-10-17

## 2023-10-15 MED ORDER — ORAL CARE MOUTH RINSE: 15.0000 mL | OROMUCOSAL | Status: DC | PRN

## 2023-10-15 MED ORDER — ORAL CARE MOUTH RINSE: 15.0000 mL | Freq: Once | OROMUCOSAL | Status: AC

## 2023-10-15 MED ORDER — DEXMEDETOMIDINE HCL IN NACL 80 MCG/20ML IV SOLN
INTRAVENOUS | Status: DC | PRN
  Administered 2023-10-15: 8 ug via INTRAVENOUS

## 2023-10-15 MED ORDER — GABAPENTIN 300 MG PO CAPS
ORAL_CAPSULE | ORAL | Status: AC
  Administered 2023-10-15: 300 mg via ORAL
  Filled 2023-10-15: qty 1

## 2023-10-15 MED ORDER — HYDROMORPHONE HCL 1 MG/ML IJ SOLN
INTRAMUSCULAR | Status: AC
  Filled 2023-10-15: qty 1

## 2023-10-15 MED ORDER — LIDOCAINE 2% (20 MG/ML) 5 ML SYRINGE
INTRAMUSCULAR | Status: AC
  Filled 2023-10-15: qty 5

## 2023-10-15 MED ORDER — HEPARIN SODIUM (PORCINE) 5000 UNIT/ML IJ SOLN
5000.0000 [IU] | Freq: Three times a day (TID) | INTRAMUSCULAR | Status: DC
  Administered 2023-10-16: 5000 [IU] via SUBCUTANEOUS
  Filled 2023-10-15: qty 1

## 2023-10-15 MED ORDER — PHENYLEPHRINE 80 MCG/ML (10ML) SYRINGE FOR IV PUSH (FOR BLOOD PRESSURE SUPPORT)
PREFILLED_SYRINGE | INTRAVENOUS | Status: DC | PRN
  Administered 2023-10-15 (×3): 160 ug via INTRAVENOUS
  Administered 2023-10-15: 80 ug via INTRAVENOUS

## 2023-10-15 MED ORDER — HYDROMORPHONE HCL 1 MG/ML IJ SOLN
0.2500 mg | INTRAMUSCULAR | Status: DC | PRN
  Administered 2023-10-15: 0.5 mg via INTRAVENOUS

## 2023-10-15 MED ORDER — DIPHENHYDRAMINE HCL 25 MG PO CAPS: 25.0000 mg | ORAL_CAPSULE | Freq: Four times a day (QID) | ORAL | Status: DC | PRN

## 2023-10-15 MED ORDER — INSULIN ASPART 100 UNIT/ML IJ SOLN
5.0000 [IU] | Freq: Once | INTRAMUSCULAR | Status: AC
  Administered 2023-10-15: 5 [IU] via SUBCUTANEOUS

## 2023-10-15 MED ORDER — ENSURE PRE-SURGERY PO LIQD: 592.0000 mL | Freq: Once | ORAL | Status: DC

## 2023-10-15 MED ORDER — MIDAZOLAM HCL 2 MG/2ML IJ SOLN
INTRAMUSCULAR | Status: DC | PRN
  Administered 2023-10-15 (×2): 1 mg via INTRAVENOUS

## 2023-10-15 MED ORDER — ACETAMINOPHEN 500 MG PO TABS
ORAL_TABLET | ORAL | Status: AC
  Administered 2023-10-15: 1000 mg via ORAL
  Filled 2023-10-15: qty 2

## 2023-10-15 MED ORDER — HYDROMORPHONE 1 MG/ML IV SOLN
INTRAVENOUS | Status: AC
  Filled 2023-10-15: qty 30

## 2023-10-15 MED ORDER — NALOXONE HCL 0.4 MG/ML IJ SOLN: 0.4000 mg | INTRAMUSCULAR | Status: DC | PRN

## 2023-10-15 MED ORDER — LACTATED RINGERS IV SOLN: INTRAVENOUS | Status: DC | PRN

## 2023-10-15 MED ORDER — PROPOFOL 10 MG/ML IV BOLUS
INTRAVENOUS | Status: DC | PRN
  Administered 2023-10-15: 200 mg via INTRAVENOUS

## 2023-10-15 MED ORDER — DOCUSATE SODIUM 100 MG PO CAPS
100.0000 mg | ORAL_CAPSULE | Freq: Two times a day (BID) | ORAL | Status: DC
  Administered 2023-10-15 – 2023-10-20 (×10): 100 mg via ORAL
  Filled 2023-10-15 (×10): qty 1

## 2023-10-15 MED ORDER — LACTATED RINGERS IV SOLN: INTRAVENOUS | Status: DC

## 2023-10-15 MED ORDER — SODIUM CHLORIDE 0.9% FLUSH: 9.0000 mL | INTRAVENOUS | Status: DC | PRN

## 2023-10-15 SURGICAL SUPPLY — 66 items
BAG COUNTER SPONGE SURGICOUNT (BAG) ×1 IMPLANT
BIOPATCH RED 1 DISK 7.0 (GAUZE/BANDAGES/DRESSINGS) ×2 IMPLANT
CANISTER SUCTION 3000ML PPV (SUCTIONS) ×1 IMPLANT
CHLORAPREP W/TINT 26 (MISCELLANEOUS) ×1 IMPLANT
CLAMP SUTURE YELLOW 5 PAIRS (MISCELLANEOUS) IMPLANT
CLIP TI LARGE 6 (CLIP) IMPLANT
CLIP TI MEDIUM 24 (CLIP) ×1 IMPLANT
CLIP TI WIDE RED SMALL 24 (CLIP) ×1 IMPLANT
CNTNR URN SCR LID CUP LEK RST (MISCELLANEOUS) IMPLANT
COVER SURGICAL LIGHT HANDLE (MISCELLANEOUS) ×1 IMPLANT
DERMABOND ADVANCED .7 DNX12 (GAUZE/BANDAGES/DRESSINGS) ×2 IMPLANT
DRAIN CHANNEL 19F RND (DRAIN) ×1 IMPLANT
DRAPE INCISE IOBAN 66X45 STRL (DRAPES) ×1 IMPLANT
DRAPE LAPAROSCOPIC ABDOMINAL (DRAPES) ×1 IMPLANT
DRAPE WARM FLUID 44X44 (DRAPES) ×1 IMPLANT
DRSG TEGADERM 4X4.75 (GAUZE/BANDAGES/DRESSINGS) ×2 IMPLANT
ELECT BLADE 6.5 EXT (BLADE) ×1 IMPLANT
ELECT CAUTERY BLADE 6.4 (BLADE) ×1 IMPLANT
ELECT PAD DSPR THERM+ ADLT (MISCELLANEOUS) ×1 IMPLANT
ELECTRODE REM PT RTRN 9FT ADLT (ELECTROSURGICAL) ×1 IMPLANT
EVACUATOR SILICONE 100CC (DRAIN) ×1 IMPLANT
GLOVE BIOGEL PI IND STRL 6 (GLOVE) ×1 IMPLANT
GLOVE BIOGEL PI MICRO STRL 5.5 (GLOVE) ×2 IMPLANT
GOWN STRL REUS W/ TWL LRG LVL3 (GOWN DISPOSABLE) ×2 IMPLANT
HAND PENCIL TRP OPTION (MISCELLANEOUS) ×1 IMPLANT
HANDLE SUCTION POOLE (INSTRUMENTS) ×1 IMPLANT
HEMOSTAT SNOW SURGICEL 2X4 (HEMOSTASIS) ×1 IMPLANT
KIT BASIN OR (CUSTOM PROCEDURE TRAY) ×1 IMPLANT
KIT TURNOVER KIT B (KITS) ×1 IMPLANT
LIGASURE IMPACT 36 18CM CVD LR (INSTRUMENTS) IMPLANT
NDL INSUFFLATION 14GA 120MM (NEEDLE) ×1 IMPLANT
NEEDLE INSUFFLATION 14GA 120MM (NEEDLE) ×1 IMPLANT
NS IRRIG 1000ML POUR BTL (IV SOLUTION) ×2 IMPLANT
PACK GENERAL/GYN (CUSTOM PROCEDURE TRAY) ×1 IMPLANT
PAD ARMBOARD POSITIONER FOAM (MISCELLANEOUS) ×2 IMPLANT
PENCIL SMOKE EVACUATOR (MISCELLANEOUS) ×1 IMPLANT
RELOAD STAPLE 60 2.6 WHT THN (STAPLE) IMPLANT
RELOAD STAPLER WHITE 60MM (STAPLE) ×5 IMPLANT
RETAINER VISCERA MED (MISCELLANEOUS) IMPLANT
SEALANT PATCH FIBRIN 2X4IN (MISCELLANEOUS) IMPLANT
SEALER BIPOLAR AQUA 6.0 (INSTRUMENTS) ×1 IMPLANT
SET TUBE SMOKE EVAC HIGH FLOW (TUBING) ×1 IMPLANT
SLEEVE Z-THREAD 5X100MM (TROCAR) ×1 IMPLANT
SPONGE T-LAP 18X18 ~~LOC~~+RFID (SPONGE) ×1 IMPLANT
STAPLER POWER ECHELON 60 WIDE (STAPLE) IMPLANT
SUT CHROMIC 0 BP (SUTURE) ×2 IMPLANT
SUT CHROMIC 3 0 CT 36 (SUTURE) IMPLANT
SUT ETHILON 2 0 FS 18 (SUTURE) ×1 IMPLANT
SUT MNCRL AB 4-0 PS2 18 (SUTURE) ×1 IMPLANT
SUT PDS AB 1 TP1 96 (SUTURE) ×2 IMPLANT
SUT PROLENE 3 0 SH 48 (SUTURE) ×1 IMPLANT
SUT PROLENE 4-0 RB1 .5 CRCL 36 (SUTURE) ×2 IMPLANT
SUT SILK 2 0 TIES 10X30 (SUTURE) ×1 IMPLANT
SUT SILK 2 0SH CR/8 30 (SUTURE) ×1 IMPLANT
SUT SILK 3 0 TIES 10X30 (SUTURE) IMPLANT
SUT SILK 3 0SH CR/8 30 (SUTURE) IMPLANT
SUT VIC AB 3-0 SH 27X BRD (SUTURE) ×1 IMPLANT
SYR BULB IRRIG 60ML STRL (SYRINGE) ×1 IMPLANT
TOWEL GREEN STERILE (TOWEL DISPOSABLE) ×1 IMPLANT
TOWEL GREEN STERILE FF (TOWEL DISPOSABLE) ×1 IMPLANT
TRAY FOLEY MTR SLVR 14FR STAT (SET/KITS/TRAYS/PACK) ×1 IMPLANT
TRAY LAPAROSCOPIC MC (CUSTOM PROCEDURE TRAY) ×1 IMPLANT
TROCAR Z-THREAD OPTICAL 5X100M (TROCAR) ×1 IMPLANT
TUBE CONNECTING 12X1/4 (SUCTIONS) IMPLANT
WARMER LAPAROSCOPE (MISCELLANEOUS) ×1 IMPLANT
YANKAUER SUCT BULB TIP NO VENT (SUCTIONS) IMPLANT

## 2023-10-15 NOTE — Anesthesia Postprocedure Evaluation (Signed)
 Anesthesia Post Note  Patient: Tammy Marks  Procedure(s) Performed: HEPATECTOMY, PARTIAL, OPEN (Abdomen) LAPAROSCOPY, DIAGNOSTIC (Abdomen) US  INTRAOPERATIVE (Abdomen)     Patient location during evaluation: PACU Anesthesia Type: General Level of consciousness: awake and alert Pain management: pain level controlled Vital Signs Assessment: post-procedure vital signs reviewed and stable Respiratory status: spontaneous breathing, nonlabored ventilation, respiratory function stable and patient connected to nasal cannula oxygen Cardiovascular status: blood pressure returned to baseline and stable Postop Assessment: no apparent nausea or vomiting Anesthetic complications: no   No notable events documented.  Last Vitals:  Vitals:   10/15/23 1514 10/15/23 1515  BP:  134/81  Pulse:  77  Resp: 19 18  Temp:  36.5 C  SpO2: 100% 100%    Last Pain:  Vitals:   10/15/23 1515  TempSrc:   PainSc: Asleep                 Thom JONELLE Peoples

## 2023-10-15 NOTE — Plan of Care (Signed)
  Problem: Clinical Measurements: Goal: Diagnostic test results will improve Outcome: Progressing   Problem: Nutrition: Goal: Adequate nutrition will be maintained Outcome: Progressing   Problem: Coping: Goal: Level of anxiety will decrease Outcome: Progressing   Problem: Elimination: Goal: Will not experience complications related to urinary retention Outcome: Progressing   Problem: Pain Managment: Goal: General experience of comfort will improve and/or be controlled Outcome: Progressing   Problem: Safety: Goal: Ability to remain free from injury will improve Outcome: Progressing   Problem: Skin Integrity: Goal: Risk for impaired skin integrity will decrease Outcome: Progressing

## 2023-10-15 NOTE — Op Note (Signed)
 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 She completed 8 cycles FOLFOX, followed by a left colectomy in January of this year. She then underwent ablation of  the segment 7 lesion in February. At that time the segment 8 lesion was not radiographically evident on CT or ultrasound. She was treated with adjuvant Xeloda , and had increase in size of the segment 7 lesion on follow up imaging, with PET avidity of both the segment 7 and segment 8 lesions. Her CEA remains normal. After a discussion of the risks and benefits of surgery, she agreed to proceed with resection of the liver lesions.  Findings: Malignant mass in segments 8 and 7, each resected via nonanatomic wedge resection. No evidence of metastatic disease within the abdomen.  Procedure details: Informed consent was obtained in the preoperative area prior to the procedure. The patient was brought to the operating room and placed on the table in the supine position. General anesthesia was induced and appropriate lines and drains were placed for intraoperative monitoring. Perioperative antibiotics were administered per SCIP guidelines. The abdomen was prepped and draped in the usual sterile fashion. A pre-procedure timeout was taken verifying patient identity, surgical site and procedure to be performed.  A small skin incision was made in the right upper  quadrant, the fascia was grasped and elevated, and a Veress needle was inserted through the fascia.  Intraperitoneal placement was confirmed with the saline drop test, the abdomen was insufflated, and a 5 mm Visiport was placed.  The peritoneal cavity was inspected with no evidence of visceral or vascular injury.  There was a malignant appearing lesion on the surface of the dome of the liver, corresponding with preoperative imaging.  There were no other surface nodules on the liver.  There were no peritoneal nodules and no nodules on the diaphragm.  The port was removed and the abdomen was desufflated.  A right subcostal skin incision was made and was extended vertically onto the upper midline.  The subcutaneous tissue was divided with cautery to expose the fascia.  The fascia and muscle were divided and the peritoneum was opened.  At midline the fascia was opened along the linea alba.  The falciform ligament was divided with LigaSure and taken down off the abdominal wall with cautery.  A Thompson retractor was placed.  The right triangular ligament was divided with cautery to mobilize the right lobe of the liver.  Intraoperative ultrasound was then performed.  The surface lesion on segment 8 was approximately 1 cm in depth and 2 cm in diameter.  The known metastatic lesion in the posterior of segment 7 was palpable and was also visualized on ultrasound.  It was adjacent to a right hepatic vein branch.  No other suspicious lesions were identified on ultrasound.  The right lobe of the liver was manually rotated medially.  Ultrasound was used to mark out margins around the mass on the surface of the liver.  A  wedge resection of the mass was then performed.  The liver parenchyma was divided at the surface using LigaSure.  The deeper parenchyma was divided using a crush-clamp technique.  Aquamantys was used to help maintain hemostasis.  The segment 7 portal pedicle branch was encountered and divided with an Echelon  stapler using a white load.  Distal hepatic vein branches were also divided using the stapler.  Once the specimen was completely excised it was examined with ultrasound and appeared to contain the entire lesion.  The lesion appeared to abut the deeper margin of the resected specimen but there did not appear to be violation of the tumor capsule.  However an additional deeper margin of liver tissue was taken using Aquamantys to ensure negative margins.  There was some bleeding from a hepatic vein branch.  This was partially controlled with a 3-0 Prolene figure-of-eight suture.  On examination it appeared there was a small hole in the side of the vein branch which was bleeding.  Additional sutures were placed but it was difficult to control.  Surgicel snow was applied and then manual pressure was applied with laparotomy pads for 5 minutes.  This area was then packed with additional lap pads to maintain pressure while we resected the segment 8 lesion.  A grossly negative margin of tissue was marked on the surface of the liver around the segment 8 lesion using cautery.  The parenchyma was then divided with Aquqamantys.  Small branch vessels were clipped prior to division.  The specimen was completely excised and examined.  The tumor appeared to have been completely excised.  An additional deep margin of liver tissue was taken using Aquamantys and sent for routine pathology.  Hemostasis was achieved at the resection site using Aquamantys and argon.  The segment 7 resection site was then again examined and the laparotomy pads were removed.  The site appeared completely hemostatic.  A Valsalva maneuver was performed and a clean lap pad was applied to the resection bed.  There was no evidence of bile leak or ongoing bleeding.  A sheet of Everest was placed on the resection bed at segment 7.  The abdomen was irrigated and appeared hemostatic.  The retractors were removed.  The fascia was closed in 2 layers using running 1 PDS  suture.  Scarpa's layer was closed with running 3-0 Vicryl suture, and the skin was closed with running subcuticular 4-0 Monocryl suture.  Dermabond was applied.  The patient tolerated the procedure with no apparent complications. All counts were correct x2 at the end of the procedure. The patient was extubated and taken to PACU in stable condition.  Intraoperative US :  Segment 7 mass   Segment 8 mass   Leonor Dawn, MD 10/15/23 2:54 PM

## 2023-10-15 NOTE — Anesthesia Procedure Notes (Signed)
 Procedure Name: Intubation Date/Time: 10/15/2023 11:33 AM  Performed by: Carolee Lauraine DASEN, CRNAPre-anesthesia Checklist: Patient identified, Emergency Drugs available, Suction available and Patient being monitored Patient Re-evaluated:Patient Re-evaluated prior to induction Oxygen Delivery Method: Circle System Utilized Preoxygenation: Pre-oxygenation with 100% oxygen Induction Type: IV induction and Cricoid Pressure applied Ventilation: Mask ventilation without difficulty Laryngoscope Size: Mac and 3 Grade View: Grade II Tube type: Oral Tube size: 7.0 mm Number of attempts: 1 Airway Equipment and Method: Stylet and Oral airway Placement Confirmation: ETT inserted through vocal cords under direct vision, positive ETCO2 and breath sounds checked- equal and bilateral Secured at: 21 cm Tube secured with: Tape Dental Injury: Teeth and Oropharynx as per pre-operative assessment

## 2023-10-15 NOTE — Interval H&P Note (Signed)
 History and Physical Interval Note:  10/15/2023 10:32 AM  Tammy Marks  has presented today for surgery, with the diagnosis of METASTATIC COLON CANCER.  The various methods of treatment have been discussed with the patient and family. After consideration of risks, benefits and other options for treatment, the patient has consented to  Procedure(s) with comments: HEPATECTOMY, PARTIAL, OPEN (N/A) - OPEN PARTIAL RIGHT HEPATECTOMY LAPAROSCOPY, DIAGNOSTIC (N/A) - STAGING LAPAROSCOPY WITH INTRAOPERATIVE ULTRASOUND US  INTRAOPERATIVE (N/A) as a surgical intervention.  The patient's history has been reviewed, patient examined, no change in status, stable for surgery.  I have reviewed the patient's chart and labs.  Questions were answered to the patient's satisfaction. Case reviewed at [facility] last week. PET shows some avidity at colonic anastomosis, likely postop, but no other extrahepatic disease. Patient saw GI yesterday, planning for flex sig to evaluate. As there is no other evidence of extrahepatic disease, will proceed with liver resection. Patient will be admitted postoperatively.   Leonor LITTIE Dawn

## 2023-10-15 NOTE — Anesthesia Procedure Notes (Signed)
 Arterial Line Insertion Start/End7/11/2023 11:30 AM, 10/15/2023 11:35 AM Performed by: Carolee Lauraine DASEN, CRNA, CRNA  Patient location: Pre-op. Preanesthetic checklist: patient identified, IV checked, site marked, risks and benefits discussed, surgical consent, monitors and equipment checked, pre-op evaluation and anesthesia consent Lidocaine  1% used for infiltration Left, radial was placed Catheter size: 20 G Hand hygiene performed  and maximum sterile barriers used   Attempts: 1 Procedure performed without using ultrasound guided technique. Following insertion, dressing applied and Biopatch. Post procedure assessment: normal and unchanged  Patient tolerated the procedure well with no immediate complications.

## 2023-10-15 NOTE — Transfer of Care (Signed)
 Immediate Anesthesia Transfer of Care Note  Patient: Tammy Marks  Procedure(s) Performed: HEPATECTOMY, PARTIAL, OPEN (Abdomen) LAPAROSCOPY, DIAGNOSTIC (Abdomen) US  INTRAOPERATIVE (Abdomen)  Patient Location: PACU  Anesthesia Type:General  Level of Consciousness: awake, alert , oriented, and patient cooperative  Airway & Oxygen Therapy: Patient Spontanous Breathing and Patient connected to face mask oxygen  Post-op Assessment: Report given to RN, Post -op Vital signs reviewed and stable, Patient moving all extremities, and Patient moving all extremities X 4  Post vital signs: Reviewed and stable  Last Vitals:  Vitals Value Taken Time  BP 150/81 10/15/23 14:45  Temp    Pulse 77 10/15/23 14:52  Resp 20 10/15/23 14:52  SpO2 98 % 10/15/23 14:52  Vitals shown include unfiled device data.  Last Pain:  Vitals:   10/15/23 1048  TempSrc:   PainSc: 0-No pain         Complications: No notable events documented.

## 2023-10-16 ENCOUNTER — Encounter (HOSPITAL_COMMUNITY): Payer: Self-pay | Admitting: Surgery

## 2023-10-16 LAB — PREPARE FRESH FROZEN PLASMA: Unit division: 0

## 2023-10-16 LAB — BPAM FFP
Blood Product Expiration Date: 202507122359
Blood Product Expiration Date: 202507122359
Blood Product Expiration Date: 202507132359
Blood Product Expiration Date: 202507132359
ISSUE DATE / TIME: 202507081416
ISSUE DATE / TIME: 202507081416
ISSUE DATE / TIME: 202507081416
ISSUE DATE / TIME: 202507081416
Unit Type and Rh: 6200
Unit Type and Rh: 6200
Unit Type and Rh: 6200
Unit Type and Rh: 6200

## 2023-10-16 LAB — CBC
HCT: 31 % — ABNORMAL LOW (ref 36.0–46.0)
Hemoglobin: 10.3 g/dL — ABNORMAL LOW (ref 12.0–15.0)
MCH: 27.5 pg (ref 26.0–34.0)
MCHC: 33.2 g/dL (ref 30.0–36.0)
MCV: 82.7 fL (ref 80.0–100.0)
Platelets: 244 K/uL (ref 150–400)
RBC: 3.75 MIL/uL — ABNORMAL LOW (ref 3.87–5.11)
RDW: 15.8 % — ABNORMAL HIGH (ref 11.5–15.5)
WBC: 11.8 K/uL — ABNORMAL HIGH (ref 4.0–10.5)
nRBC: 0 % (ref 0.0–0.2)

## 2023-10-16 LAB — GLUCOSE, CAPILLARY
Glucose-Capillary: 285 mg/dL — ABNORMAL HIGH (ref 70–99)
Glucose-Capillary: 222 mg/dL — ABNORMAL HIGH (ref 70–99)
Glucose-Capillary: 193 mg/dL — ABNORMAL HIGH (ref 70–99)
Glucose-Capillary: 159 mg/dL — ABNORMAL HIGH (ref 70–99)
Glucose-Capillary: 184 mg/dL — ABNORMAL HIGH (ref 70–99)
Glucose-Capillary: 211 mg/dL — ABNORMAL HIGH (ref 70–99)

## 2023-10-16 LAB — COMPREHENSIVE METABOLIC PANEL WITH GFR
ALT: 245 U/L — ABNORMAL HIGH (ref 0–44)
AST: 312 U/L — ABNORMAL HIGH (ref 15–41)
Albumin: 2.5 g/dL — ABNORMAL LOW (ref 3.5–5.0)
Alkaline Phosphatase: 51 U/L (ref 38–126)
Anion gap: 10 (ref 5–15)
BUN: 16 mg/dL (ref 6–20)
CO2: 21 mmol/L — ABNORMAL LOW (ref 22–32)
Calcium: 7.9 mg/dL — ABNORMAL LOW (ref 8.9–10.3)
Chloride: 102 mmol/L (ref 98–111)
Creatinine, Ser: 1.11 mg/dL — ABNORMAL HIGH (ref 0.44–1.00)
GFR, Estimated: 59 mL/min — ABNORMAL LOW (ref >60)
Glucose, Bld: 207 mg/dL — ABNORMAL HIGH (ref 70–99)
Potassium: 4.7 mmol/L (ref 3.5–5.1)
Sodium: 133 mmol/L — ABNORMAL LOW (ref 135–145)
Total Bilirubin: 0.5 mg/dL (ref 0.0–1.2)
Total Protein: 5.4 g/dL — ABNORMAL LOW (ref 6.5–8.1)

## 2023-10-16 MED ORDER — INSULIN ASPART 100 UNIT/ML IJ SOLN
0.0000 [IU] | Freq: Every day | INTRAMUSCULAR | Status: DC
  Administered 2023-10-16 – 2023-10-17 (×2): 2 [IU] via SUBCUTANEOUS
  Administered 2023-10-18: 3 [IU] via SUBCUTANEOUS

## 2023-10-16 MED ORDER — INSULIN ASPART 100 UNIT/ML IJ SOLN
0.0000 [IU] | Freq: Three times a day (TID) | INTRAMUSCULAR | Status: DC
  Administered 2023-10-16: 4 [IU] via SUBCUTANEOUS
  Administered 2023-10-17: 7 [IU] via SUBCUTANEOUS
  Administered 2023-10-17: 11 [IU] via SUBCUTANEOUS
  Administered 2023-10-18: 4 [IU] via SUBCUTANEOUS
  Administered 2023-10-18 (×2): 7 [IU] via SUBCUTANEOUS
  Administered 2023-10-19: 11 [IU] via SUBCUTANEOUS
  Administered 2023-10-19: 4 [IU] via SUBCUTANEOUS
  Administered 2023-10-19: 7 [IU] via SUBCUTANEOUS
  Administered 2023-10-20: 4 [IU] via SUBCUTANEOUS

## 2023-10-16 MED ORDER — OXYCODONE HCL 5 MG PO TABS
5.0000 mg | ORAL_TABLET | ORAL | Status: DC | PRN
  Administered 2023-10-16 – 2023-10-17 (×2): 5 mg via ORAL
  Administered 2023-10-18 – 2023-10-20 (×5): 10 mg via ORAL
  Filled 2023-10-16 (×3): qty 2
  Filled 2023-10-16 (×2): qty 1
  Filled 2023-10-16 (×2): qty 2

## 2023-10-16 MED ORDER — HYDROMORPHONE HCL 1 MG/ML IJ SOLN
0.5000 mg | INTRAMUSCULAR | Status: DC | PRN
  Administered 2023-10-16 – 2023-10-18 (×7): 0.5 mg via INTRAVENOUS
  Filled 2023-10-16 (×7): qty 0.5

## 2023-10-16 MED ORDER — ENOXAPARIN SODIUM 40 MG/0.4ML IJ SOSY
40.0000 mg | PREFILLED_SYRINGE | INTRAMUSCULAR | Status: DC
  Administered 2023-10-16 – 2023-10-19 (×4): 40 mg via SUBCUTANEOUS
  Filled 2023-10-16 (×4): qty 0.4

## 2023-10-16 NOTE — Evaluation (Signed)
 Physical Therapy Evaluation Patient Details Name: Tammy Marks MRN: [id number] DOB: Aug 26, 1965 Today's Date: 10/16/2023  History of Present Illness  Pt is a 58 y.o. female who presented 10/15/23 for staging laparoscopy, intraoperative liver ultrasound, and open R hepatic metastasectomy x2 secondary to colon cancer with metastatic disease to liver. PMH: anemia, colon cancer with chemotherapy, DM   Clinical Impression  Pt presents with condition above and deficits mentioned below, see PT Problem List. 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 Currently, the pt demonstrates deficits in balance and activity tolerance, primarily limited by abdominal pain. She was able to perform all functional mobility at a CGA level without any DME this date, but she did intermittently reach out to hold onto furniture when initially ambulating. She will likely progress well as her pain improves and thereby will likely not need any post acute PT at d/c. Will continue to follow acutely to maximize her return to baseline prior to d/c home.        If plan is discharge home, recommend the following: A little help with bathing/dressing/bathroom;Assistance with cooking/housework   Can travel by private vehicle        Equipment Recommendations None recommended by PT  Recommendations for Other Services  OT consult    Functional Status Assessment Patient has had a recent decline in their functional status and demonstrates the ability to make significant improvements in function in a reasonable and predictable amount of time.     Precautions / Restrictions Precautions Precautions: Fall;Other (comment) Precaution/Restrictions Comments: abdominal precautions Restrictions Weight Bearing Restrictions Per Provider Order: No      Mobility  Bed Mobility Overal bed mobility: Needs Assistance Bed Mobility: Rolling, Sidelying to Sit Rolling:  Contact guard assist Sidelying to sit: Contact guard assist, HOB elevated       General bed mobility comments: Cues provided to roll then push up on arms to sit up R EOB to reduce abdominal pain, extra time to complete but good compliance noted, CGA for safety    Transfers Overall transfer level: Needs assistance Equipment used: None Transfers: Sit to/from Stand Sit to Stand: Contact guard assist           General transfer comment: CGA for safety transferring to stand from EOB with pt intermittently holding onto furniture when powering up to stand and gain balance, extra time to complete    Ambulation/Gait Ambulation/Gait assistance: Contact guard assist Gait Distance (Feet): 200 Feet Assistive device: None Gait Pattern/deviations: Step-through pattern, Decreased stride length, Trunk flexed Gait velocity: reduced Gait velocity interpretation: <1.8 ft/sec, indicate of risk for recurrent falls   General Gait Details: Pt takes slightly slowed, short steps and maintains a slightly flexed posture, guarding her abdomen. No LOB noted, even with changing head positions. CGA for safety  Stairs            Wheelchair Mobility     Tilt Bed    Modified Rankin (Stroke Patients Only)       Balance Overall balance assessment: Mild deficits observed, not formally tested                                           Pertinent Vitals/Pain Pain Assessment Pain Assessment: 0-10 Pain Score: 4  (at rest) Pain Location: abdomen Pain Descriptors / Indicators: Discomfort, Grimacing,  Guarding, Operative site guarding Pain Intervention(s): Limited activity within patient's tolerance, Monitored during session, Repositioned    Home Living Family/patient expects to be discharged to:: Private residence Living Arrangements: Alone Available Help at Discharge:  (None per pt) Type of Home: Apartment Home Access: Level entry       Home Layout: One level Home Equipment:  Agricultural consultant (2 wheels)      Prior Function Prior Level of Function : Independent/Modified Independent;Working/employed;Driving             Mobility Comments: No AD 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[employer]     Extremity/Trunk Assessment   Upper Extremity Assessment Upper Extremity Assessment: Right hand dominant;Overall [facility] for tasks assessed;Defer to OT evaluation    Lower Extremity Assessment Lower Extremity Assessment: RLE deficits/detail;LLE deficits/detail RLE Deficits / Details: overall WFL strength, reports hx of numbness/tingling that began a few months ago in plantar aspects of feet and toes bil LLE Deficits / Details: overall WFL strength, reports hx of numbness/tingling that began a few months ago in plantar aspects of feet and toes bil    Cervical / Trunk Assessment Cervical / Trunk Assessment: Other exceptions Cervical / Trunk Exceptions: abdominal incision  Communication   Communication Communication: No apparent difficulties    Cognition Arousal: Alert Behavior During Therapy: WFL for tasks assessed/performed   PT - Cognitive impairments: No apparent impairments                         Following commands: Intact       Cueing Cueing Techniques: Verbal cues     General Comments General comments (skin integrity, edema, etc.): encouraged pt to sit up for each meal and walk in halls 3x/day while here, she verbalized understanding    Exercises     Assessment/Plan    PT Assessment Patient needs continued PT services  PT Problem List Decreased activity tolerance;Decreased balance;Decreased mobility;Pain       PT Treatment Interventions DME instruction;Stair training;Gait training;Functional mobility training;Therapeutic activities;Therapeutic exercise;Balance training;Neuromuscular re-education;Patient/family education    PT Goals (Current goals can be found in the Care Plan section)  Acute Rehab PT Goals Patient Stated Goal: to  recover PT Goal Formulation: With patient Time For Goal Achievement: 10/30/23 Potential to Achieve Goals: Good    Frequency Min 1X/week     Co-evaluation               AM-PAC PT 6 Clicks Mobility  Outcome Measure Help needed turning from your back to your side while in a flat bed without using bedrails?: A Little Help needed moving from lying on your back to sitting on the side of a flat bed without using bedrails?: A Little Help needed moving to and from a bed to a chair (including a wheelchair)?: A Little Help needed standing up from a chair using your arms (e.g., wheelchair or bedside chair)?: A Little Help needed to walk in hospital room?: A Little Help needed climbing 3-5 steps with a railing? : A Little 6 Click Score: 18    End of Session Equipment Utilized During Treatment: Gait belt Activity Tolerance: Patient tolerated treatment well Patient left: in chair;with call bell/phone within reach;with chair alarm set   PT Visit Diagnosis: Unsteadiness on feet (R26.81);Other abnormalities of gait and mobility (R26.89);Pain Pain - Right/Left:  (abdomen) Pain - part of body:  (abdomen)    Time: 8460-8397 PT Time Calculation (min) (ACUTE ONLY): 23 min   Charges:  PT Evaluation $PT Eval Low Complexity: 1 Low PT Treatments $Therapeutic Activity: 8-22 mins PT General Charges $$ ACUTE PT VISIT: 1 Visit         Tammy Marks, PT, DPT Acute Rehabilitation Services  Office: [phone]   Tammy Marks 10/16/2023, 5:58 PM

## 2023-10-16 NOTE — Plan of Care (Signed)
  Problem: Pain Managment: Goal: General experience of comfort will improve and/or be controlled Outcome: Progressing   Problem: Safety: Goal: Ability to remain free from injury will improve Outcome: Progressing

## 2023-10-16 NOTE — Plan of Care (Signed)
   Problem: Education: Goal: Knowledge of General Education information will improve Description: Including pain rating scale, medication(s)/side effects and non-pharmacologic comfort measures Outcome: Progressing   Problem: Coping: Goal: Level of anxiety will decrease Outcome: Progressing   Problem: Safety: Goal: Ability to remain free from injury will improve Outcome: Progressing

## 2023-10-16 NOTE — Progress Notes (Signed)
 1 Day Post-Op  Subjective: No acute issues overnight. Pain controlled, has only had very minimal use of PCA. No nausea, tolerating clear liquids.   Objective: Vital signs in last 24 hours: Temp:  [97.5 F (36.4 C)-98.2 F (36.8 C)] 97.8 F (36.6 C) (07/09 0400) Pulse Rate:  [67-94] 67 (07/09 0900) Resp:  [13-26] 25 (07/09 0900) BP: (115-166)/(63-90) 119/63 (07/09 0900) SpO2:  [97 %-100 %] 100 % (07/09 0900) Arterial Line BP: (114-178)/(66-82) 114/81 (07/08 2200) Weight:  [127.2 kg] 127.2 kg (07/08 1025) Last BM Date :  (PTA)  Intake/Output from previous day: 07/08 0701 - 07/09 0700 In: 3127.3 [I.V.:2032.3; Blood:945; IV Piggyback:150] Out: 2275 [Urine:775; Blood:1500] Intake/Output this shift: Total I/O In: 149.3 [I.V.:149.3] Out: 150 [Urine:150]  PE: General: resting comfortably, NAD Neuro: alert and oriented, no focal deficits Resp: normal work of breathing CV: RRR Abdomen: soft, nondistended, appropriately tender to palpation. R subcostal incision clean and dry with no erythema, induration or drainage. Extremities: warm and well-perfused GU: foley draining clear yellow urine.   Lab Results:  Recent Labs    10/15/23 1630 10/16/23 0535  WBC 21.9* 11.8*  HGB 11.8* 10.3*  HCT 35.4* 31.0*  PLT 296 244   BMET Recent Labs    10/15/23 1630 10/16/23 0535  NA 136 133*  K 3.9 4.7  CL 101 102  CO2 21* 21*  GLUCOSE 332* 207*  BUN 12 16  CREATININE 0.89 1.11*  CALCIUM  8.9 7.9*   PT/INR No results for input(s): LABPROT, INR in the last 72 hours. CMP     Component Value Date/Time   NA 133 (L) 10/16/2023 0535   NA 136 05/29/2021 1318   K 4.7 10/16/2023 0535   CL 102 10/16/2023 0535   CO2 21 (L) 10/16/2023 0535   GLUCOSE 207 (H) 10/16/2023 0535   BUN 16 10/16/2023 0535   BUN 7 05/29/2021 1318   CREATININE 1.11 (H) 10/16/2023 0535   CREATININE 0.75 10/03/2023 1248   CALCIUM  7.9 (L) 10/16/2023 0535   PROT 5.4 (L) 10/16/2023 0535   PROT 7.4  05/29/2021 1318   ALBUMIN  2.5 (L) 10/16/2023 0535   ALBUMIN  4.1 05/29/2021 1318   AST 312 (H) 10/16/2023 0535   AST 15 10/03/2023 1248   ALT 245 (H) 10/16/2023 0535   ALT 14 10/03/2023 1248   ALKPHOS 51 10/16/2023 0535   BILITOT 0.5 10/16/2023 0535   BILITOT 0.4 10/03/2023 1248   GFRNONAA 59 (L) 10/16/2023 0535   GFRNONAA >60 10/03/2023 1248   GFRAA >60 02/24/2019 1647   Lipase     Component Value Date/Time   LIPASE 22 02/24/2019 1647       Studies/Results: No results found.  Anti-infectives: Anti-infectives (From admission, onward)    Start     Dose/Rate Route Frequency Ordered Stop   10/15/23 1030  ceFAZolin  (ANCEF ) IVPB 3g/150 mL premix        3 g 300 mL/hr over 30 Minutes Intravenous On call to O.R. 10/15/23 1016 10/15/23 1145   10/15/23 1019  ceFAZolin  (ANCEF ) 3-4 GM/150ML-% IVPB       Note to Pharmacy: Carolynn Friday E: cabinet override      10/15/23 1019 10/15/23 1150        Assessment/Plan 58 yo female with colon cancer with metastatic disease to colon, s/p open hepatic metastasectomy x2 7/8. - Advance to regular diet - Slight bump in creatinine, continue IVF today. - Remove foley - Stop PCA, transition to prn oxycodone  and dilaudid . Scheduled tylenol  and  robaxin . Stop toradol  given increase in creatinine. - VTE: lovenox , SCDs - Dispo: transfer to med-surg floor     LOS: 1 day    Leonor Dawn, MD [facility] Surgery General, Hepatobiliary and Pancreatic Surgery 10/16/23 9:35 AM

## 2023-10-16 NOTE — Progress Notes (Signed)
Received report for patient transfer.

## 2023-10-17 LAB — COMPREHENSIVE METABOLIC PANEL WITH GFR
ALT: 198 U/L — ABNORMAL HIGH (ref 0–44)
AST: 154 U/L — ABNORMAL HIGH (ref 15–41)
Albumin: 2.6 g/dL — ABNORMAL LOW (ref 3.5–5.0)
Alkaline Phosphatase: 52 U/L (ref 38–126)
Anion gap: 9 (ref 5–15)
BUN: 13 mg/dL (ref 6–20)
CO2: 25 mmol/L (ref 22–32)
Calcium: 8.3 mg/dL — ABNORMAL LOW (ref 8.9–10.3)
Chloride: 104 mmol/L (ref 98–111)
Creatinine, Ser: 0.95 mg/dL (ref 0.44–1.00)
GFR, Estimated: >60 mL/min (ref >60)
Glucose, Bld: 181 mg/dL — ABNORMAL HIGH (ref 70–99)
Potassium: 3.8 mmol/L (ref 3.5–5.1)
Sodium: 138 mmol/L (ref 135–145)
Total Bilirubin: 0.5 mg/dL (ref 0.0–1.2)
Total Protein: 5.6 g/dL — ABNORMAL LOW (ref 6.5–8.1)

## 2023-10-17 LAB — CBC
HCT: 26.8 % — ABNORMAL LOW (ref 36.0–46.0)
Hemoglobin: 8.8 g/dL — ABNORMAL LOW (ref 12.0–15.0)
MCH: 27.3 pg (ref 26.0–34.0)
MCHC: 32.8 g/dL (ref 30.0–36.0)
MCV: 83.2 fL (ref 80.0–100.0)
Platelets: 236 K/uL (ref 150–400)
RBC: 3.22 MIL/uL — ABNORMAL LOW (ref 3.87–5.11)
RDW: 15.9 % — ABNORMAL HIGH (ref 11.5–15.5)
WBC: 11 K/uL — ABNORMAL HIGH (ref 4.0–10.5)
nRBC: 0 % (ref 0.0–0.2)

## 2023-10-17 LAB — GLUCOSE, CAPILLARY
Glucose-Capillary: 164 mg/dL — ABNORMAL HIGH (ref 70–99)
Glucose-Capillary: 232 mg/dL — ABNORMAL HIGH (ref 70–99)
Glucose-Capillary: 280 mg/dL — ABNORMAL HIGH (ref 70–99)
Glucose-Capillary: 245 mg/dL — ABNORMAL HIGH (ref 70–99)

## 2023-10-17 MED ORDER — DOCUSATE SODIUM 100 MG PO CAPS: 100.0000 mg | ORAL_CAPSULE | Freq: Two times a day (BID) | ORAL | 0 refills | Status: AC

## 2023-10-17 MED ORDER — POLYETHYLENE GLYCOL 3350 17 G PO PACK: 17.0000 g | PACK | Freq: Every day | ORAL | Status: AC | PRN

## 2023-10-17 MED ORDER — OXYCODONE HCL 5 MG PO TABS: 5.0000 mg | ORAL_TABLET | Freq: Four times a day (QID) | ORAL | 0 refills | Status: AC | PRN

## 2023-10-17 MED ORDER — METHOCARBAMOL 500 MG PO TABS
1000.0000 mg | ORAL_TABLET | Freq: Three times a day (TID) | ORAL | Status: DC
  Administered 2023-10-17 – 2023-10-20 (×10): 1000 mg via ORAL
  Filled 2023-10-17 (×10): qty 2

## 2023-10-17 MED ORDER — METHOCARBAMOL 1000 MG PO TABS: 1000.0000 mg | ORAL_TABLET | Freq: Three times a day (TID) | ORAL | 0 refills | Status: AC | PRN

## 2023-10-17 MED ORDER — SODIUM CHLORIDE 0.9% FLUSH
10.0000 mL | Freq: Two times a day (BID) | INTRAVENOUS | Status: DC
  Administered 2023-10-17 – 2023-10-20 (×7): 10 mL

## 2023-10-17 MED ORDER — ACETAMINOPHEN 500 MG PO TABS: 1000.0000 mg | ORAL_TABLET | Freq: Three times a day (TID) | ORAL | Status: AC | PRN

## 2023-10-17 MED ORDER — SODIUM CHLORIDE 0.9% FLUSH: 10.0000 mL | INTRAVENOUS | Status: DC | PRN

## 2023-10-17 NOTE — Plan of Care (Signed)
   Problem: Education: Goal: Knowledge of General Education information will improve Description Including pain rating scale, medication(s)/side effects and non-pharmacologic comfort measures Outcome: Progressing

## 2023-10-17 NOTE — Progress Notes (Signed)
    2 Days Post-Op  Subjective: No acute events overnight. Emesis charted but patient denies any nausea or vomiting. Tolerating regular diet.   Objective: Vital signs in last 24 hours: Temp:  [97.6 F (36.4 C)-98.8 F (37.1 C)] 98.1 F (36.7 C) (07/10 0438) Pulse Rate:  [67-88] 83 (07/10 0438) Resp:  [15-25] 18 (07/10 0438) BP: (115-148)/(63-81) 124/68 (07/10 0438) SpO2:  [94 %-100 %] 94 % (07/10 0438) Last BM Date : 10/15/23  Intake/Output from previous day: 07/09 0701 - 07/10 0700 In: 1928.6 [P.O.:480; I.V.:1448.6] Out: 150 [Urine:150] Intake/Output this shift: No intake/output data recorded.  PE: General: resting comfortably, NAD Neuro: alert and oriented, no focal deficits Resp: normal work of breathing on room air Abdomen: soft, nondistended, appropriately tender to palpation. R subcostal incision clean and dry with no erythema, induration or drainage. Extremities: warm and well-perfused   Lab Results:  Recent Labs    10/15/23 1630 10/16/23 0535  WBC 21.9* 11.8*  HGB 11.8* 10.3*  HCT 35.4* 31.0*  PLT 296 244   BMET Recent Labs    10/15/23 1630 10/16/23 0535  NA 136 133*  K 3.9 4.7  CL 101 102  CO2 21* 21*  GLUCOSE 332* 207*  BUN 12 16  CREATININE 0.89 1.11*  CALCIUM  8.9 7.9*   PT/INR No results for input(s): LABPROT, INR in the last 72 hours. CMP     Component Value Date/Time   NA 133 (L) 10/16/2023 0535   NA 136 05/29/2021 1318   K 4.7 10/16/2023 0535   CL 102 10/16/2023 0535   CO2 21 (L) 10/16/2023 0535   GLUCOSE 207 (H) 10/16/2023 0535   BUN 16 10/16/2023 0535   BUN 7 05/29/2021 1318   CREATININE 1.11 (H) 10/16/2023 0535   CREATININE 0.75 10/03/2023 1248   CALCIUM  7.9 (L) 10/16/2023 0535   PROT 5.4 (L) 10/16/2023 0535   PROT 7.4 05/29/2021 1318   ALBUMIN  2.5 (L) 10/16/2023 0535   ALBUMIN  4.1 05/29/2021 1318   AST 312 (H) 10/16/2023 0535   AST 15 10/03/2023 1248   ALT 245 (H) 10/16/2023 0535   ALT 14 10/03/2023 1248    ALKPHOS 51 10/16/2023 0535   BILITOT 0.5 10/16/2023 0535   BILITOT 0.4 10/03/2023 1248   GFRNONAA 59 (L) 10/16/2023 0535   GFRNONAA >60 10/03/2023 1248   GFRAA >60 02/24/2019 1647   Lipase     Component Value Date/Time   LIPASE 22 02/24/2019 1647         Assessment/Plan 58 yo female with colon cancer with metastatic disease to colon, s/p open hepatic metastasectomy x2 7/8. - Regular diet - Slight increase in creatinine yesterday, labs pending today. SLIV. - Multimodal pain control: scheduled tylenol  and robaxin , prn oxycodone  and dilaudid  - T2DM: home glipizide  and metformin  on hold, continue sliding scale insulin  while inpatient - Mobilize. Ambulating independently in room. Cleared by PT. - VTE: lovenox , SCDs - Dispo: med-surg floor. Anticipate discharge in next 1-2 days if pain controlled and mobilizing without difficulty.    LOS: 2 days    Leonor Dawn, MD [facility] Surgery General, Hepatobiliary and Pancreatic Surgery 10/17/23 7:13 AM

## 2023-10-17 NOTE — Evaluation (Signed)
 Occupational Therapy Evaluation Patient Details Name: Tammy Marks MRN: [id number] DOB: 1965-08-19 Today's Date: 10/17/2023   History of Present Illness   Pt is a 58 y.o. female who presented 10/15/23 for staging laparoscopy, intraoperative liver ultrasound, and open R hepatic metastasectomy x2 secondary to colon cancer with metastatic disease to liver. PMH: anemia, colon cancer with chemotherapy, DM     Clinical Impressions At baseline, pt is Independent with ADLs, IADLs, and functional mobility without an AD. At baseline, pt works and drives. Pt now presents with decreased activity tolerance, pain affecting functional level, decreased knowledge of precautions, decreased knowledge of AE/DME, and decreased safety and independence with functional tasks. Pt currently largely completing ADLs Independent to Max assist. Pt participated well in session and is motivated to return to PLOF. Pt will benefit from acute skilled OT services to address deficits outlined below and increase safety and independence with functional tasks. No post acute skilled OT needs are anticipated at this time.      If plan is discharge home, recommend the following:   A little help with walking and/or transfers;A lot of help with bathing/dressing/bathroom;Assistance with cooking/housework;Assist for transportation;Help with stairs or ramp for entrance     Functional Status Assessment   Patient has had a recent decline in their functional status and demonstrates the ability to make significant improvements in function in a reasonable and predictable amount of time.     Equipment Recommendations   Tub/shower bench;BSC/3in1;Other (comment) (Pt requires a bariatric BSC/3in1 and bariatric tub bench; reacher; sock aid; long handled sponge)     Recommendations for Other Services         Precautions/Restrictions   Precautions Precautions: Fall;Other (comment) Precaution/Restrictions Comments: abdominal  precautions Restrictions Weight Bearing Restrictions Per Provider Order: No     Mobility Bed Mobility Overal bed mobility: Needs Assistance             General bed mobility comments: Pt sitting in recliner at beginning and end of session. However, during session pt reported significant pain with log roll in/out of bed. OT educated pt in use of pillow to brace abdominal incision to increase support and decrease pain with pt verbalizing understanding and stating she will trial this when she transfers back to bed but declining trial this session due to pain.    Transfers Overall transfer level: Needs assistance Equipment used: None (IV pole) Transfers: Sit to/from Stand, Bed to chair/wheelchair/BSC Sit to Stand: Supervision     Step pivot transfers: Supervision     General transfer comment: Supervision for safety; requires increased time and effort; pt holding IV pole and using grab bars during toitleting transfer      Balance Overall balance assessment: Mild deficits observed, not formally tested                                         ADL either performed or assessed with clinical judgement   ADL Overall ADL's : Needs assistance/impaired Eating/Feeding: Independent;Sitting   Grooming: Supervision/safety;Standing (with increased time and effort)   Upper Body Bathing: Minimal assistance;Cueing for compensatory techniques;Sitting (with increased time and effort; adhering to abdominal precautions) Upper Body Bathing Details (indicate cue type and reason): simulated Lower Body Bathing: Maximal assistance;Sit to/from stand;Cueing for compensatory techniques (with increased time and effort; adhering to abdominal precautions) Lower Body Bathing Details (indicate cue type and reason): simulated Upper Body Dressing : Contact guard  assist;Sitting;Cueing for compensatory techniques (with increased time and effort; adhering to abdominal precautions)   Lower Body  Dressing: Maximal assistance;Sitting/lateral leans;Cueing for compensatory techniques (with increased time and effort; adhering to abdominal precautions)   Toilet Transfer: Supervision/safety;Ambulation;Regular Toilet;Grab bars (holding IV pole during ambulation; with increased time and effort; adhering to abdominal precautions)   Toileting- Clothing Manipulation and Hygiene: Supervision/safety;Sitting/lateral lean;Sit to/from stand (with increased time and effort; adhering to abdominal precautions)       Functional mobility during ADLs: Supervision/safety (holding IV pole during ambulation) General ADL Comments: Pt with decreased activity tolerance and pain affecting funcitonal level. Pt may benefit from AE for increased independence and safety with LB ADLs. OT initiated eduction in energy conservation strategies.     Vision Ability to See in Adequate Light: 0 Adequate Patient Visual Report: No change from baseline Vision Assessment?: No apparent visual deficits     Perception         Praxis         Pertinent Vitals/Pain Pain Assessment Pain Assessment: 0-10 Pain Score: 6  (4 at rest, 6 with movement) Pain Location: abdomen Pain Descriptors / Indicators: Discomfort, Grimacing, Guarding, Operative site guarding Pain Intervention(s): Limited activity within patient's tolerance, Monitored during session, Repositioned, RN gave pain meds during session     Extremity/Trunk Assessment Upper Extremity Assessment Upper Extremity Assessment: Right hand dominant;Overall WFL for tasks assessed;RUE deficits/detail;LUE deficits/detail RUE Deficits / Details: strength, ROM, coordinaiton, and sensation overall WFL; edematous throughout UE (R worse than L) LUE Deficits / Details: strength, ROM, coordinaiton, and sensation overall WFL; edematous throughout UE (R worse than L)   Lower Extremity Assessment Lower Extremity Assessment: Defer to PT evaluation   Cervical / Trunk  Assessment Cervical / Trunk Assessment: Other exceptions Cervical / Trunk Exceptions: abdominal incision   Communication Communication Communication: No apparent difficulties   Cognition Arousal: Alert Behavior During Therapy: WFL for tasks assessed/performed Cognition: No apparent impairments             OT - Cognition Comments: AAOx4 and pleasant throughout session.                 Following commands: Intact       Cueing  General Comments   Cueing Techniques: Verbal cues  encouraged pt to perform gentle B UE AROM exercises in hospital and at home to decreased B UE edema, decreased pt reproted feeling of stiffness, and maintain ROM and strength with pt verbalizing understanding   Exercises Exercises: General Upper Extremity General Exercises - Upper Extremity Shoulder Flexion: AROM, Both, 5 reps, Seated Shoulder Extension: AROM, Both, 5 reps, Seated Shoulder Horizontal ABduction: AROM, Both, 5 reps, Seated Shoulder Horizontal ADduction: AROM, Both, 5 reps, Seated Elbow Flexion: AROM, Both, 5 reps, Seated Elbow Extension: AROM, Both, 5 reps, Seated Wrist Flexion: AROM, Both, 5 reps, Seated Wrist Extension: AROM, Both, 5 reps, Seated   Shoulder Instructions      Home Living Family/patient expects to be discharged to:: Private residence Living Arrangements: Alone Available Help at Discharge: Other (Comment) (Pt reports none) Type of Home: Apartment Home Access: Level entry     Home Layout: One level     Bathroom Shower/Tub: Chief Strategy Officer: Standard     Home Equipment: Agricultural consultant (2 wheels)   Additional Comments: Pt reprots she has a recliner that she sometimes sleeps in and plans to sleep in for at least a few days following discharge      Prior Functioning/Environment Prior Level of  Function : Independent/Modified Independent;Working/employed;Driving             Mobility Comments: Ind without an AD ADLs Comments: Ind  with ADLs and IADLs, drives; works from home in [employer] for the [employer]    OT Problem List: Decreased activity tolerance;Decreased knowledge of use of DME or AE;Pain;Decreased knowledge of precautions   OT Treatment/Interventions: Self-care/ADL training;Therapeutic exercise;Energy conservation;DME and/or AE instruction;Therapeutic activities;Patient/family education;Balance training      OT Goals(Current goals can be found in the care plan section)   Acute Rehab OT Goals Patient Stated Goal: to heal well, have less pain, and return home OT Goal Formulation: With patient Time For Goal Achievement: 10/31/23 Potential to Achieve Goals: Good ADL Goals Pt Will Perform Upper Body Bathing: with modified independence;sitting;with adaptive equipment (adhering to abdominal precautions) Pt Will Perform Lower Body Bathing: with modified independence;sitting/lateral leans;sit to/from stand;with adaptive equipment (adhering to abdominal precautions) Pt Will Perform Lower Body Dressing: with modified independence;sitting/lateral leans;sit to/from stand;with adaptive equipment (adhering to abdominal precautions) Pt Will Perform Tub/Shower Transfer: Tub transfer;with supervision;ambulating;tub bench (with least restrictive AD; adhering to abdominal precautions) Additional ADL Goal #1: Patient will demonstrate ability to Independently state 4 energy conservation strategies to increase safety and independence with funcitonal tasks.   OT Frequency:  Min 1X/week    Co-evaluation              AM-PAC OT 6 Clicks Daily Activity     Outcome Measure Help from another person eating meals?: None Help from another person taking care of personal grooming?: A Little Help from another person toileting, which includes using toliet, bedpan, or urinal?: A Little Help from another person bathing (including washing, rinsing, drying)?: A Lot Help from another person to put on and taking off regular upper body  clothing?: A Little Help from another person to put on and taking off regular lower body clothing?: A Lot 6 Click Score: 17   End of Session Nurse Communication: Mobility status;Other (comment) (Discharge plan. OT POC.)  Activity Tolerance: Patient tolerated treatment well;Patient limited by pain Patient left: in chair;with call bell/phone within reach;with nursing/sitter in room  OT Visit Diagnosis: Other abnormalities of gait and mobility (R26.89);Other (comment);Pain (decreased activity tolerance)                Time: 8383-8346 OT Time Calculation (min): 37 min Charges:  OT General Charges $OT Visit: 1 Visit OT Evaluation $OT Eval Moderate Complexity: 1 Mod OT Treatments $Self Care/Home Management : 8-22 mins  Margarie Rockey HERO., OTR/L, MA Acute Rehab [phone]   Margarie FORBES Horns 10/17/2023, 9:41 PM

## 2023-10-17 NOTE — Discharge Instructions (Addendum)
 [facility] DISCHARGE INSTRUCTIONS  Activity No heavy lifting greater than 15 pounds for 8 weeks after surgery. Ok to shower, but do not bathe or submerge incisions underwater. Do not drive while taking narcotic pain medication. You may drive when you are no longer taking prescription pain medication, you can comfortably wear a seatbelt, and you can safely maneuver your car and apply brakes.  Wound Care Your incisions are covered with skin glue called Dermabond. This will peel off on its own over time. You may shower and allow warm soapy water to run over your incisions. Gently pat dry. Do not submerge your incision underwater until cleared by your surgeon. Monitor your incision for any new redness, tenderness, or drainage. Many patients will experience some swelling and bruising at the incisions.  Ice packs will help.  Swelling and bruising can take several days to resolve.   Medications A  prescription for pain medication may be given to you upon discharge.  Take your pain medication as prescribed, if needed.  If narcotic pain medicine is not needed, then you may take acetaminophen  (Tylenol ) or ibuprofen (Advil) as needed. It is common to experience some constipation if taking pain medication after surgery.  Increasing fluid intake and taking a stool softener (such as Colace) will usually help or prevent this problem from occurring.  A mild laxative (Milk of Magnesia or Miralax ) should be taken according to package directions if there are no bowel movements after 48 hours. Take your usually prescribed medications unless otherwise directed. If you need a refill on your pain medication, please contact your pharmacy.  They will contact our office to request authorization. Prescriptions will not be filled after 5 pm or on weekends.  When to Call Us : Fever greater than 100.5 New redness, drainage, or swelling at incision site Severe pain, nausea, or vomiting Persistent bleeding  from incisions Jaundice (yellowing of the whites of the eyes or skin)  Follow-up You have an appointment scheduled with Dr. Dasie on November 01, 2023 at 9:20am. This will be at the [facility] office at [address], [place], [place]. Please arrive at least 15 minutes prior to your scheduled appointment time.  IF YOU HAVE DISABILITY OR FAMILY LEAVE FORMS, YOU MUST BRING THEM TO THE OFFICE FOR PROCESSING.   DO NOT GIVE THEM TO YOUR DOCTOR.  The clinic staff is available to answer your questions during regular business hours.  Please don't hesitate to call and ask to speak to one of the nurses for clinical concerns.  If you have a medical emergency, go to the nearest emergency room or call 911.  A surgeon from [facility] is always on call at the hospital  [address], [place], [place]  [address] ?  [address] [address], [place], [place]   [address] [phone] ? Toll Free: [phone] ? FAX [phone] Web site: [email or web address]      Managing Your Pain After Surgery Without Opioids    Thank you for participating in our program to help patients manage their pain after surgery without opioids. This is part of our effort to provide you with the best care possible, without exposing you or your family to the risk that opioids pose.  What pain can I expect after surgery? You can expect to have some pain after surgery. This is normal. The pain is typically worse the day after surgery, and quickly begins to get better. Many studies have found that many  patients are able to manage their pain after surgery with Over-the-Counter (OTC) medications such as Tylenol  and Motrin. If you have a condition that does not allow you to take Tylenol  or Motrin, notify your surgical team.  How will I manage my pain? The best strategy for controlling your pain after surgery is around the clock pain control with Tylenol  (acetaminophen ) and Motrin  (ibuprofen or Advil). Alternating these medications with each other allows you to maximize your pain control. In addition to Tylenol  and Motrin, you can use heating pads or ice packs on your incisions to help reduce your pain.  How will I alternate your regular strength over-the-counter pain medication? You will take a dose of pain medication every three hours. Start by taking 650 mg of Tylenol  (2 pills of 325 mg) 3 hours later take 600 mg of Motrin (3 pills of 200 mg) 3 hours after taking the Motrin take 650 mg of Tylenol  3 hours after that take 600 mg of Motrin.   - 1 -  See example - if your first dose of Tylenol  is at 12:00 PM   12:00 PM Tylenol  650 mg (2 pills of 325 mg)  3:00 PM Motrin 600 mg (3 pills of 200 mg)  6:00 PM Tylenol  650 mg (2 pills of 325 mg)  9:00 PM Motrin 600 mg (3 pills of 200 mg)  Continue alternating every 3 hours   We recommend that you follow this schedule around-the-clock for at least 3 days after surgery, or until you feel that it is no longer needed. Use the table on the last page of this handout to keep track of the medications you are taking. Important: Do not take more than 3000mg  of Tylenol  or 3200mg  of Motrin in a 24-hour period. Do not take ibuprofen/Motrin if you have a history of bleeding stomach ulcers, severe kidney disease, &/or actively taking a blood thinner  What if I still have pain? If you have pain that is not controlled with the over-the-counter pain medications (Tylenol  and Motrin or Advil) you might have what we call "breakthrough" pain. You will receive a prescription for a small amount of an opioid pain medication such as Oxycodone , Tramadol , or Tylenol  with Codeine. Use these opioid pills in the first 24 hours after surgery if you have breakthrough pain. Do not take more than 1 pill every 4-6 hours.  If you still have uncontrolled pain after using all opioid pills, don't hesitate to call our staff using the number provided. We will  help make sure you are managing your pain in the best way possible, and if necessary, we can provide a prescription for additional pain medication.   Day 1    Time  Name of Medication Number of pills taken  Amount of Acetaminophen   Pain Level   Comments  AM PM       AM PM       AM PM       AM PM       AM PM       AM PM       AM PM       AM PM       Total Daily amount of Acetaminophen  Do not take more than  3,000 mg per day      Day 2    Time  Name of Medication Number of pills taken  Amount of Acetaminophen   Pain Level   Comments  AM PM  AM PM       AM PM       AM PM       AM PM       AM PM       AM PM       AM PM       Total Daily amount of Acetaminophen  Do not take more than  3,000 mg per day      Day 3    Time  Name of Medication Number of pills taken  Amount of Acetaminophen   Pain Level   Comments  AM PM       AM PM       AM PM       AM PM         AM PM       AM PM       AM PM       AM PM       Total Daily amount of Acetaminophen  Do not take more than  3,000 mg per day      Day 4    Time  Name of Medication Number of pills taken  Amount of Acetaminophen   Pain Level   Comments  AM PM       AM PM       AM PM       AM PM       AM PM       AM PM       AM PM       AM PM       Total Daily amount of Acetaminophen  Do not take more than  3,000 mg per day      Day 5    Time  Name of Medication Number of pills taken  Amount of Acetaminophen   Pain Level   Comments  AM PM       AM PM       AM PM       AM PM       AM PM       AM PM       AM PM       AM PM       Total Daily amount of Acetaminophen  Do not take more than  3,000 mg per day      Day 6    Time  Name of Medication Number of pills taken  Amount of Acetaminophen   Pain Level  Comments  AM PM       AM PM       AM PM       AM PM       AM PM       AM PM       AM PM       AM PM       Total Daily amount of Acetaminophen  Do not take more than   3,000 mg per day      Day 7    Time  Name of Medication Number of pills taken  Amount of Acetaminophen   Pain Level   Comments  AM PM       AM PM       AM PM       AM PM       AM PM       AM PM       AM PM       AM PM  Total Daily amount of Acetaminophen  Do not take more than  3,000 mg per day        For additional information about how and where to safely dispose of unused opioid medications - [email or web address]  Disclaimer: This document contains information and/or instructional materials adapted from [facility] for the typical patient with your condition. It does not replace medical advice from your health care provider because your experience may differ from that of the typical patient. Talk to your health care provider if you have any questions about this document, your condition or your treatment plan. Adapted from [facility]

## 2023-10-17 NOTE — Progress Notes (Signed)
   10/17/23 0924  TOC Brief Assessment  Insurance and Status Reviewed  Patient has primary care physician Yes  Home environment has been reviewed lives alone  Prior level of function: independent  Prior/Current Home Services No current home services  Social Drivers of Health Review SDOH reviewed no interventions necessary  Readmission risk has been reviewed No  Transition of care needs no transition of care needs at this time       Transition of Care Department [facility]) has reviewed patient and no TOC needs have been identified at this time. We will continue to monitor patient advancement through interdisciplinary progression rounds. If new patient transition needs arise, please place a TOC consult.

## 2023-10-18 LAB — SURGICAL PATHOLOGY

## 2023-10-18 LAB — GLUCOSE, CAPILLARY
Glucose-Capillary: 204 mg/dL — ABNORMAL HIGH (ref 70–99)
Glucose-Capillary: 205 mg/dL — ABNORMAL HIGH (ref 70–99)
Glucose-Capillary: 191 mg/dL — ABNORMAL HIGH (ref 70–99)
Glucose-Capillary: 267 mg/dL — ABNORMAL HIGH (ref 70–99)

## 2023-10-18 MED ORDER — POLYETHYLENE GLYCOL 3350 17 G PO PACK
17.0000 g | PACK | Freq: Every day | ORAL | Status: DC
  Administered 2023-10-18 – 2023-10-20 (×3): 17 g via ORAL
  Filled 2023-10-18 (×3): qty 1

## 2023-10-18 NOTE — Plan of Care (Signed)
  Problem: Pain Managment: Goal: General experience of comfort will improve and/or be controlled Outcome: Progressing   Problem: Safety: Goal: Ability to remain free from injury will improve Outcome: Progressing

## 2023-10-18 NOTE — Progress Notes (Signed)
 Physical Therapy Treatment Patient Details Name: Tammy Marks MRN: [id number] DOB: Nov 28, 1965 Today's Date: 10/18/2023   History of Present Illness Pt is a 58 y.o. female who presented 10/15/23 for staging laparoscopy, intraoperative liver ultrasound, and open R hepatic metastasectomy x2 secondary to colon cancer with metastatic disease to liver. PMH: anemia, colon cancer with chemotherapy, DM.    PT Comments  Pt greeted seated in recliner chair, pleasant and agreeable to PT session. She increased her gait distance ambulating ~259ft without an AD. Pt ambulated slowly while guarding her abdomen with BUE. Overall functional mobility required supervision for safety. Pt is limited by pain and decreased activity tolerance. Will continue to follow acutely and advance appropriately.     If plan is discharge home, recommend the following: A little help with bathing/dressing/bathroom;Assistance with cooking/housework   Can travel by private vehicle        Equipment Recommendations  None recommended by PT    Recommendations for Other Services       Precautions / Restrictions Precautions Precautions: Fall;Other (comment) Recall of Precautions/Restrictions: Intact Precaution/Restrictions Comments: abdominal precautions Restrictions Weight Bearing Restrictions Per Provider Order: No     Mobility  Bed Mobility               General bed mobility comments: Not assessed. Pt greeted seated in recliner chair and returned there at end of session.    Transfers Overall transfer level: Needs assistance Equipment used: None Transfers: Sit to/from Stand Sit to Stand: Supervision           General transfer comment: Pt stood from recliner chair by pushing up with BUE support on arm rests. Good eccentric control with sitting.    Ambulation/Gait Ambulation/Gait assistance: Supervision Gait Distance (Feet): 250 Feet Assistive device: None Gait Pattern/deviations: Step-through pattern,  Decreased stride length, Trunk flexed Gait velocity: reduced Gait velocity interpretation: <1.8 ft/sec, indicate of risk for recurrent falls   General Gait Details: Pt ambulated without arm swing as BUE support was guarding her abdomen. She intermittently utilized handrail with unilat UE while in hallway. Pt took small, slow steps with adequate foot clearence. No LOB.   Stairs             Wheelchair Mobility     Tilt Bed    Modified Rankin (Stroke Patients Only)       Balance Overall balance assessment: Mild deficits observed, not formally tested                                          Communication Communication Communication: No apparent difficulties  Cognition Arousal: Alert Behavior During Therapy: WFL for tasks assessed/performed   PT - Cognitive impairments: No apparent impairments                         Following commands: Intact      Cueing Cueing Techniques: Verbal cues  Exercises      General Comments        Pertinent Vitals/Pain Pain Assessment Pain Assessment: Faces Faces Pain Scale: Hurts little more Pain Location: abdomen Pain Descriptors / Indicators: Discomfort, Grimacing, Guarding, Operative site guarding Pain Intervention(s): Limited activity within patient's tolerance, Monitored during session    Home Living                          Prior  Function            PT Goals (current goals can now be found in the care plan section) Acute Rehab PT Goals Patient Stated Goal: Feel better and move easier Progress towards PT goals: Progressing toward goals    Frequency    Min 1X/week      PT Plan      Co-evaluation              AM-PAC PT 6 Clicks Mobility   Outcome Measure  Help needed turning from your back to your side while in a flat bed without using bedrails?: A Little Help needed moving from lying on your back to sitting on the side of a flat bed without using bedrails?: A  Little Help needed moving to and from a bed to a chair (including a wheelchair)?: A Little Help needed standing up from a chair using your arms (e.g., wheelchair or bedside chair)?: A Little Help needed to walk in hospital room?: A Little Help needed climbing 3-5 steps with a railing? : A Little 6 Click Score: 18    End of Session Equipment Utilized During Treatment: Gait belt Activity Tolerance: Patient tolerated treatment well;Patient limited by fatigue Patient left: in chair;with call bell/phone within reach;Other (comment) (with OT entering) Nurse Communication: Mobility status PT Visit Diagnosis: Unsteadiness on feet (R26.81);Other abnormalities of gait and mobility (R26.89);Pain Pain - part of body:  (Abdomen)     Time: 1550-1605 PT Time Calculation (min) (ACUTE ONLY): 15 min  Charges:    $Gait Training: 8-22 mins PT General Charges $$ ACUTE PT VISIT: 1 Visit                     Randall SAUNDERS, PT, DPT Acute Rehabilitation Services Office: [phone] Secure Chat Preferred  Tammy Marks 10/18/2023, 4:43 PM

## 2023-10-18 NOTE — Inpatient Diabetes Management (Signed)
 Inpatient Diabetes Program Recommendations  AACE/ADA: New Consensus Statement on Inpatient Glycemic Control (2015)  Target Ranges:  Prepandial:   less than 140 mg/dL      Peak postprandial:   less than 180 mg/dL (1-2 hours)      Critically ill patients:  140 - 180 mg/dL   Lab Results  Component Value Date   GLUCAP 205 (H) 10/18/2023   HGBA1C 8.1 (H) 10/10/2023    Review of Glycemic Control  Latest Reference Range & Units 10/17/23 08:31 10/17/23 12:30 10/17/23 16:57 10/17/23 22:31 10/18/23 07:44 10/18/23 11:56  Glucose-Capillary 70 - 99 mg/dL 835 (H) 767 (H) 719 (H) 245 (H) 204 (H) 205 (H)  (H): Data is abnormally high Diabetes history: TYpe 2 DM Outpatient Diabetes medications: Glipizide  10 mg every day, Metformin  1000 mg BID Current orders for Inpatient glycemic control: Novolog  0-20 units TID & HS  Inpatient Diabetes Program Recommendations:    Consider adding Semglee 12 units every day and changing diet to carb modified (if appropriate with plan of care).   Thanks, Tinnie Minus, MSN, RNC-OB Diabetes Coordinator [phone] (8a-5p)

## 2023-10-18 NOTE — Progress Notes (Signed)
 Occupational Therapy Treatment Patient Details Name: Tammy Marks MRN: [id number] DOB: 1965/06/30 Today's Date: 10/18/2023   History of present illness Pt is a 58 y.o. female who presented 10/15/23 for staging laparoscopy, intraoperative liver ultrasound, and open R hepatic metastasectomy x2 secondary to colon cancer with metastatic disease to liver. PMH: anemia, colon cancer with chemotherapy, DM   OT comments  OT session focused on education in use of AE for UB/LB bathing and LB dressing, including training in use of sock aid, reacher, long handled shoe horn, and long handled sponge. Pt verbalized and demonstrated understanding of all education through teach back. Pt currently able to largely complete ADLs Independent to Contact guard assist with use of AE and increased time and effort. Pt participated well in session and is making good progress toward OT goals. Pt will benefit from continued acute OT services to address deficits outlined below and increase safety and independence with functional tasks. No post acute skilled OT needs are anticipated at this time.       If plan is discharge home, recommend the following:  A little help with walking and/or transfers;A little help with bathing/dressing/bathroom;Assistance with cooking/housework;Assist for transportation;Help with stairs or ramp for entrance   Equipment Recommendations  Tub/shower bench;BSC/3in1;Other (comment) (Pt requires a bariatric BSC/3in1 and bariatric tub bench; reacher; sock aid; long handled sponge)    Recommendations for Other Services      Precautions / Restrictions Precautions Precautions: Fall;Other (comment) Recall of Precautions/Restrictions: Intact Precaution/Restrictions Comments: abdominal precautions Restrictions Weight Bearing Restrictions Per Provider Order: No       Mobility Bed Mobility               General bed mobility comments: Not addressed this session.    Transfers Overall  transfer level: Needs assistance Equipment used: None Transfers: Sit to/from Stand Sit to Stand: Supervision                 Balance Overall balance assessment: Mild deficits observed, not formally tested                                         ADL either performed or assessed with clinical judgement   ADL Overall ADL's : Needs assistance/impaired Eating/Feeding: Independent;Sitting       Upper Body Bathing: Supervision/ safety;Set up;With adaptive equipment;Sitting;Cueing for compensatory techniques (with increased time and effort; adhering to abdominal precautions) Upper Body Bathing Details (indicate cue type and reason): simulated Lower Body Bathing: Supervison/ safety;Set up;With adaptive equipment;Cueing for compensatory techniques;Sitting/lateral leans;Sit to/from stand (with increased time and effort; adhering to abdominal precautions) Lower Body Bathing Details (indicate cue type and reason): simulated     Lower Body Dressing: Set up;Supervision/safety;Contact guard assist;With adaptive equipment;Cueing for compensatory techniques;Sitting/lateral leans;Sit to/from stand (with increased time and effort; adhering to abdominal precautions)                 General ADL Comments: OT educated pt in use of AE (sock aid, reacher, long handled shoe horn, long handled sponge) for UB/LB bathing and LB dressing with pt verbalizing and demonstrating good understanding of all education through teach back and with pt reporting AE is helpful.    Extremity/Trunk Assessment Upper Extremity Assessment Upper Extremity Assessment: Right hand dominant;RUE deficits/detail;LUE deficits/detail RUE Deficits / Details: strength, ROM, coordinaiton, and sensation overall WFL; edematous throughout UE (R worse than L) LUE Deficits / Details: strength,  ROM, coordinaiton, and sensation overall WFL; edematous throughout UE (R worse than L)   Lower Extremity Assessment Lower  Extremity Assessment: Defer to PT evaluation        Vision   Vision Assessment?: No apparent visual deficits   Perception     Praxis     Communication Communication Communication: No apparent difficulties   Cognition Arousal: Alert Behavior During Therapy: WFL for tasks assessed/performed Cognition: No apparent impairments             OT - Cognition Comments: AAOx4 and pleasant throughout session.                 Following commands: Intact        Cueing   Cueing Techniques: Verbal cues  Exercises      Shoulder Instructions       General Comments PT ending session with pt upon OT arrival. RN present and giving meds at end of session.    Pertinent Vitals/ Pain       Pain Assessment Pain Assessment: Faces Faces Pain Scale: Hurts even more Pain Location: abdomen Pain Descriptors / Indicators: Discomfort, Grimacing, Guarding, Operative site guarding Pain Intervention(s): Limited activity within patient's tolerance, Monitored during session, Repositioned, RN gave pain meds during session  Home Living                                          Prior Functioning/Environment              Frequency  Min 1X/week        Progress Toward Goals  OT Goals(current goals can now be found in the care plan section)  Progress towards OT goals: Progressing toward goals  Acute Rehab OT Goals Patient Stated Goal: to return home and remain independent  Plan      Co-evaluation                 AM-PAC OT 6 Clicks Daily Activity     Outcome Measure   Help from another person eating meals?: None Help from another person taking care of personal grooming?: A Little Help from another person toileting, which includes using toliet, bedpan, or urinal?: A Little Help from another person bathing (including washing, rinsing, drying)?: A Little Help from another person to put on and taking off regular upper body clothing?: A Little Help  from another person to put on and taking off regular lower body clothing?: A Little 6 Click Score: 19    End of Session Equipment Utilized During Treatment: Other (comment) (sock aide, reacher, long handled shoe horn, long handled sponge)  OT Visit Diagnosis: Other abnormalities of gait and mobility (R26.89);Other (comment);Pain (decreased activity tolerance)   Activity Tolerance Patient tolerated treatment well   Patient Left in chair;with call bell/phone within reach;with nursing/sitter in room   Nurse Communication Mobility status;Other (comment) (OT edcuated pt in use of AE for ADLs)        Time: 8394-8373 OT Time Calculation (min): 21 min  Charges: OT General Charges $OT Visit: 1 Visit OT Treatments $Self Care/Home Management : 8-22 mins  Margarie Rockey HERO., OTR/L, MA Acute Rehab [phone]   Margarie FORBES Horns 10/18/2023, 4:55 PM

## 2023-10-18 NOTE — Progress Notes (Signed)
    3 Days Post-Op  Subjective: Eating, but not much yet.  Passing gas, but no BM yet.  Having trouble getting out of bed, but doing ok once up.     Objective: Vital signs in last 24 hours: Temp:  [98.3 F (36.8 C)-98.7 F (37.1 C)] 98.4 F (36.9 C) (07/11 0749) Pulse Rate:  [91-98] 93 (07/11 0749) Resp:  [16-18] 16 (07/11 0749) BP: (123-157)/(63-82) 145/67 (07/11 0749) SpO2:  [92 %-99 %] 94 % (07/11 0749) Last BM Date : 10/15/23  Intake/Output from previous day: 07/10 0701 - 07/11 0700 In: 240 [P.O.:240] Out: -  Intake/Output this shift: Total I/O In: 440 [P.O.:440] Out: -   PE: General: in chair.   Neuro: alert and oriented, no focal deficits Resp: normal work of breathing on room air Abdomen: soft, nondistended, appropriately tender to palpation. R subcostal incision clean and dry with no erythema, induration or drainage. Extremities: warm and well-perfused   Lab Results:  Recent Labs    10/16/23 0535 10/17/23 0815  WBC 11.8* 11.0*  HGB 10.3* 8.8*  HCT 31.0* 26.8*  PLT 244 236   BMET Recent Labs    10/16/23 0535 10/17/23 0815  NA 133* 138  K 4.7 3.8  CL 102 104  CO2 21* 25  GLUCOSE 207* 181*  BUN 16 13  CREATININE 1.11* 0.95  CALCIUM  7.9* 8.3*   PT/INR No results for input(s): LABPROT, INR in the last 72 hours. CMP     Component Value Date/Time   NA 138 10/17/2023 0815   NA 136 05/29/2021 1318   K 3.8 10/17/2023 0815   CL 104 10/17/2023 0815   CO2 25 10/17/2023 0815   GLUCOSE 181 (H) 10/17/2023 0815   BUN 13 10/17/2023 0815   BUN 7 05/29/2021 1318   CREATININE 0.95 10/17/2023 0815   CREATININE 0.75 10/03/2023 1248   CALCIUM  8.3 (L) 10/17/2023 0815   PROT 5.6 (L) 10/17/2023 0815   PROT 7.4 05/29/2021 1318   ALBUMIN  2.6 (L) 10/17/2023 0815   ALBUMIN  4.1 05/29/2021 1318   AST 154 (H) 10/17/2023 0815   AST 15 10/03/2023 1248   ALT 198 (H) 10/17/2023 0815   ALT 14 10/03/2023 1248   ALKPHOS 52 10/17/2023 0815   BILITOT 0.5  10/17/2023 0815   BILITOT 0.4 10/03/2023 1248   GFRNONAA >60 10/17/2023 0815   GFRNONAA >60 10/03/2023 1248   GFRAA >60 02/24/2019 1647   Lipase     Component Value Date/Time   LIPASE 22 02/24/2019 1647         Assessment/Plan 58 yo female with colon cancer with metastatic disease to colon, s/p open hepatic metastasectomy x2 7/8. - Regular diet  - Multimodal pain control: scheduled tylenol  and robaxin , prn oxycodone  and dilaudid  - T2DM: home glipizide  and metformin  on hold, continue sliding scale insulin  while inpatient - Mobilize. Ambulating independently in room. Cleared by PT. - VTE: lovenox , SCDs Incentive spirometry  - Dispo: med-surg floor. Anticipate discharge in next 1-2 days if pain controlled and mobilizing without difficulty. No BM yet. Add miralax  to daily bowel regimen.     LOS: 3 days   Jina LITTIE Nephew, MD, FACS, FSSO Surgical Oncology, General Surgery, Trauma and Critical [facility] Surgery, [place] [phone] for weekday/non holidays Check [email or web address] for coverage night/weekend/holidays

## 2023-10-18 NOTE — Plan of Care (Signed)

## 2023-10-19 LAB — TYPE AND SCREEN
ABO/RH(D): A POS
Antibody Screen: NEGATIVE
Unit division: 0
Unit division: 0
Unit division: 0
Unit division: 0
Unit division: 0
Unit division: 0
Unit division: 0
Unit division: 0

## 2023-10-19 LAB — BPAM RBC
Blood Product Expiration Date: 202508052359
Blood Product Expiration Date: 202508052359
Blood Product Expiration Date: 202508052359
Blood Product Expiration Date: 202508062359
Blood Product Expiration Date: 202508062359
Blood Product Expiration Date: 202508082359
Blood Product Expiration Date: 202508082359
Blood Product Expiration Date: 202508082359
ISSUE DATE / TIME: 202507081301
ISSUE DATE / TIME: 202507081301
ISSUE DATE / TIME: 202507081301
ISSUE DATE / TIME: 202507081301
ISSUE DATE / TIME: 202507081335
ISSUE DATE / TIME: 202507081335
ISSUE DATE / TIME: 202507081335
ISSUE DATE / TIME: 202507090836
Unit Type and Rh: 6200
Unit Type and Rh: 6200
Unit Type and Rh: 6200
Unit Type and Rh: 6200
Unit Type and Rh: 6200
Unit Type and Rh: 6200
Unit Type and Rh: 6200
Unit Type and Rh: 6200

## 2023-10-19 LAB — GLUCOSE, CAPILLARY
Glucose-Capillary: 210 mg/dL — ABNORMAL HIGH (ref 70–99)
Glucose-Capillary: 183 mg/dL — ABNORMAL HIGH (ref 70–99)
Glucose-Capillary: 291 mg/dL — ABNORMAL HIGH (ref 70–99)
Glucose-Capillary: 200 mg/dL — ABNORMAL HIGH (ref 70–99)

## 2023-10-19 NOTE — Progress Notes (Signed)
    4 Days Post-Op  Subjective: Eating better.  Passing gas, had a BM.  Having trouble getting out of bed, but doing ok once up.     Objective: Vital signs in last 24 hours: Temp:  [98.4 F (36.9 C)-99 F (37.2 C)] 98.5 F (36.9 C) (07/12 0748) Pulse Rate:  [79-95] 95 (07/12 0748) Resp:  [17-18] 17 (07/12 0748) BP: (137-154)/(72-88) 154/88 (07/12 0748) SpO2:  [96 %-99 %] 99 % (07/12 0748) Last BM Date : 10/15/23  Intake/Output from previous day: 07/11 0701 - 07/12 0700 In: 660 [P.O.:660] Out: -  Intake/Output this shift: Total I/O In: 10 [I.V.:10] Out: -   PE: General: in chair.   Neuro: alert and oriented, no focal deficits Resp: normal work of breathing on room air Abdomen: soft, nondistended, appropriately tender to palpation. R subcostal incision clean and dry with no erythema, induration or drainage. Extremities: warm and well-perfused   Lab Results:  Recent Labs    10/17/23 0815  WBC 11.0*  HGB 8.8*  HCT 26.8*  PLT 236   BMET Recent Labs    10/17/23 0815  NA 138  K 3.8  CL 104  CO2 25  GLUCOSE 181*  BUN 13  CREATININE 0.95  CALCIUM  8.3*   PT/INR No results for input(s): LABPROT, INR in the last 72 hours. CMP     Component Value Date/Time   NA 138 10/17/2023 0815   NA 136 05/29/2021 1318   K 3.8 10/17/2023 0815   CL 104 10/17/2023 0815   CO2 25 10/17/2023 0815   GLUCOSE 181 (H) 10/17/2023 0815   BUN 13 10/17/2023 0815   BUN 7 05/29/2021 1318   CREATININE 0.95 10/17/2023 0815   CREATININE 0.75 10/03/2023 1248   CALCIUM  8.3 (L) 10/17/2023 0815   PROT 5.6 (L) 10/17/2023 0815   PROT 7.4 05/29/2021 1318   ALBUMIN  2.6 (L) 10/17/2023 0815   ALBUMIN  4.1 05/29/2021 1318   AST 154 (H) 10/17/2023 0815   AST 15 10/03/2023 1248   ALT 198 (H) 10/17/2023 0815   ALT 14 10/03/2023 1248   ALKPHOS 52 10/17/2023 0815   BILITOT 0.5 10/17/2023 0815   BILITOT 0.4 10/03/2023 1248   GFRNONAA >60 10/17/2023 0815   GFRNONAA >60 10/03/2023 1248    GFRAA >60 02/24/2019 1647   Lipase     Component Value Date/Time   LIPASE 22 02/24/2019 1647         Assessment/Plan 58 yo female with colon cancer with metastatic disease to colon, s/p open hepatic metastasectomy x2 7/8. - Regular diet  - Multimodal pain control: scheduled tylenol  and robaxin , prn oxycodone  and dilaudid  - T2DM: home glipizide  and metformin  on hold, continue sliding scale insulin  while inpatient - Mobilize. Ambulating independently in room. Cleared by PT. - VTE: lovenox , SCDs Incentive spirometry  - Dispo: med-surg floor. Anticipate discharge tomorrow if pain controlled and mobilizing with less difficulty.    LOS: 4 days   Bernarda JAYSON Ned, MD  Colorectal and General Surgery [facility] Surgery   [phone] for weekday/non holidays Check [email or web address] for coverage night/weekend/holidays

## 2023-10-19 NOTE — Plan of Care (Signed)
  Problem: Education: Goal: Knowledge of General Education information will improve Description: Including pain rating scale, medication(s)/side effects and non-pharmacologic comfort measures Outcome: Progressing   Problem: Coping: Goal: Level of anxiety will decrease Outcome: Progressing   Problem: Pain Managment: Goal: General experience of comfort will improve and/or be controlled Outcome: Progressing

## 2023-10-19 NOTE — Plan of Care (Signed)
  Problem: Pain Managment: Goal: General experience of comfort will improve and/or be controlled Outcome: Progressing   Problem: Safety: Goal: Ability to remain free from injury will improve Outcome: Progressing   Problem: Coping: Goal: Ability to adjust to condition or change in health will improve Outcome: Progressing   Problem: Skin Integrity: Goal: Risk for impaired skin integrity will decrease Outcome: Progressing

## 2023-10-20 LAB — GLUCOSE, CAPILLARY: Glucose-Capillary: 191 mg/dL — ABNORMAL HIGH (ref 70–99)

## 2023-10-20 NOTE — Progress Notes (Signed)
 DISCHARGE NOTE HOME [place] to be discharged Home per MD order. Discussed prescriptions and follow up appointments with the patient. Prescriptions given to patient; medication list explained in detail. Patient verbalized understanding.  Skin clean, dry and intact without evidence of skin break down, no evidence of skin tears noted. IV catheter discontinued intact. Site without signs and symptoms of complications. Dressing and pressure applied. Pt denies pain at the site currently. No complaints noted.  Has port and incisions at discharge Patient free of other lines, drains, and wounds.   An After Visit Summary (AVS) was printed and given to the patient. Patient escorted via wheelchair, and discharged home via private auto.  Peyton SHAUNNA Pepper, RN

## 2023-10-20 NOTE — TOC Transition Note (Signed)
 Transition of Care [facility]) - Discharge Note   Patient Details  Name: Tammy Marks MRN: [id number] Date of Birth: 1965/10/27  Transition of [facility]) CM/SW Contact:  Robynn Eileen Hoose, RN Phone Number: 10/20/2023, 9:19 AM   Clinical Narrative:  Patient is being discharged home today. Spoke with patient confirmed need for BSC/3:1 as her toilet is too low to the ground. BSC/3:1 ordered through Jermaine with [employer].    Final next level of care: Home/Self Care Barriers to Discharge: No Barriers Identified   Patient Goals and CMS Choice            Discharge Placement                       Discharge Plan and Services Additional resources added to the After Visit Summary for                  DME Arranged: Bedside commode, 3-N-1 DME Agency: [employer] Date DME Agency Contacted: 10/20/23 Time DME Agency Contacted: [phone] Representative spoke with at DME Agency: [place]            Social Drivers of Health (SDOH) Interventions SDOH Screenings   Food Insecurity: No Food Insecurity (10/15/2023)  Housing: High Risk (10/15/2023)  Transportation Needs: No Transportation Needs (10/15/2023)  Utilities: Not At Risk (10/15/2023)  Depression (PHQ2-9): Low Risk  (10/03/2023)  Social Connections: Moderately Isolated (05/03/2023)  Tobacco Use: Low Risk  (10/15/2023)     Readmission Risk Interventions     No data to display

## 2023-10-20 NOTE — Care Management (Cosign Needed)
    Durable Medical Equipment  (From admission, onward)           Start     Ordered   10/20/23 0918  For home use only DME 3 n 1  Once        10/20/23 0918   10/20/23 0918  For home use only DME Bedside commode  Once       Question:  Patient needs a bedside commode to treat with the following condition  Answer:  Weakness   10/20/23 0918           Per OT recommendations.

## 2023-10-20 NOTE — Plan of Care (Signed)
  Problem: Clinical Measurements: Goal: Respiratory complications will improve Outcome: Progressing   Problem: Activity: Goal: Risk for activity intolerance will decrease Outcome: Progressing   Problem: Coping: Goal: Level of anxiety will decrease Outcome: Progressing   Problem: Safety: Goal: Ability to remain free from injury will improve Outcome: Progressing   

## 2023-10-20 NOTE — Discharge Summary (Signed)
 Physician Discharge Summary  Patient ID: Tammy Marks MRN: [id number] DOB/AGE: July 31, 1965 58 y.o.  Admit date: 10/15/2023 Discharge date: 10/20/2023  Admission Diagnoses:  Discharge Diagnoses:  Principal Problem:   Malignant neoplasm [facility])   Discharged Condition: good  Hospital Course: Patient was admitted to the progressive care unit overnight and then transferred to the med surg floor the morning after surgery.  Diet was advanced as tolerated. Post operative labs were appropriate. Patient began to have bowel function on postop day 3.  By postop day 5, she was tolerating a solid diet and pain was controlled with oral medications.  She was urinating without difficulty and ambulating without assistance.  Patient was felt to be in stable condition for discharge to home.   Consults: None  Significant Diagnostic Studies: labs: cbc, cmet  Treatments: IV hydration, analgesia: acetaminophen  and oxycodone , and surgery: Open right hepatic metastasectomy x2 (segment 7, segment 8)   Discharge Exam: Blood pressure (!) 150/83, pulse 97, temperature 99 F (37.2 C), temperature source Oral, resp. rate 16, height 5' 5 (1.651 m), weight 127.2 kg, last menstrual period 06/20/2020, SpO2 94%. General appearance: alert and cooperative GI: soft, appropriately TTP Incision/Wound: C/D/I  Disposition: Discharge disposition: 01-Home or Self Care        Allergies as of 10/20/2023       Reactions   Oxaliplatin  Itching, Other (See Comments), Cough   Itchy lips, coughing, need to burp feeling - please see note from 03/28/2023        Medication List     TAKE these medications    acetaminophen  500 MG tablet Commonly known as: TYLENOL  Take 2 tablets (1,000 mg total) by mouth every 8 (eight) hours as needed for mild pain (pain score 1-3).   atorvastatin  20 MG tablet Commonly known as: LIPITOR Take 20 mg by mouth daily.   BERBERINE HCI PO Take 1 tablet by mouth daily.   docusate  sodium 100 MG capsule Commonly known as: COLACE Take 1 capsule (100 mg total) by mouth 2 (two) times daily.   glipiZIDE  10 MG 24 hr tablet Commonly known as: GLUCOTROL  XL Take 10 mg by mouth daily.   Iron  (Ferrous Sulfate ) 325 (65 Fe) MG Tabs Take 325 mg by mouth daily.   metFORMIN  500 MG 24 hr tablet Commonly known as: GLUCOPHAGE -XR Take 1,000 mg by mouth 2 (two) times daily.   Methocarbamol  1000 MG Tabs Take 1,000 mg by mouth every 8 (eight) hours as needed for muscle spasms.   oxyCODONE  5 MG immediate release tablet Commonly known as: Oxy IR/ROXICODONE  Take 1-2 tablets (5-10 mg total) by mouth every 6 (six) hours as needed for up to 5 days (5mg  for moderate pain, 10mg  for severe pain).   polyethylene glycol 17 g packet Commonly known as: MIRALAX  / GLYCOLAX  Take 17 g by mouth daily as needed for mild constipation.   potassium chloride  SA 20 MEQ tablet Commonly known as: KLOR-CON  M Take 1 tablet (20 mEq total) by mouth 2 (two) times daily. Take 1 tablet 3 times daily for 3 days, then twice daily What changed:  when to take this additional instructions   VITAMIN C PO Take 1 tablet by mouth daily.   VITAMIN D PO Take 1 tablet by mouth daily.        Follow-up Information     Tammy Leonor CROME, MD. Schedule an appointment as soon as possible for a visit in 2 week(s).   Specialty: General Surgery Contact information: [address] [place] [address] [phone]                 Signed: [name] 10/20/2023, 8:59 AM

## 2023-10-23 ENCOUNTER — Telehealth: Payer: Self-pay

## 2023-10-23 ENCOUNTER — Other Ambulatory Visit: Payer: Self-pay

## 2023-10-23 NOTE — Telephone Encounter (Signed)
 Pt called stating she was recently d/c from the hospital on 10/20/2023 RM#[id number].  Pt stated she was d/c home from the hospital on 10/20/2023 with her port still accessed.  Pt stated she contacted the hospital and the surgeon's office regarding this matter and was told to go to the ED to have the port deaccessed or contact Dr. Demetra office to have the port deaccessed.  Pt stated she's not scheduled to return to see Dr. Lanny until August 2025 and does not think the port is supposed to remain accessed through this time.  Pt was extremely frustrated due to this matter and feel she should not have to suffer for someone else's mistake.  Scheduled pt an appt on 10/24/2023 to have the pt's port deaccessed here at the [facility].  Pt was very thankful for the assistance given.

## 2023-10-24 ENCOUNTER — Inpatient Hospital Stay

## 2023-10-24 ENCOUNTER — Other Ambulatory Visit: Payer: Self-pay

## 2023-11-07 ENCOUNTER — Telehealth: Payer: Self-pay | Admitting: Hematology

## 2023-11-07 NOTE — Telephone Encounter (Signed)
 Rescheduled appointment per incoming call from the patient. Talked with the patient and she is aware of the changes made to her upcoming appointments.

## 2023-11-11 ENCOUNTER — Other Ambulatory Visit: Payer: Self-pay

## 2023-11-11 ENCOUNTER — Other Ambulatory Visit: Payer: Self-pay | Admitting: Nurse Practitioner

## 2023-11-11 DIAGNOSIS — D649 Anemia, unspecified: Secondary | ICD-10-CM

## 2023-11-11 DIAGNOSIS — C186 Malignant neoplasm of descending colon: Secondary | ICD-10-CM

## 2023-11-11 NOTE — Assessment & Plan Note (Signed)
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  The left descending colon cancer was only 4 mm, removed by polypectomy. The largest 3 cm, fungating mass in the rectosigmoid junction is 15 cm from anal verge on colonoscopy, biopsy confirmed moderate differentiated adenocarcinoma -I reviewed her staging CT chest from November 18, 2022, which showed no definitive evidence of metastasis, but showed borderline enlarged thoracic adenopathy.  I will obtain a PET scan for further evaluation. -Her liver MRI unfortunately showed a 1.2 cm mass in the posterior dome of liver, segment 7, biopsy on 12/03/22 confirmed metastasis from colon cancer, I reviewed with her.  -Given her metastatic disease, I recommend chemotherapy first.  She started FOLFOX on 12/18/2022 and received 8 cycles before surgery -she underwent hemicolectomy on 05/03/23 and liver ablation on 05/22/23 by IR  -surgical path reviewed, ypT3N1b, surgical margins negative  -I recommend 2 more months adjuvant chemo FOLFOX.  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  She started Xeloda  on June 17, 2023 and completed in early May 2025 -Signatera was positive on 08/13/2023 and CT 09/03/2023 showed enlarged liver met. PET 09/17/2023 showed 2 hypermetabolic liver lesion, consistent with metastasis.  There is also a mild hypermetabolism at the surgical site in the sigmoid colon, I recommended repeating colonoscopy. -will refer her to surgery for liver resection  - 10/15/2023 -partial right hepatectomy with  deep margins negative for carcinoma.

## 2023-11-11 NOTE — Progress Notes (Unsigned)
 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  The left descending colon cancer was only 4 mm, removed by polypectomy. The largest 3 cm, fungating mass in the rectosigmoid junction is 15 cm from anal verge on colonoscopy, biopsy confirmed moderate differentiated adenocarcinoma -Her staging CT chest from November 18, 2022 was reviewed, which showed no definitive evidence of metastasis, but showed borderline enlarged thoracic adenopathy. A PET scan was obtained for further evaluation. -Her liver MRI unfortunately showed a 1.2 cm mass in the posterior dome of liver, segment 7, biopsy on 12/03/22 confirmed metastasis from colon cancer, This was reviewed with her.  -Given her metastatic disease, chemotherapy was recommended prior to surgery. She started FOLFOX on 12/18/2022 and received 8 cycles before surgery -she underwent hemicolectomy on 05/03/23 and liver ablation on 05/22/23 by IR  -surgical path reviewed, ypT3N1b, surgical margins negative  -2 more months adjuvant chemo FOLFOX were recommended.  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  She started Xeloda  on June 17, 2023 and completed in early May 2025 -Signatera was positive on 08/13/2023 and CT 09/03/2023 showed enlarged liver met. PET 09/17/2023 showed 2 hypermetabolic liver lesion, consistent with metastasis.  There is also a mild hypermetabolism at the surgical site in the sigmoid colon, Repeat  colonoscopy is recommended. It is scheduled for 11/14/2023.  - 10/15/2023 -partial right hepatectomy with  deep margins negative for carcinoma. -10/29/2023 -repeat Signatera ctDNA drawn at patient's home.  We are awaiting results. - Colonoscopy scheduled on 11/14/2023. -Repeat CT CAP will be scheduled for mid September 2025. -She will follow-up with Dr. Dasie in late August 2025. -Labs/flush and follow-up in 2 months to review.    Liver metastectomy 10/15/2023 Surgery done by Dr. Dasie.  Healing well.  Has tenderness along surgical incision.  Having low back pain.  Alternating Tylenol  and ibuprofen to manage pain.  Taking Robaxin  as needed for muscle pain.  States this is not very effective.  Having trouble sleeping.  Discussed alternative muscle relaxer and/or short-term prescription for tramadol .  Patient declined.  Recommend use of heating pad for low back pain.   Metastatic colon cancer Previous diagnosis of stage IV colon cancer with metastases to the liver.  She initially received 8 cycles of chemotherapy FOLFOX.  This was followed by hemicolectomy and liver ablation.  Additional 2 months chemotherapy FOLFOX were recommended.  She developed significant neuropathy and had to stop chemotherapy with FOLFOX.  She was switched to Xeloda  which she completed in May 2025.  On 08/13/2023, she had positive Signatera test for ctDNA.  CT CAP showed enlarging liver metastases.  PET scan was positive for 2 liver lesions, consistent with metastatic disease.  There was also a small area of hypermetabolic activity at the anastomosis and surgical site.  She underwent liver metastasectomy.  She is healing well.  She had repeat Signatera test for ctDNA on 10/29/2023.  Results are pending.  She is scheduled for colonoscopy on Thursday, 11/14/2023.  Will schedule CT CAP in mid September and follow-up  with her 1 to 2 weeks following CT scan.  She understands we will continue to monitor her closely, every 3 months, for first 2 to 3  years.  After that, we will follow-up every 6 months for at least 5 years.  Plan Labs reviewed. - Improving anemia with Hgb 10.1 and HCT 37.3. - CMP unremarkable other than blood sugar 131. - CEA pending. -Awaiting results of repeat Signatera ctDNA Colonoscopy scheduled for 11/14/2023. Follow-up with Dr. Dasie in late August 2025. Scheduled for CT CAP in mid September 2025. Follow-up in 2 months (1 to 2 weeks after CT), sooner if needed. This visit was shared with Dr. Lanny.   The patient understands the plans discussed today and is in agreement with them.  She knows to contact our office if she develops concerns prior to her next appointment.  I provided 25 minutes of face-to-face time during this encounter and > 50% was spent counseling as documented under my assessment and plan.    Tammy FORBES Lessen, Tammy Marks  [facility] - A DEPT OF [facility] [address] [place] [address] Dept: [phone] Dept Fax: [phone]   Orders Placed This Encounter  Procedures   CT CHEST ABDOMEN PELVIS W CONTRAST    Standing Status:   Future    Expected Date:   12/24/2023    Expiration Date:   11/11/2024    If indicated for the ordered procedure, I authorize the administration of contrast media per Radiology protocol:   Yes    Does the patient have a contrast media/X-ray dye allergy?:   No    If indicated for the ordered procedure, I authorize the administration of oral contrast media per Radiology protocol:   Yes    Preferred imaging location?:   [facility]      CHIEF COMPLAINT:  CC: Follow-up colon cancer  Current Treatment: Surgical intervention and observation  INTERVAL HISTORY:  Tammy Marks is here today for repeat clinical assessment.  She last saw Dr. Lanny on 10/03/2023.  She had open hepatic metastasectomy x2 on 10/15/2023.  Deep margins were negative for carcinoma.  She had follow-up visit with Dr. Dasie on 11/05/2023.   The patient states she is doing well overall.  Healing from surgery.  Still on FMLA leave.  Her appetite is improving.  She is scheduled for colonoscopy 11/14/2023.  She does have some abdominal and mid back discomfort.  Making it difficult to sleep.  She is taking muscle relaxer, Robaxin , and alternating Tylenol  and ibuprofen to help pain.  She does not think these medications are really helping that much.  She does not want to add new medications either.  She does have manageable neuropathy.  She denies chest pain, chest pressure, or shortness of breath. She denies headaches or visual disturbances.  She denies nausea or vomiting.  She has mostly loose stools.  Denies presence of blood in her stool. She denies fevers or chills.Her weight has decreased 10 pounds over last month.  I have reviewed the past medical history, past surgical history, social history and family history with the patient and they are unchanged from previous note.  ALLERGIES:  is allergic to oxaliplatin .  MEDICATIONS:  Current Outpatient Medications  Medication Sig Dispense Refill   acetaminophen  (TYLENOL ) 500 MG tablet Take 2 tablets (1,000 mg total) by mouth every 8 (eight) hours as needed for mild pain (pain score 1-3).     Ascorbic Acid (  VITAMIN C PO) Take 1 tablet by mouth daily.     atorvastatin  (LIPITOR) 20 MG tablet Take 20 mg by mouth daily.     Berberine Chloride (BERBERINE HCI PO) Take 1 tablet by mouth daily.     docusate sodium  (COLACE) 100 MG capsule Take 1 capsule (100 mg total) by mouth 2 (two) times daily. 60 capsule 0   glipiZIDE  (GLUCOTROL  XL) 10 MG 24 hr tablet Take 10 mg by mouth daily.     Iron , Ferrous Sulfate , 325 (65 Fe) MG TABS Take 325 mg by mouth daily. 30 tablet 11   metFORMIN  (GLUCOPHAGE -XR) 500 MG 24 hr tablet Take 1,000 mg by mouth 2 (two) times daily.     methocarbamol  1000 MG TABS Take 1,000 mg by mouth every 8 (eight) hours as needed for muscle spasms. 25 tablet 0   polyethylene glycol  (MIRALAX  / GLYCOLAX ) 17 g packet Take 17 g by mouth daily as needed for mild constipation.     potassium chloride  SA (KLOR-CON  M) 20 MEQ tablet Take 1 tablet (20 mEq total) by mouth 2 (two) times daily. Take 1 tablet 3 times daily for 3 days, then twice daily (Patient taking differently: Take 20 mEq by mouth daily.) 60 tablet 1   VITAMIN D PO Take 1 tablet by mouth daily.     No current facility-administered medications for this visit.    HISTORY OF PRESENT ILLNESS:   Oncology History  Cancer of left colon (HCC)  11/21/2022 Initial Diagnosis   Cancer of left colon (HCC)   12/18/2022 -  Chemotherapy   Patient is on Treatment Plan : COLORECTAL FOLFOX q14d     12/26/2022 PET scan   NM PET skull base to thigh  IMPRESSION: 1. Two hypermetabolic liver lesions consistent with metastatic disease. 2. Small hypermetabolic celiac axis and sigmoid mesocolon nodes suggesting metastatic adenopathy. 3. No findings for metastatic disease involving the chest or bony structures. 4. Diffuse hypermetabolism throughout the colon possibly related to recently eating or taking insulin  making it difficult to identified the patient's sigmoid cancer.   05/03/2023 Cancer Staging   Staging form: Colon and Rectum, AJCC 8th Edition - Pathologic stage from 05/03/2023: Stage IVA (pT3, pN1b, pM1a) - Signed by Lanny Callander, MD on 06/04/2023 Histologic grading system: 4 grade system Histologic grade (G): G2 Residual tumor (R): R0       REVIEW OF SYSTEMS:   Constitutional: Denies fevers, chills or abnormal weight loss. Fatigue which is gradually imrproving.  Eyes: Denies blurriness of vision Ears, nose, mouth, throat, and face: Denies mucositis or sore throat Respiratory: Denies cough, dyspnea or wheezes Cardiovascular: Denies palpitation, chest discomfort or lower extremity swelling Gastrointestinal:  Denies nausea, heartburn or change in bowel habits.  She does have some abdominal pain along surgical site. Skin:  Denies abnormal skin rashes Lymphatics: Denies new lymphadenopathy or easy bruising Neurological:Denies numbness, tingling or new weaknesses Behavioral/Psych: Mood is stable, no new changes  All other systems were reviewed with the patient and are negative.   VITALS:   Today's Vitals   11/12/23 0833 11/12/23 0839  BP:  (!) 141/89  Pulse:  (!) 101  Resp:  16  Temp:  (!) 97.2 F (36.2 C)  TempSrc:  Temporal  SpO2:  100%  Weight:  270 lb 12.8 oz (122.8 kg)  PainSc: 6     Body mass index is 45.06 kg/m.   Wt Readings from Last 3 Encounters:  11/12/23 270 lb 12.8 oz (122.8 kg)  10/15/23 280 lb  6.4 oz (127.2 kg)  10/10/23 280 lb 6.4 oz (127.2 kg)    Body mass index is 45.06 kg/m.  Performance status (ECOG): 1 - Symptomatic but completely ambulatory  PHYSICAL EXAM:   GENERAL:alert, no distress and comfortable SKIN: skin color, texture, turgor are normal, no rashes or significant lesions EYES: normal, Conjunctiva are pink and non-injected, sclera clear OROPHARYNX:no exudate, no erythema and lips, buccal mucosa, and tongue normal  NECK: supple, thyroid normal size, non-tender, without nodularity LYMPH:  no palpable lymphadenopathy in the cervical, axillary or inguinal LUNGS: clear to auscultation and percussion with normal breathing effort HEART: regular rate & rhythm and no murmurs and no lower extremity edema ABDOMEN:abdomen soft with normal bowel sounds. There is abdominal tenderness along large mid abdominal scar.  Scar does appear to be healing nicely without evidence of infection or inflammation. Musculoskeletal:no cyanosis of digits and no clubbing  NEURO: alert & oriented x 3 with fluent speech, no focal motor/sensory deficits  LABORATORY DATA:  I have reviewed the data as listed    Component Value Date/Time   NA 139 11/12/2023 0757   NA 136 05/29/2021 1318   K 4.1 11/12/2023 0757   CL 102 11/12/2023 0757   CO2 26 11/12/2023 0757   GLUCOSE 131 (H) 11/12/2023  0757   BUN 19 11/12/2023 0757   BUN 7 05/29/2021 1318   CREATININE 0.82 11/12/2023 0757   CALCIUM  9.5 11/12/2023 0757   PROT 8.1 11/12/2023 0757   PROT 7.4 05/29/2021 1318   ALBUMIN  4.0 11/12/2023 0757   ALBUMIN  4.1 05/29/2021 1318   AST 13 (L) 11/12/2023 0757   ALT 13 11/12/2023 0757   ALKPHOS 86 11/12/2023 0757   BILITOT 0.3 11/12/2023 0757   GFRNONAA >60 11/12/2023 0757   GFRAA >60 02/24/2019 1647    Lab Results  Component Value Date   WBC 8.9 11/12/2023   NEUTROABS 4.7 11/12/2023   HGB 10.1 (L) 11/12/2023   HCT 31.3 (L) 11/12/2023   MCV 79.2 (L) 11/12/2023   PLT 427 (H) 11/12/2023   Addendum I have seen the patient, examined her. I agree with the assessment and and plan and have edited the notes.   Tammy Marks is recovering well from surgery.  I reviewed her surgical pathology, which showed complete resected liver metastasis (2 foci) with negative margins.  I did not recommend adjuvant chemotherapy given her recent chemo.  I recommend close monitoring with CT DNA every 3 months, and CT scan every 3 months.  I again discussed the high risk of recurrence in the future, and try to catch recurrence earlier with close monitoring.  Patient voiced good understanding and agrees with the plan.  All questions were answered.  Tammy Marks  11/12/2023

## 2023-11-12 ENCOUNTER — Inpatient Hospital Stay: Attending: Hematology

## 2023-11-12 ENCOUNTER — Inpatient Hospital Stay: Attending: Hematology | Admitting: Nurse Practitioner

## 2023-11-12 VITALS — BP 141/89 | HR 101 | Temp 97.2°F | Resp 16 | Wt 270.8 lb

## 2023-11-12 DIAGNOSIS — G620 Drug-induced polyneuropathy: Secondary | ICD-10-CM | POA: Insufficient documentation

## 2023-11-12 DIAGNOSIS — C186 Malignant neoplasm of descending colon: Secondary | ICD-10-CM | POA: Insufficient documentation

## 2023-11-12 DIAGNOSIS — C787 Secondary malignant neoplasm of liver and intrahepatic bile duct: Secondary | ICD-10-CM | POA: Insufficient documentation

## 2023-11-12 DIAGNOSIS — T451X5A Adverse effect of antineoplastic and immunosuppressive drugs, initial encounter: Secondary | ICD-10-CM | POA: Insufficient documentation

## 2023-11-12 DIAGNOSIS — Z95828 Presence of other vascular implants and grafts: Secondary | ICD-10-CM

## 2023-11-12 DIAGNOSIS — D649 Anemia, unspecified: Secondary | ICD-10-CM

## 2023-11-12 LAB — CBC WITH DIFFERENTIAL (CANCER CENTER ONLY)
Abs Immature Granulocytes: 0.03 K/uL (ref 0.00–0.07)
Basophils Absolute: 0 K/uL (ref 0.0–0.1)
Basophils Relative: 1 %
Eosinophils Absolute: 0.2 K/uL (ref 0.0–0.5)
Eosinophils Relative: 2 %
HCT: 31.3 % — ABNORMAL LOW (ref 36.0–46.0)
Hemoglobin: 10.1 g/dL — ABNORMAL LOW (ref 12.0–15.0)
Immature Granulocytes: 0 %
Lymphocytes Relative: 35 %
Lymphs Abs: 3.1 K/uL (ref 0.7–4.0)
MCH: 25.6 pg — ABNORMAL LOW (ref 26.0–34.0)
MCHC: 32.3 g/dL (ref 30.0–36.0)
MCV: 79.2 fL — ABNORMAL LOW (ref 80.0–100.0)
Monocytes Absolute: 0.7 K/uL (ref 0.1–1.0)
Monocytes Relative: 8 %
Neutro Abs: 4.7 K/uL (ref 1.7–7.7)
Neutrophils Relative %: 54 %
Platelet Count: 427 K/uL — ABNORMAL HIGH (ref 150–400)
RBC: 3.95 MIL/uL (ref 3.87–5.11)
RDW: 14.6 % (ref 11.5–15.5)
WBC Count: 8.9 K/uL (ref 4.0–10.5)
nRBC: 0 % (ref 0.0–0.2)

## 2023-11-12 LAB — CMP (CANCER CENTER ONLY)
ALT: 13 U/L (ref 0–44)
AST: 13 U/L — ABNORMAL LOW (ref 15–41)
Albumin: 4 g/dL (ref 3.5–5.0)
Alkaline Phosphatase: 86 U/L (ref 38–126)
Anion gap: 11 (ref 5–15)
BUN: 19 mg/dL (ref 6–20)
CO2: 26 mmol/L (ref 22–32)
Calcium: 9.5 mg/dL (ref 8.9–10.3)
Chloride: 102 mmol/L (ref 98–111)
Creatinine: 0.82 mg/dL (ref 0.44–1.00)
GFR, Estimated: >60 mL/min (ref >60)
Glucose, Bld: 131 mg/dL — ABNORMAL HIGH (ref 70–99)
Potassium: 4.1 mmol/L (ref 3.5–5.1)
Sodium: 139 mmol/L (ref 135–145)
Total Bilirubin: 0.3 mg/dL (ref 0.0–1.2)
Total Protein: 8.1 g/dL (ref 6.5–8.1)

## 2023-11-12 LAB — FERRITIN: Ferritin: 97 ng/mL (ref 11–307)

## 2023-11-12 LAB — CEA (ACCESS): CEA (CHCC): <1 ng/mL (ref 0.00–5.00)

## 2023-11-12 MED ORDER — SODIUM CHLORIDE 0.9% FLUSH
10.0000 mL | Freq: Once | INTRAVENOUS | Status: AC
  Administered 2023-11-12: 10 mL

## 2023-11-13 ENCOUNTER — Telehealth: Payer: Self-pay | Admitting: Nurse Practitioner

## 2023-11-13 NOTE — Telephone Encounter (Signed)
 Left the patient a voicemail with the appointment details.

## 2023-11-14 ENCOUNTER — Other Ambulatory Visit: Payer: Self-pay

## 2023-11-18 ENCOUNTER — Other Ambulatory Visit

## 2023-11-18 ENCOUNTER — Ambulatory Visit: Admitting: Hematology

## 2023-11-26 ENCOUNTER — Encounter: Payer: Self-pay | Admitting: Internal Medicine

## 2023-12-04 ENCOUNTER — Other Ambulatory Visit: Payer: Self-pay

## 2023-12-06 ENCOUNTER — Other Ambulatory Visit: Payer: Self-pay | Admitting: Hematology

## 2023-12-10 ENCOUNTER — Encounter: Payer: Self-pay | Admitting: Hematology

## 2023-12-30 ENCOUNTER — Ambulatory Visit (HOSPITAL_COMMUNITY)
Admission: RE | Admit: 2023-12-30 | Discharge: 2023-12-30 | Disposition: A | Discharge status: Home / Self Care | Source: Ambulatory Visit | Attending: Nurse Practitioner | Admitting: Nurse Practitioner

## 2023-12-30 DIAGNOSIS — C186 Malignant neoplasm of descending colon: Secondary | ICD-10-CM | POA: Insufficient documentation

## 2023-12-30 LAB — POCT I-STAT CREATININE: Creatinine, Ser: 0.8 mg/dL (ref 0.44–1.00)

## 2023-12-30 MED ORDER — SODIUM CHLORIDE (PF) 0.9 % IJ SOLN
INTRAMUSCULAR | Status: AC
  Filled 2023-12-30: qty 50

## 2023-12-30 MED ORDER — IOHEXOL 9 MG/ML PO SOLN
500.0000 mL | ORAL | Status: AC
  Administered 2023-12-30 (×2): 500 mL via ORAL

## 2023-12-30 MED ORDER — IOHEXOL 300 MG/ML  SOLN
100.0000 mL | Freq: Once | INTRAMUSCULAR | Status: AC | PRN
  Administered 2023-12-30: 100 mL via INTRAVENOUS

## 2024-01-10 ENCOUNTER — Telehealth: Payer: Self-pay | Admitting: Nurse Practitioner

## 2024-01-10 NOTE — Telephone Encounter (Signed)
 Tammy Marks rescheduled her appointment for 10/15.

## 2024-01-12 ENCOUNTER — Other Ambulatory Visit: Payer: Self-pay

## 2024-01-13 ENCOUNTER — Inpatient Hospital Stay: Admitting: Nurse Practitioner

## 2024-01-13 ENCOUNTER — Inpatient Hospital Stay

## 2024-01-21 ENCOUNTER — Other Ambulatory Visit: Payer: Self-pay | Admitting: Nurse Practitioner

## 2024-01-21 DIAGNOSIS — C186 Malignant neoplasm of descending colon: Secondary | ICD-10-CM

## 2024-01-21 NOTE — Progress Notes (Addendum)
 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  The left descending colon cancer was only 4 mm, removed by polypectomy. The largest 3 cm, fungating mass in the rectosigmoid junction is 15 cm from anal verge on colonoscopy, biopsy confirmed moderate differentiated adenocarcinoma -Her staging CT chest from November 18, 2022 was reviewed, which showed no definitive evidence of metastasis, but showed borderline enlarged thoracic adenopathy. A PET scan was obtained for further evaluation. -Her liver MRI unfortunately showed a 1.2 cm mass in the posterior dome of liver, segment 7, biopsy on 12/03/22 confirmed metastasis from colon cancer, This was reviewed with her.  -Given her metastatic disease, chemotherapy was recommended prior to surgery. She started FOLFOX on 12/18/2022 and received 8 cycles before surgery -she underwent hemicolectomy on 05/03/23 and liver ablation on 05/22/23 by IR  -surgical path reviewed, ypT3N1b, surgical margins negative  -2 more months adjuvant chemo FOLFOX were recommended.  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  She started Xeloda  on June 17, 2023 and completed in early May 2025 -Signatera was positive on 08/13/2023 and CT 09/03/2023 showed enlarged liver met. PET 09/17/2023 showed 2 hypermetabolic liver lesion, consistent with metastasis.  There is also a mild hypermetabolism at the surgical site in the sigmoid colon, Repeat  colonoscopy is recommended. It is scheduled for 11/14/2023.  - 10/15/2023 -partial right hepatectomy with  deep margins negative for carcinoma. -10/29/2023 -repeat Signatera ctDNA drawn at patient's home.  We are awaiting results. - Colonoscopy scheduled on 11/14/2023. -Repeat CT CAP will be scheduled for mid September 2025. -She will follow-up with Dr. Dasie in late August 2025. - CT CAP from 12/30/2023 showed interval partial right hepatectomy with no suspicious nodular enhancements along resection cavities and no new suspicious hepatic lesions.  There was similar soft tissue in the mesentery adjacent to the surgical sutural partial sigmoidectomy and reanastomosis.  Finding is nonspecific but may be reflective of postsurgical changes.  Close attention is suggested upon follow-up. -MR of liver ordered for December 2025.    Anemia Hgb 9.6 and HCT 30.1 today.  Awaiting results of ferritin.  Patient does take oral iron .  Tries to take daily.  Recommend she take with orange juice or vitamin C.  Consider addition of stool softener to prevent constipation and cramping.  May treat with IV iron  at [facility] infusion center if indicated.  Left colon cancer with metastases to the liver Reviewed recent CT CAP with patient which showed no nodular enhancements and no new suspicious hepatic lesions.  There is nodular soft tissue in the mesentery, adjacent to surgical suture line.  There appears to be decreased fat tissue stranding along the pericolic gutter which favors resolving fat necrosis.  Signatera test from July 2025 was negative.  Repeat Signatera testing done today.  Will get MR liver with and without contrast in 2 months to follow-up CT.  Plan This was a shared visit with Dr. Lanny.  Labs reviewed. -Anemia.  Awaiting ferritin results.  Will consider treatment with IV  iron  if indicated. - Unremarkable CMP. - Awaiting results of CEA and Signatera. Reviewed CT CAP showing favorable results. MR liver with  and without contrast ordered for December 2025. Plan for labs and follow-up in 3 months, sooner if needed.  The patient understands the plans discussed today and is in agreement with them.  She knows to contact our office if she develops concerns prior to her next appointment.   Powell FORBES Lessen, NP  [facility] - A DEPT OF [facility] [address] [place] [address] Dept: [phone] Dept Fax: [phone]   Orders Placed This Encounter  Procedures   MR LIVER W WO CONTRAST    Standing Status:   Future    Expected Date:   03/23/2024    Expiration Date:   01/21/2025    If indicated for the ordered procedure, I authorize the administration of contrast media per Radiology protocol:   Yes    What is the patient's sedation requirement?:   No Sedation    Does the patient have a pacemaker or implanted devices?:   No    Preferred imaging location?:   [facility] (table limit - 500lbs)      CHIEF COMPLAINT:  CC: Cancer of left colon  Current Treatment: Surgical intervention and observation  INTERVAL HISTORY:  Keyana is here today for repeat clinical assessment.  She last saw me on 11/12/2023.  She had recently had open hepatic metastectomy on 10/12/2023.  She had clear margins.  She had CT CAP on 12/30/2023.  There was interval partial right hepatectomy for resection of 2 hepatic lesions.  There were no suspicious nodular enhancement along the resection cavities and no new suspicious hepatic lesions were noted.  There was similar soft tissue in the mesentery adjacent to the surgical suture of partial sigmoidectomy and reanastomosis.  This finding is nonspecific and is possibly reflecting postsurgical change with close  attention suggested upon follow-up.  No evidence of metastatic disease in the chest.  She reports feeling fatigued.  Has increased stress at work. She denies chest pain, chest pressure, or shortness of  breath. She denies headaches or visual disturbances. She denies abdominal pain, nausea, vomiting, or changes in bowel or bladder habits.  She no longer has abdominal pain from liver surgery.  Some of the scar remains numb.   She denies fevers or chills. She denies pain. Her appetite is good. Her weight has increased 10 pounds over last 2 months.  I have reviewed the past medical history, past surgical history, social history and family history with the patient and they are unchanged from previous note.  ALLERGIES:  is allergic to oxaliplatin .  MEDICATIONS:  Current Outpatient Medications  Medication Sig Dispense Refill   acetaminophen  (TYLENOL ) 500 MG tablet Take 2 tablets (1,000 mg total) by mouth every 8 (eight) hours as needed for mild pain (pain score 1-3).     Ascorbic Acid (VITAMIN C PO) Take 1 tablet by mouth daily.     atorvastatin  (LIPITOR) 20 MG tablet Take 20 mg by mouth daily.     Berberine Chloride (BERBERINE HCI PO) Take 1 tablet by mouth daily.     docusate sodium  (COLACE) 100 MG capsule Take 1 capsule (100 mg total) by mouth 2 (two) times daily. 60 capsule 0   glipiZIDE  (GLUCOTROL  XL) 10 MG 24 hr tablet Take 10 mg by mouth daily.     Iron , Ferrous Sulfate , 325 (65  Fe) MG TABS Take 325 mg by mouth daily. 30 tablet 11   metFORMIN  (GLUCOPHAGE -XR) 500 MG 24 hr tablet Take 1,000 mg by mouth 2 (two) times daily.     methocarbamol  1000 MG TABS Take 1,000 mg by mouth every 8 (eight) hours as needed for muscle spasms. 25 tablet 0   polyethylene glycol (MIRALAX  / GLYCOLAX ) 17 g packet Take 17 g by mouth daily as needed for mild constipation.     potassium chloride  SA (KLOR-CON  M) 20 MEQ tablet Take 1 tablet (20 mEq total) by mouth 2 (two) times daily. 60 tablet 1   VITAMIN D PO Take 1 tablet by mouth daily.     No current facility-administered medications for this visit.    HISTORY OF PRESENT ILLNESS:   Oncology History  Cancer of left colon (HCC)  11/21/2022 Initial Diagnosis    Cancer of left colon (HCC)   12/18/2022 -  Chemotherapy   Patient is on Treatment Plan : COLORECTAL FOLFOX q14d     12/26/2022 PET scan   NM PET skull base to thigh  IMPRESSION: 1. Two hypermetabolic liver lesions consistent with metastatic disease. 2. Small hypermetabolic celiac axis and sigmoid mesocolon nodes suggesting metastatic adenopathy. 3. No findings for metastatic disease involving the chest or bony structures. 4. Diffuse hypermetabolism throughout the colon possibly related to recently eating or taking insulin  making it difficult to identified the patient's sigmoid cancer.   05/03/2023 Cancer Staging   Staging form: Colon and Rectum, AJCC 8th Edition - Pathologic stage from 05/03/2023: Stage IVA (pT3, pN1b, pM1a) - Signed by Lanny Callander, MD on 06/04/2023 Histologic grading system: 4 grade system Histologic grade (G): G2 Residual tumor (R): R0   12/30/2023 Imaging   CT CAP with contrast  MPRESSION: 1. Interval partial right hepatectomy for resection of 2 hepatic lesions, no suspicious nodular enhancement along the resection cavities. No new suspicious hepatic lesion identified. 2. Prior partial sigmoidectomy with reanastomosis similar nodular soft tissue in the mesentery adjacent to the surgical suture line not definitely seen on prior examination, nonspecific possibly reflecting postsurgical change suggest attention on short-term interval follow-up dedicated CT of the abdomen and pelvis with contrast. 3. No evidence of metastatic disease in the chest. 4. Colonic stool burden compatible with constipation.       REVIEW OF SYSTEMS:   Constitutional: Denies fevers, chills or abnormal weight loss. Fatigue.  Eyes: Denies blurriness of vision Ears, nose, mouth, throat, and face: Denies mucositis or sore throat Respiratory: Denies cough, dyspnea or wheezes Cardiovascular: Denies palpitation, chest discomfort or lower extremity swelling Gastrointestinal:  Denies nausea, heartburn  or change in bowel habits Skin: Denies abnormal skin rashes Lymphatics: Denies new lymphadenopathy or easy bruising Neurological:Denies numbness, tingling or new weaknesses Behavioral/Psych: Mood is stable, no new changes  All other systems were reviewed with the patient and are negative.   VITALS:   Today's Vitals   01/22/24 0856 01/22/24 0919  BP: 130/78   Pulse: 80   Resp: 17   Temp: 97.8 F (36.6 C)   SpO2: 99%   Weight: 280 lb 8 oz (127.2 kg)   PainSc:  0-No pain   Body mass index is 46.68 kg/m.   Wt Readings from Last 3 Encounters:  01/22/24 280 lb 8 oz (127.2 kg)  11/12/23 270 lb 12.8 oz (122.8 kg)  10/15/23 280 lb 6.4 oz (127.2 kg)    Body mass index is 46.68 kg/m.  Performance status (ECOG): 1 - Symptomatic but completely ambulatory  PHYSICAL  EXAM:   GENERAL:alert, no distress and comfortable SKIN: skin color, texture, turgor are normal, no rashes or significant lesions EYES: normal, Conjunctiva are pink and non-injected, sclera clear OROPHARYNX:no exudate, no erythema and lips, buccal mucosa, and tongue normal  NECK: supple, thyroid normal size, non-tender, without nodularity LYMPH:  no palpable lymphadenopathy in the cervical, axillary or inguinal LUNGS: clear to auscultation and percussion with normal breathing effort HEART: regular rate & rhythm and no murmurs and no lower extremity edema ABDOMEN:abdomen soft, non-tender and normal bowel sounds. Well healed surgical scar across the abdomen.  Musculoskeletal:no cyanosis of digits and no clubbing  NEURO: alert & oriented x 3 with fluent speech, no focal motor/sensory deficits  LABORATORY DATA:  I have reviewed the data as listed    Component Value Date/Time   NA 140 01/22/2024 0820   NA 136 05/29/2021 1318   K 3.6 01/22/2024 0820   CL 103 01/22/2024 0820   CO2 28 01/22/2024 0820   GLUCOSE 148 (H) 01/22/2024 0820   BUN 11 01/22/2024 0820   BUN 7 05/29/2021 1318   CREATININE 0.76 01/22/2024 0820    CALCIUM  9.2 01/22/2024 0820   PROT 7.4 01/22/2024 0820   PROT 7.4 05/29/2021 1318   ALBUMIN  3.8 01/22/2024 0820   ALBUMIN  4.1 05/29/2021 1318   AST 13 (L) 01/22/2024 0820   ALT 14 01/22/2024 0820   ALKPHOS 78 01/22/2024 0820   BILITOT 0.3 01/22/2024 0820   GFRNONAA >60 01/22/2024 0820   GFRAA >60 02/24/2019 1647   Lab Results  Component Value Date   WBC 8.0 01/22/2024   NEUTROABS 4.5 01/22/2024   HGB 9.6 (L) 01/22/2024   HCT 30.1 (L) 01/22/2024   MCV 73.2 (L) 01/22/2024   PLT 287 01/22/2024     RADIOGRAPHIC STUDIES: ICT CHEST ABDOMEN PELVIS W CONTRAST Addendum Date: 12/31/2023 ADDENDUM REPORT: 12/31/2023 11:37 ADDENDUM: Decreased soft tissue and fat stranding with intervening macroscopic fat along the left pericolic gutter for instance on image 84/2 favored resolving fat necrosis, suggest continued attention on follow-up imaging. Electronically Signed   By: Reyes Holder M.D.   On: 12/31/2023 11:37   Result Date: 12/31/2023 CLINICAL DATA:  Colon cancer, assess treatment response. * Tracking Code: BO * EXAM: CT CHEST, ABDOMEN, AND PELVIS WITH CONTRAST TECHNIQUE: Multidetector CT imaging of the chest, abdomen and pelvis was performed following the standard protocol during bolus administration of intravenous contrast. RADIATION DOSE REDUCTION: This exam was performed according to the departmental dose-optimization program which includes automated exposure control, adjustment of the mA and/or kV according to patient size and/or use of iterative reconstruction technique. CONTRAST:  OMNIPAQUE  IOHEXOL  300 MG/ML  SOLN COMPARISON:  Multiple priors including CT Sep 03, 2023 and PET-CT September 17, 2023. FINDINGS: CT CHEST FINDINGS Cardiovascular: Right chest Port-A-Cath with tip near the superior cavoatrial junction. Normal size heart. No significant pericardial effusion/thickening. Mediastinum/Nodes: No suspicious thyroid nodule. No pathologically enlarged mediastinal, hilar or axillary lymph  nodes. Lungs/Pleura: No suspicious pulmonary nodules or masses. Bibasilar atelectasis/scarring. Musculoskeletal: No aggressive lytic or blastic lesion of bone. CT ABDOMEN PELVIS FINDINGS Hepatobiliary: Interval partial right hepatectomy for resection of 2 hepatic lesions, no suspicious nodular enhancement along the resection cavities. No new suspicious hepatic lesion identified. Gallbladder surgically absent.  No biliary ductal dilation. Pancreas: No pancreatic ductal dilation or evidence of acute inflammation. Spleen: No splenomegaly. Adrenals/Urinary Tract: No suspicious adrenal nodule/mass. No hydronephrosis. Kidneys demonstrate symmetric enhancement. Urinary bladder is unremarkable for degree of distension. Stomach/Bowel: Stomach is unremarkable for degree  of distension. Colonic stool burden compatible with constipation. Prior partial sigmoidectomy with reanastomosis similar nodular soft tissue in the mesentery adjacent to the surgical suture line on image 102/2 not definitely seen on prior examination. Vascular/Lymphatic: Normal caliber abdominal aorta. Smooth IVC contours. The portal, splenic and superior mesenteric veins are patent. No pathologically enlarged abdominal or pelvic lymph nodes. Reproductive: Probable subserosal fibroid along the uterine fundus similar prior and stable dating back to November 01, 2022. Other: No significant abdominopelvic free fluid. Postsurgical change in the abdominal wall. Musculoskeletal: No aggressive lytic or blastic lesion of bone. Lumbar spondylosis. IMPRESSION: 1. Interval partial right hepatectomy for resection of 2 hepatic lesions, no suspicious nodular enhancement along the resection cavities. No new suspicious hepatic lesion identified. 2. Prior partial sigmoidectomy with reanastomosis similar nodular soft tissue in the mesentery adjacent to the surgical suture line not definitely seen on prior examination, nonspecific possibly reflecting postsurgical change suggest  attention on short-term interval follow-up dedicated CT of the abdomen and pelvis with contrast. 3. No evidence of metastatic disease in the chest. 4. Colonic stool burden compatible with constipation. Electronically Signed: By: Reyes Holder M.D. On: 12/31/2023 10:55   Addendum I have seen the patient, examined her. I agree with the assessment and and plan and have edited the notes.   Morrisa is clinically doing well, I reviewed her CT scan in September 2025 which showed no evidence of cancer recurrence.  Her Signatera in July 2025 was also negative, will repeat again. Will continue cancer surveillance due to her high risk of recurrence.  Plan to obtain liver MRI with and without contrast in 3 months, and continue Signatera test every 3 months.  We discussed what to watch at home, she knows to call us  if needed.  Will see her back after MRI in 3 months.  Onita Mattock MD 01/22/2024

## 2024-01-21 NOTE — Assessment & Plan Note (Signed)
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  The left descending colon cancer was only 4 mm, removed by polypectomy. The largest 3 cm, fungating mass in the rectosigmoid junction is 15 cm from anal verge on colonoscopy, biopsy confirmed moderate differentiated adenocarcinoma -Her staging CT chest from November 18, 2022 was reviewed, which showed no definitive evidence of metastasis, but showed borderline enlarged thoracic adenopathy. A PET scan was obtained for further evaluation. -Her liver MRI unfortunately showed a 1.2 cm mass in the posterior dome of liver, segment 7, biopsy on 12/03/22 confirmed metastasis from colon cancer, This was reviewed with her.  -Given her metastatic disease, chemotherapy was recommended prior to surgery. She started FOLFOX on 12/18/2022 and received 8 cycles before surgery -she underwent hemicolectomy on 05/03/23 and liver ablation on 05/22/23 by IR  -surgical path reviewed, ypT3N1b, surgical margins negative  -2 more months adjuvant chemo FOLFOX were recommended.  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  She started Xeloda  on June 17, 2023 and completed in early May 2025 -Signatera was positive on 08/13/2023 and CT 09/03/2023 showed enlarged liver met. PET 09/17/2023 showed 2 hypermetabolic liver lesion, consistent with metastasis.  There is also a mild hypermetabolism at the surgical site in the sigmoid colon, Repeat colonoscopy is recommended. It is scheduled for 11/14/2023.  - 10/15/2023 -partial right hepatectomy with  deep margins negative for carcinoma. -10/29/2023 -repeat Signatera ctDNA drawn at patient's home.  We are awaiting results. - Colonoscopy scheduled on 11/14/2023. -Repeat CT CAP will be scheduled for mid September 2025. -She will follow-up with Dr. Dasie in late August 2025. - CT CAP from 12/30/2023 showed interval  partial right hepatectomy with no suspicious nodular enhancements along resection cavities and no new suspicious hepatic lesions.  There was similar soft tissue in the mesentery adjacent to the surgical sutural partial sigmoidectomy and reanastomosis.  Finding is nonspecific but may be reflective of postsurgical changes.  Close attention is suggested upon follow-up. -MR of liver ordered for December 2025.

## 2024-01-22 ENCOUNTER — Other Ambulatory Visit: Payer: Self-pay

## 2024-01-22 ENCOUNTER — Ambulatory Visit: Payer: Self-pay | Admitting: Nurse Practitioner

## 2024-01-22 ENCOUNTER — Inpatient Hospital Stay: Admitting: Nurse Practitioner

## 2024-01-22 ENCOUNTER — Inpatient Hospital Stay

## 2024-01-22 ENCOUNTER — Inpatient Hospital Stay: Attending: Hematology

## 2024-01-22 VITALS — BP 130/78 | HR 80 | Temp 97.8°F | Resp 17 | Wt 280.5 lb

## 2024-01-22 DIAGNOSIS — Z85038 Personal history of other malignant neoplasm of large intestine: Secondary | ICD-10-CM | POA: Insufficient documentation

## 2024-01-22 DIAGNOSIS — D649 Anemia, unspecified: Secondary | ICD-10-CM | POA: Insufficient documentation

## 2024-01-22 DIAGNOSIS — C186 Malignant neoplasm of descending colon: Secondary | ICD-10-CM

## 2024-01-22 LAB — CMP (CANCER CENTER ONLY)
ALT: 14 U/L (ref 0–44)
AST: 13 U/L — ABNORMAL LOW (ref 15–41)
Albumin: 3.8 g/dL (ref 3.5–5.0)
Alkaline Phosphatase: 78 U/L (ref 38–126)
Anion gap: 9 (ref 5–15)
BUN: 11 mg/dL (ref 6–20)
CO2: 28 mmol/L (ref 22–32)
Calcium: 9.2 mg/dL (ref 8.9–10.3)
Chloride: 103 mmol/L (ref 98–111)
Creatinine: 0.76 mg/dL (ref 0.44–1.00)
GFR, Estimated: >60 mL/min (ref >60)
Glucose, Bld: 148 mg/dL — ABNORMAL HIGH (ref 70–99)
Potassium: 3.6 mmol/L (ref 3.5–5.1)
Sodium: 140 mmol/L (ref 135–145)
Total Bilirubin: 0.3 mg/dL (ref 0.0–1.2)
Total Protein: 7.4 g/dL (ref 6.5–8.1)

## 2024-01-22 LAB — CBC WITH DIFFERENTIAL (CANCER CENTER ONLY)
Abs Immature Granulocytes: 0.02 K/uL (ref 0.00–0.07)
Basophils Absolute: 0 K/uL (ref 0.0–0.1)
Basophils Relative: 0 %
Eosinophils Absolute: 0.1 K/uL (ref 0.0–0.5)
Eosinophils Relative: 1 %
HCT: 30.1 % — ABNORMAL LOW (ref 36.0–46.0)
Hemoglobin: 9.6 g/dL — ABNORMAL LOW (ref 12.0–15.0)
Immature Granulocytes: 0 %
Lymphocytes Relative: 34 %
Lymphs Abs: 2.7 K/uL (ref 0.7–4.0)
MCH: 23.4 pg — ABNORMAL LOW (ref 26.0–34.0)
MCHC: 31.9 g/dL (ref 30.0–36.0)
MCV: 73.2 fL — ABNORMAL LOW (ref 80.0–100.0)
Monocytes Absolute: 0.6 K/uL (ref 0.1–1.0)
Monocytes Relative: 7 %
Neutro Abs: 4.5 K/uL (ref 1.7–7.7)
Neutrophils Relative %: 58 %
Platelet Count: 287 K/uL (ref 150–400)
RBC: 4.11 MIL/uL (ref 3.87–5.11)
RDW: 16.2 % — ABNORMAL HIGH (ref 11.5–15.5)
WBC Count: 8 K/uL (ref 4.0–10.5)
nRBC: 0 % (ref 0.0–0.2)

## 2024-01-22 LAB — FERRITIN: Ferritin: 66 ng/mL (ref 11–307)

## 2024-01-22 LAB — CEA (ACCESS): CEA (CHCC): <1 ng/mL (ref 0.00–5.00)

## 2024-01-22 LAB — GENETIC SCREENING ORDER

## 2024-01-23 ENCOUNTER — Other Ambulatory Visit: Payer: Self-pay

## 2024-01-28 ENCOUNTER — Other Ambulatory Visit: Payer: Self-pay

## 2024-01-28 LAB — SIGNATERA
SIGNATERA MTM READOUT: 0.08 MTM/ml — AB
SIGNATERA TEST RESULT: POSITIVE — AB

## 2024-01-29 ENCOUNTER — Other Ambulatory Visit: Payer: Self-pay | Admitting: Hematology

## 2024-02-17 ENCOUNTER — Ambulatory Visit: Payer: Self-pay | Admitting: Hematology

## 2024-02-21 ENCOUNTER — Other Ambulatory Visit: Payer: Self-pay

## 2024-02-29 ENCOUNTER — Inpatient Hospital Stay: Attending: Hematology

## 2024-02-29 ENCOUNTER — Ambulatory Visit (HOSPITAL_COMMUNITY)
Admission: RE | Admit: 2024-02-29 | Discharge: 2024-02-29 | Disposition: A | Source: Ambulatory Visit | Attending: Nurse Practitioner

## 2024-02-29 DIAGNOSIS — C787 Secondary malignant neoplasm of liver and intrahepatic bile duct: Secondary | ICD-10-CM | POA: Diagnosis not present

## 2024-02-29 DIAGNOSIS — C186 Malignant neoplasm of descending colon: Secondary | ICD-10-CM | POA: Insufficient documentation

## 2024-02-29 DIAGNOSIS — C19 Malignant neoplasm of rectosigmoid junction: Secondary | ICD-10-CM | POA: Insufficient documentation

## 2024-02-29 MED ORDER — GADOBUTROL 1 MMOL/ML IV SOLN
9.0000 mL | Freq: Once | INTRAVENOUS | Status: AC | PRN
  Administered 2024-02-29: 9 mL via INTRAVENOUS

## 2024-03-02 NOTE — Assessment & Plan Note (Signed)
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  The left descending colon cancer was only 4 mm, removed by polypectomy. The largest 3 cm, fungating mass in the rectosigmoid junction is 15 cm from anal verge on colonoscopy, biopsy confirmed moderate differentiated adenocarcinoma -I reviewed her staging CT chest from November 18, 2022, which showed no definitive evidence of metastasis, but showed borderline enlarged thoracic adenopathy.  I will obtain a PET scan for further evaluation. -Her liver MRI unfortunately showed a 1.2 cm mass in the posterior dome of liver, segment 7, biopsy on 12/03/22 confirmed metastasis from colon cancer, I reviewed with her.  -Given her metastatic disease, I recommend chemotherapy first.  She started FOLFOX on 12/18/2022 and received 8 cycles before surgery -she underwent hemicolectomy on 05/03/23 and liver ablation on 05/22/23 by IR  -surgical path reviewed, ypT3N1b, surgical margins negative  -I recommend 2 more months adjuvant chemo FOLFOX.  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  She started Xeloda  on June 17, 2023 and completed in early May 2025 -Signatera was positive on 08/13/2023 and CT 09/03/2023 showed enlarged liver met. PET 09/17/2023 showed 2 hypermetabolic liver lesion, consistent with metastasis.  There is also a mild hypermetabolism at the surgical site in the sigmoid colon. Her repeated colonoscopy was negative in 11/2023 -she underwent liver resection in 10/2023 by Dr. Dasie  -continue surveillance

## 2024-03-03 ENCOUNTER — Inpatient Hospital Stay (HOSPITAL_BASED_OUTPATIENT_CLINIC_OR_DEPARTMENT_OTHER): Admitting: Hematology

## 2024-03-03 DIAGNOSIS — C186 Malignant neoplasm of descending colon: Secondary | ICD-10-CM

## 2024-03-03 NOTE — Progress Notes (Signed)
 [facility]   Telephone:(336) [phone] Fax:(336) [phone]   Clinic Follow up Note   Patient Care Team: Early, [name], NP as PCP - General (Nurse Practitioner) Lanny Callander, MD as Consulting Physician (Hematology and Oncology) Debby Hila, MD as Consulting Physician (General Surgery) Kriss Estefana DEL, DO as Consulting Physician (Gastroenterology) 03/03/2024  I connected with Tammy Marks on 03/03/24 at  8:45 AM EST by telephone and verified that I am speaking with the correct person using two identifiers.   I discussed the limitations, risks, security and privacy concerns of performing an evaluation and management service by telephone and the availability of in person appointments. I also discussed with the patient that there may be a patient responsible charge related to this service. The patient expressed understanding and agreed to proceed.   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  The left descending colon cancer was only 4 mm, removed by polypectomy. The largest 3 cm, fungating mass in the rectosigmoid junction is 15 cm from anal verge on colonoscopy, biopsy confirmed moderate differentiated adenocarcinoma -I reviewed her staging CT chest from November 18, 2022, which showed no definitive evidence of metastasis, but showed borderline enlarged thoracic adenopathy.  I will obtain a PET scan for further evaluation. -Her liver MRI unfortunately showed a 1.2 cm mass in the posterior dome of liver, segment 7, biopsy on 12/03/22 confirmed metastasis from colon cancer, I reviewed with her.  -Given her metastatic disease, I recommend chemotherapy first.  She started FOLFOX on 12/18/2022 and received 8 cycles before  surgery -she underwent hemicolectomy on 05/03/23 and liver ablation on 05/22/23 by IR  -surgical path reviewed, ypT3N1b, surgical margins negative  -I recommend 2 more months adjuvant chemo FOLFOX.  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  She started Xeloda  on June 17, 2023 and completed in early May 2025 -Signatera was positive on 08/13/2023 and CT 09/03/2023 showed enlarged liver met. PET 09/17/2023 showed 2 hypermetabolic liver lesion, consistent with metastasis.  There is also a mild hypermetabolism at the surgical site in the sigmoid colon. Her repeated colonoscopy was negative in 11/2023 -she underwent liver resection in 10/2023 by Dr. Dasie  -continue surveillance    Assessment & Plan Metastatic colon cancer under surveillance post-liver metastasectomy Recent Signatera test on October 21st showed a positive result with a lower quantitative value compared to six months ago, indicating a reduced presence of circulating tumor DNA (ctDNA). No signs of cancer recurrence in the liver on recent imaging. Last chemotherapy was in May 2025, and liver surgery was performed on July 8th, 2025. Currently asymptomatic with no pain or discomfort. - Continue surveillance with regular monitoring. - Will repeat Signatera test in 1-2 months  -I think it's reasonable to get a PET scan in early December 2025 if desired, to evaluate for potential metastasis elsewhere. She is moving now and will let me know if she is able to do it or wait  - Will refer to a local oncologist in [place], [place] , for continued care post-relocation. -will cancel her appointments in Jan 2026 due to her relocation   Plan - I reviewed her liver MRI images and discussed the findings with her, no  evidence of liver recurrence. - I recommend to obtain a PET scan in December for further evaluation, she is relocating to [place]  [place], wants to think about it and let me know if  she wants to do the PET here before she moves out, or wait until she gets to [place] . - Will help her to find a local oncologist in [place]  and transfer her care.   SUMMARY OF ONCOLOGIC HISTORY: Oncology History  Cancer of left colon (HCC)  11/21/2022 Initial Diagnosis   Cancer of left colon (HCC)   12/18/2022 -  Chemotherapy   Patient is on Treatment Plan : COLORECTAL FOLFOX q14d     12/26/2022 PET scan   NM PET skull base to thigh  IMPRESSION: 1. Two hypermetabolic liver lesions consistent with metastatic disease. 2. Small hypermetabolic celiac axis and sigmoid mesocolon nodes suggesting metastatic adenopathy. 3. No findings for metastatic disease involving the chest or bony structures. 4. Diffuse hypermetabolism throughout the colon possibly related to recently eating or taking insulin  making it difficult to identified the patient's sigmoid cancer.   05/03/2023 Cancer Staging   Staging form: Colon and Rectum, AJCC 8th Edition - Pathologic stage from 05/03/2023: Stage IVA (pT3, pN1b, pM1a) - Signed by Lanny Callander, MD on 06/04/2023 Histologic grading system: 4 grade system Histologic grade (G): G2 Residual tumor (R): R0   12/30/2023 Imaging   CT CAP with contrast  MPRESSION: 1. Interval partial right hepatectomy for resection of 2 hepatic lesions, no suspicious nodular enhancement along the resection cavities. No new suspicious hepatic lesion identified. 2. Prior partial sigmoidectomy with reanastomosis similar nodular soft tissue in the mesentery adjacent to the surgical suture line not definitely seen on prior examination, nonspecific possibly reflecting postsurgical change suggest attention on short-term interval follow-up dedicated CT of the abdomen and pelvis with contrast. 3. No evidence of metastatic disease in the chest. 4. Colonic stool burden compatible with constipation.     Discussed the use of AI scribe software for clinical note transcription with the patient, who gave  verbal consent to proceed.  History of Present Illness Tammy Marks is a 58 year old female with metastatic colon cancer who presents for follow-up.  She is asymptomatic with no pain or discomfort. Her last chemotherapy was in May 2025 and she had liver surgery on October 15, 2023.  Her Signatera test on January 28, 2024 was positive but quantitatively lower than six months prior. She had an MRI three days ago.  She feels well overall without pain or other complaints.     REVIEW OF SYSTEMS:   Constitutional: Denies fevers, chills or abnormal weight loss Eyes: Denies blurriness of vision Ears, nose, mouth, throat, and face: Denies mucositis or sore throat Respiratory: Denies cough, dyspnea or wheezes Cardiovascular: Denies palpitation, chest discomfort or lower extremity swelling Gastrointestinal:  Denies nausea, heartburn or change in bowel habits Skin: Denies abnormal skin rashes Lymphatics: Denies new lymphadenopathy or easy bruising Neurological:Denies numbness, tingling or new weaknesses Behavioral/Psych: Mood is stable, no new changes  All other systems were reviewed with the patient and are negative.  MEDICAL HISTORY:  Past Medical History:  Diagnosis Date   Anemia    Cancer (HCC)    Colon cancer (HCC) 10/30/2022   surgery 05/03/23   Diabetes mellitus without complication [facility])    Personal history of chemotherapy 2024   colon ca    SURGICAL HISTORY: Past Surgical History:  Procedure Laterality Date   CESAREAN SECTION     CHOLECYSTECTOMY  IR IMAGING GUIDED PORT INSERTION  12/03/2022   IR RADIOLOGIST EVAL & MGMT  03/27/2023   IR RADIOLOGIST EVAL & MGMT  06/26/2023   IR US  LIVER BIOPSY  12/03/2022   LAPAROSCOPY N/A 10/15/2023   Procedure: LAPAROSCOPY, DIAGNOSTIC;  Surgeon: Dasie Leonor CROME, MD;  Location: [facility] OR;  Service: General;  Laterality: N/A;  STAGING LAPAROSCOPY WITH INTRAOPERATIVE ULTRASOUND   OPEN PARTIAL HEPATECTOMY  N/A 10/15/2023   Procedure: HEPATECTOMY,  PARTIAL, OPEN;  Surgeon: Dasie Leonor CROME, MD;  Location: [facility] OR;  Service: General;  Laterality: N/A;  OPEN PARTIAL RIGHT HEPATECTOMY   OPERATIVE ULTRASOUND N/A 10/15/2023   Procedure: US  INTRAOPERATIVE;  Surgeon: Dasie Leonor CROME, MD;  Location: [facility] OR;  Service: General;  Laterality: N/A;   RADIOLOGY WITH ANESTHESIA N/A 05/22/2023   Procedure: CT MICROWAVE ABLATION WITH ANESTHESIA;  Surgeon: Alona Corners, DO;  Location: [facility] ORS;  Service: Anesthesiology;  Laterality: N/A;    I have reviewed the social history and family history with the patient and they are unchanged from previous note.  ALLERGIES:  is allergic to oxaliplatin .  MEDICATIONS:  Current Outpatient Medications  Medication Sig Dispense Refill   acetaminophen  (TYLENOL ) 500 MG tablet Take 2 tablets (1,000 mg total) by mouth every 8 (eight) hours as needed for mild pain (pain score 1-3).     Ascorbic Acid (VITAMIN C PO) Take 1 tablet by mouth daily.     atorvastatin  (LIPITOR) 20 MG tablet Take 20 mg by mouth daily.     Berberine Chloride (BERBERINE HCI PO) Take 1 tablet by mouth daily.     docusate sodium  (COLACE) 100 MG capsule Take 1 capsule (100 mg total) by mouth 2 (two) times daily. 60 capsule 0   gabapentin  (NEURONTIN ) 100 MG capsule TAKE 1 CAPSULE BY MOUTH ONCE OR TWICE DAILY DURING THE DAY AND 2 TO 3 CAPSULES AT BEDTIME 90 capsule 1   glipiZIDE  (GLUCOTROL  XL) 10 MG 24 hr tablet Take 10 mg by mouth daily.     Iron , Ferrous Sulfate , 325 (65 Fe) MG TABS Take 325 mg by mouth daily. 30 tablet 11   metFORMIN  (GLUCOPHAGE -XR) 500 MG 24 hr tablet Take 1,000 mg by mouth 2 (two) times daily.     methocarbamol  1000 MG TABS Take 1,000 mg by mouth every 8 (eight) hours as needed for muscle spasms. 25 tablet 0   polyethylene glycol (MIRALAX  / GLYCOLAX ) 17 g packet Take 17 g by mouth daily as needed for mild constipation.     potassium chloride  SA (KLOR-CON  M) 20 MEQ tablet Take 1 tablet (20 mEq total) by mouth 2 (two) times daily. 60 tablet 1    VITAMIN D PO Take 1 tablet by mouth daily.     No current facility-administered medications for this visit.    PHYSICAL EXAMINATION: Not performed   LABORATORY DATA:  I have reviewed the data as listed    Latest Ref Rng & Units 01/22/2024    8:20 AM 11/12/2023    7:57 AM 10/17/2023    8:15 AM  CBC  WBC 4.0 - 10.5 K/uL 8.0  8.9  11.0   Hemoglobin 12.0 - 15.0 g/dL 9.6  89.8  8.8   Hematocrit 36.0 - 46.0 % 30.1  31.3  26.8   Platelets 150 - 400 K/uL 287  427  236         Latest Ref Rng & Units 01/22/2024    8:20 AM 12/30/2023    4:52 PM 11/12/2023    7:57 AM  CMP  Glucose 70 - 99 mg/dL 851   868   BUN 6 - 20 mg/dL 11   19   Creatinine 9.55 - 1.00 mg/dL 9.23  9.19  9.17   Sodium 135 - 145 mmol/L 140   139   Potassium 3.5 - 5.1 mmol/L 3.6   4.1   Chloride 98 - 111 mmol/L 103   102   CO2 22 - 32 mmol/L 28   26   Calcium  8.9 - 10.3 mg/dL 9.2   9.5   Total Protein 6.5 - 8.1 g/dL 7.4   8.1   Total Bilirubin 0.0 - 1.2 mg/dL 0.3   0.3   Alkaline Phos 38 - 126 U/L 78   86   AST 15 - 41 U/L 13   13   ALT 0 - 44 U/L 14   13       RADIOGRAPHIC STUDIES: I have personally reviewed the radiological images as listed and agreed with the findings in the report. No results found.     I discussed the assessment and treatment plan with the patient. The patient was provided an opportunity to ask questions and all were answered. The patient agreed with the plan and demonstrated an understanding of the instructions.   The patient was advised to call back or seek an in-person evaluation if the symptoms worsen or if the condition fails to improve as anticipated.  I provided 25 minutes of non face-to-face telephone visit time during this encounter, including review of chart and various tests results, discussions about plan of care and coordination of care plan.    Onita Mattock, MD 03/03/24

## 2024-03-04 ENCOUNTER — Other Ambulatory Visit: Payer: Self-pay

## 2024-03-06 ENCOUNTER — Other Ambulatory Visit: Payer: Self-pay | Admitting: Hematology

## 2024-03-06 ENCOUNTER — Other Ambulatory Visit: Payer: Self-pay

## 2024-03-06 DIAGNOSIS — C186 Malignant neoplasm of descending colon: Secondary | ICD-10-CM

## 2024-03-09 ENCOUNTER — Telehealth: Payer: Self-pay

## 2024-03-09 ENCOUNTER — Other Ambulatory Visit: Payer: Self-pay

## 2024-03-09 DIAGNOSIS — C186 Malignant neoplasm of descending colon: Secondary | ICD-10-CM

## 2024-03-09 NOTE — Telephone Encounter (Signed)
 Per Dr. Lanny Faxed [phone] page) referral packet to [place]  [facility] - [place], [place] Telephone# [phone] Fax# [phone] Confirmation received.

## 2024-03-13 ENCOUNTER — Other Ambulatory Visit: Payer: Self-pay

## 2024-03-13 DIAGNOSIS — C186 Malignant neoplasm of descending colon: Secondary | ICD-10-CM

## 2024-03-14 ENCOUNTER — Inpatient Hospital Stay

## 2024-03-17 ENCOUNTER — Ambulatory Visit

## 2024-03-17 ENCOUNTER — Ambulatory Visit: Admission: RE | Admit: 2024-03-17 | Discharge: 2024-03-17 | Attending: Hematology

## 2024-03-17 DIAGNOSIS — C186 Malignant neoplasm of descending colon: Secondary | ICD-10-CM

## 2024-03-17 LAB — GLUCOSE, CAPILLARY: Glucose-Capillary: 111 mg/dL — ABNORMAL HIGH (ref 70–99)

## 2024-03-17 MED ORDER — FLUDEOXYGLUCOSE F - 18 (FDG) INJECTION
12.0000 | Freq: Once | INTRAVENOUS | Status: AC | PRN
  Administered 2024-03-17: 13.03 via INTRAVENOUS

## 2024-03-20 ENCOUNTER — Other Ambulatory Visit (HOSPITAL_COMMUNITY)

## 2024-03-23 ENCOUNTER — Other Ambulatory Visit: Payer: Self-pay

## 2024-03-24 ENCOUNTER — Inpatient Hospital Stay: Admitting: Hematology

## 2024-03-30 ENCOUNTER — Telehealth: Payer: Self-pay | Admitting: Hematology

## 2024-03-31 ENCOUNTER — Telehealth: Payer: Self-pay

## 2024-03-31 ENCOUNTER — Inpatient Hospital Stay: Attending: Hematology | Admitting: Hematology

## 2024-03-31 DIAGNOSIS — C186 Malignant neoplasm of descending colon: Secondary | ICD-10-CM

## 2024-03-31 NOTE — Telephone Encounter (Signed)
 Patient relocated & transferred care to [place]  [facility]. [place]  [facility] is requesting patient's PET, MRI, and CT images/reports. Reached out to the patient via telephone call @T [phone], per Dr. Lanny. Provided patient with the contact info for HIM: [phone]. Patient voiced understanding.

## 2024-03-31 NOTE — Assessment & Plan Note (Signed)
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  The left descending colon cancer was only 4 mm, removed by polypectomy. The largest 3 cm, fungating mass in the rectosigmoid junction is 15 cm from anal verge on colonoscopy, biopsy confirmed moderate differentiated adenocarcinoma -I reviewed her staging CT chest from November 18, 2022, which showed no definitive evidence of metastasis, but showed borderline enlarged thoracic adenopathy.  I will obtain a PET scan for further evaluation. -Her liver MRI unfortunately showed a 1.2 cm mass in the posterior dome of liver, segment 7, biopsy on 12/03/22 confirmed metastasis from colon cancer, I reviewed with her.  -Given her metastatic disease, I recommend chemotherapy first.  She started FOLFOX on 12/18/2022 and received 8 cycles before surgery -she underwent hemicolectomy on 05/03/23 and liver ablation on 05/22/23 by IR  -surgical path reviewed, ypT3N1b, surgical margins negative  -I recommend 2 more months adjuvant chemo FOLFOX.  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  She started Xeloda  on June 17, 2023 and completed in early May 2025 -Signatera was positive on 08/13/2023 and CT 09/03/2023 showed enlarged liver met. PET 09/17/2023 showed 2 hypermetabolic liver lesion, consistent with metastasis.  There is also a mild hypermetabolism at the surgical site in the sigmoid colon. Her repeated colonoscopy was negative in 11/2023 -she underwent liver resection in 10/2023 by Dr. Dasie  -she is on surveillance  -PET 03/17/2024 showed a small amount of residual or recurrent hypermetabolic activity posteriorly in segment 7, suspicious for residual/recurrent disease in the setting of a recent positive Signatera test. There is also a new small hypermetabolic lesion in the dome of the right lobe, suspicious for a new metastasis

## 2024-03-31 NOTE — Progress Notes (Signed)
 Open in error

## 2024-04-23 ENCOUNTER — Inpatient Hospital Stay: Admitting: Hematology

## 2024-04-23 ENCOUNTER — Inpatient Hospital Stay
